# Patient Record
Sex: Female | Born: 1975 | Race: White | Hispanic: No | Smoking: Current every day smoker
Health system: Southern US, Community
[De-identification: ages and names within clinical notes are randomized; demographics above are authoritative.]

## PROBLEM LIST (undated history)

## (undated) ENCOUNTER — Inpatient Hospital Stay (HOSPITAL_COMMUNITY): Payer: Self-pay

## (undated) DIAGNOSIS — K5090 Crohn's disease, unspecified, without complications: Secondary | ICD-10-CM

## (undated) DIAGNOSIS — E1110 Type 2 diabetes mellitus with ketoacidosis without coma: Secondary | ICD-10-CM

## (undated) DIAGNOSIS — F419 Anxiety disorder, unspecified: Secondary | ICD-10-CM

## (undated) DIAGNOSIS — I10 Essential (primary) hypertension: Secondary | ICD-10-CM

## (undated) DIAGNOSIS — G4700 Insomnia, unspecified: Secondary | ICD-10-CM

## (undated) DIAGNOSIS — G629 Polyneuropathy, unspecified: Secondary | ICD-10-CM

## (undated) HISTORY — DX: Anxiety disorder, unspecified: F41.9

## (undated) HISTORY — PX: COLONOSCOPY: SHX174

## (undated) HISTORY — PX: HERNIA REPAIR: SHX51

## (undated) HISTORY — PX: RECTAL SURGERY: SHX760

## (undated) HISTORY — DX: Insomnia, unspecified: G47.00

## (undated) HISTORY — DX: Polyneuropathy, unspecified: G62.9

## (undated) HISTORY — PX: OTHER SURGICAL HISTORY: SHX169

---

## 2000-06-10 ENCOUNTER — Encounter: Payer: Self-pay | Admitting: *Deleted

## 2000-06-10 ENCOUNTER — Inpatient Hospital Stay (HOSPITAL_COMMUNITY): Admission: AD | Admit: 2000-06-10 | Discharge: 2000-06-10 | Payer: Self-pay | Admitting: *Deleted

## 2000-06-26 ENCOUNTER — Encounter: Payer: Self-pay | Admitting: Emergency Medicine

## 2000-06-26 ENCOUNTER — Emergency Department (HOSPITAL_COMMUNITY): Admission: EM | Admit: 2000-06-26 | Discharge: 2000-06-26 | Payer: Self-pay | Admitting: Emergency Medicine

## 2000-07-10 ENCOUNTER — Other Ambulatory Visit: Admission: RE | Admit: 2000-07-10 | Discharge: 2000-07-10 | Payer: Self-pay | Admitting: Obstetrics and Gynecology

## 2000-08-07 ENCOUNTER — Encounter: Payer: Self-pay | Admitting: Obstetrics and Gynecology

## 2000-08-07 ENCOUNTER — Ambulatory Visit (HOSPITAL_COMMUNITY): Admission: RE | Admit: 2000-08-07 | Discharge: 2000-08-07 | Payer: Self-pay | Admitting: Obstetrics and Gynecology

## 2000-10-06 ENCOUNTER — Encounter: Payer: Self-pay | Admitting: Obstetrics and Gynecology

## 2000-10-06 ENCOUNTER — Ambulatory Visit (HOSPITAL_COMMUNITY): Admission: RE | Admit: 2000-10-06 | Discharge: 2000-10-06 | Payer: Self-pay | Admitting: Obstetrics and Gynecology

## 2000-11-14 ENCOUNTER — Inpatient Hospital Stay (HOSPITAL_COMMUNITY): Admission: AD | Admit: 2000-11-14 | Discharge: 2000-11-14 | Payer: Self-pay | Admitting: *Deleted

## 2000-11-24 ENCOUNTER — Ambulatory Visit (HOSPITAL_COMMUNITY): Admission: RE | Admit: 2000-11-24 | Discharge: 2000-11-24 | Payer: Self-pay | Admitting: Obstetrics and Gynecology

## 2000-11-24 ENCOUNTER — Encounter: Payer: Self-pay | Admitting: Obstetrics and Gynecology

## 2000-12-11 ENCOUNTER — Ambulatory Visit (HOSPITAL_COMMUNITY): Admission: RE | Admit: 2000-12-11 | Discharge: 2000-12-11 | Payer: Self-pay | Admitting: Obstetrics and Gynecology

## 2000-12-11 ENCOUNTER — Encounter: Payer: Self-pay | Admitting: Obstetrics and Gynecology

## 2000-12-26 ENCOUNTER — Encounter: Payer: Self-pay | Admitting: Obstetrics and Gynecology

## 2000-12-26 ENCOUNTER — Inpatient Hospital Stay (HOSPITAL_COMMUNITY): Admission: AD | Admit: 2000-12-26 | Discharge: 2000-12-26 | Payer: Self-pay | Admitting: Obstetrics and Gynecology

## 2000-12-29 ENCOUNTER — Inpatient Hospital Stay (HOSPITAL_COMMUNITY): Admission: AD | Admit: 2000-12-29 | Discharge: 2000-12-30 | Payer: Self-pay | Admitting: Obstetrics and Gynecology

## 2001-09-06 ENCOUNTER — Emergency Department (HOSPITAL_COMMUNITY): Admission: EM | Admit: 2001-09-06 | Discharge: 2001-09-06 | Payer: Self-pay | Admitting: Emergency Medicine

## 2001-10-11 ENCOUNTER — Inpatient Hospital Stay (HOSPITAL_COMMUNITY): Admission: AD | Admit: 2001-10-11 | Discharge: 2001-10-11 | Payer: Self-pay | Admitting: *Deleted

## 2001-10-12 ENCOUNTER — Encounter: Payer: Self-pay | Admitting: Obstetrics and Gynecology

## 2001-12-18 ENCOUNTER — Emergency Department (HOSPITAL_COMMUNITY): Admission: EM | Admit: 2001-12-18 | Discharge: 2001-12-18 | Payer: Self-pay | Admitting: Emergency Medicine

## 2001-12-24 ENCOUNTER — Emergency Department (HOSPITAL_COMMUNITY): Admission: EM | Admit: 2001-12-24 | Discharge: 2001-12-24 | Payer: Self-pay | Admitting: Emergency Medicine

## 2001-12-26 ENCOUNTER — Emergency Department (HOSPITAL_COMMUNITY): Admission: EM | Admit: 2001-12-26 | Discharge: 2001-12-26 | Payer: Self-pay | Admitting: Emergency Medicine

## 2002-12-15 ENCOUNTER — Emergency Department (HOSPITAL_COMMUNITY): Admission: EM | Admit: 2002-12-15 | Discharge: 2002-12-15 | Payer: Self-pay | Admitting: Emergency Medicine

## 2003-01-25 ENCOUNTER — Emergency Department (HOSPITAL_COMMUNITY): Admission: EM | Admit: 2003-01-25 | Discharge: 2003-01-26 | Payer: Self-pay | Admitting: Emergency Medicine

## 2003-01-26 ENCOUNTER — Encounter: Payer: Self-pay | Admitting: Emergency Medicine

## 2003-01-29 ENCOUNTER — Emergency Department (HOSPITAL_COMMUNITY): Admission: EM | Admit: 2003-01-29 | Discharge: 2003-01-29 | Payer: Self-pay | Admitting: Emergency Medicine

## 2003-05-26 ENCOUNTER — Inpatient Hospital Stay (HOSPITAL_COMMUNITY): Admission: AD | Admit: 2003-05-26 | Discharge: 2003-05-26 | Payer: Self-pay | Admitting: Obstetrics and Gynecology

## 2004-02-04 ENCOUNTER — Emergency Department (HOSPITAL_COMMUNITY): Admission: EM | Admit: 2004-02-04 | Discharge: 2004-02-04 | Payer: Self-pay | Admitting: *Deleted

## 2004-05-11 ENCOUNTER — Emergency Department (HOSPITAL_COMMUNITY): Admission: EM | Admit: 2004-05-11 | Discharge: 2004-05-11 | Payer: Self-pay | Admitting: Family Medicine

## 2004-06-25 ENCOUNTER — Emergency Department (HOSPITAL_COMMUNITY): Admission: EM | Admit: 2004-06-25 | Discharge: 2004-06-25 | Payer: Self-pay | Admitting: Family Medicine

## 2004-07-01 ENCOUNTER — Emergency Department (HOSPITAL_COMMUNITY): Admission: EM | Admit: 2004-07-01 | Discharge: 2004-07-01 | Payer: Self-pay | Admitting: Family Medicine

## 2004-07-27 ENCOUNTER — Emergency Department (HOSPITAL_COMMUNITY): Admission: EM | Admit: 2004-07-27 | Discharge: 2004-07-27 | Payer: Self-pay | Admitting: Family Medicine

## 2004-10-27 ENCOUNTER — Emergency Department (HOSPITAL_COMMUNITY): Admission: EM | Admit: 2004-10-27 | Discharge: 2004-10-27 | Payer: Self-pay | Admitting: Family Medicine

## 2006-10-16 ENCOUNTER — Emergency Department (HOSPITAL_COMMUNITY): Admission: EM | Admit: 2006-10-16 | Discharge: 2006-10-16 | Payer: Self-pay

## 2008-11-06 ENCOUNTER — Emergency Department (HOSPITAL_COMMUNITY): Admission: EM | Admit: 2008-11-06 | Discharge: 2008-11-06 | Payer: Self-pay | Admitting: Emergency Medicine

## 2008-11-09 ENCOUNTER — Observation Stay (HOSPITAL_COMMUNITY): Admission: EM | Admit: 2008-11-09 | Discharge: 2008-11-10 | Payer: Self-pay | Admitting: Emergency Medicine

## 2009-05-09 ENCOUNTER — Emergency Department (HOSPITAL_COMMUNITY): Admission: EM | Admit: 2009-05-09 | Discharge: 2009-05-09 | Payer: Self-pay | Admitting: Emergency Medicine

## 2009-11-30 ENCOUNTER — Emergency Department (HOSPITAL_COMMUNITY): Admission: EM | Admit: 2009-11-30 | Discharge: 2009-11-30 | Payer: Self-pay | Admitting: Emergency Medicine

## 2010-01-11 ENCOUNTER — Emergency Department (HOSPITAL_COMMUNITY)
Admission: EM | Admit: 2010-01-11 | Discharge: 2010-01-11 | Payer: Self-pay | Source: Home / Self Care | Admitting: Emergency Medicine

## 2010-02-22 ENCOUNTER — Emergency Department (HOSPITAL_COMMUNITY)
Admission: EM | Admit: 2010-02-22 | Discharge: 2010-02-22 | Payer: Self-pay | Source: Home / Self Care | Admitting: Emergency Medicine

## 2010-02-23 ENCOUNTER — Emergency Department (HOSPITAL_COMMUNITY)
Admission: EM | Admit: 2010-02-23 | Discharge: 2010-02-23 | Payer: Self-pay | Source: Home / Self Care | Admitting: Emergency Medicine

## 2010-03-11 ENCOUNTER — Ambulatory Visit: Payer: Self-pay | Admitting: Obstetrics & Gynecology

## 2010-03-11 ENCOUNTER — Inpatient Hospital Stay (HOSPITAL_COMMUNITY): Admission: AD | Admit: 2010-03-11 | Discharge: 2010-03-11 | Payer: Self-pay | Admitting: Obstetrics & Gynecology

## 2010-05-05 ENCOUNTER — Encounter: Admission: RE | Admit: 2010-05-05 | Discharge: 2010-05-05 | Payer: Self-pay | Admitting: Gastroenterology

## 2010-08-03 ENCOUNTER — Emergency Department (HOSPITAL_COMMUNITY)
Admission: EM | Admit: 2010-08-03 | Discharge: 2010-08-04 | Discharge status: Against Medical Advice | Payer: Medicaid Other | Attending: Emergency Medicine | Admitting: Emergency Medicine

## 2010-08-03 DIAGNOSIS — R109 Unspecified abdominal pain: Secondary | ICD-10-CM | POA: Insufficient documentation

## 2010-08-03 DIAGNOSIS — N390 Urinary tract infection, site not specified: Secondary | ICD-10-CM | POA: Insufficient documentation

## 2010-08-03 DIAGNOSIS — K219 Gastro-esophageal reflux disease without esophagitis: Secondary | ICD-10-CM | POA: Insufficient documentation

## 2010-08-03 DIAGNOSIS — Z79899 Other long term (current) drug therapy: Secondary | ICD-10-CM | POA: Insufficient documentation

## 2010-08-03 DIAGNOSIS — R339 Retention of urine, unspecified: Secondary | ICD-10-CM | POA: Insufficient documentation

## 2010-08-03 DIAGNOSIS — K6289 Other specified diseases of anus and rectum: Secondary | ICD-10-CM | POA: Insufficient documentation

## 2010-08-03 DIAGNOSIS — I10 Essential (primary) hypertension: Secondary | ICD-10-CM | POA: Insufficient documentation

## 2010-08-03 DIAGNOSIS — K5090 Crohn's disease, unspecified, without complications: Secondary | ICD-10-CM | POA: Insufficient documentation

## 2010-08-03 DIAGNOSIS — R10819 Abdominal tenderness, unspecified site: Secondary | ICD-10-CM | POA: Insufficient documentation

## 2010-08-03 DIAGNOSIS — K612 Anorectal abscess: Secondary | ICD-10-CM | POA: Insufficient documentation

## 2010-08-04 ENCOUNTER — Encounter (HOSPITAL_COMMUNITY): Payer: Self-pay | Admitting: Radiology

## 2010-08-04 ENCOUNTER — Emergency Department (HOSPITAL_COMMUNITY): Payer: Medicaid Other

## 2010-08-04 ENCOUNTER — Inpatient Hospital Stay (HOSPITAL_COMMUNITY)
Admission: EM | Admit: 2010-08-04 | Discharge: 2010-08-05 | DRG: 348 | Disposition: A | Discharge status: Home / Self Care | Payer: Medicaid Other | Attending: Surgery | Admitting: Surgery

## 2010-08-04 DIAGNOSIS — K612 Anorectal abscess: Principal | ICD-10-CM | POA: Diagnosis present

## 2010-08-04 DIAGNOSIS — K5090 Crohn's disease, unspecified, without complications: Secondary | ICD-10-CM | POA: Diagnosis present

## 2010-08-04 DIAGNOSIS — Z6839 Body mass index (BMI) 39.0-39.9, adult: Secondary | ICD-10-CM

## 2010-08-04 DIAGNOSIS — F17200 Nicotine dependence, unspecified, uncomplicated: Secondary | ICD-10-CM | POA: Diagnosis present

## 2010-08-04 DIAGNOSIS — E119 Type 2 diabetes mellitus without complications: Secondary | ICD-10-CM | POA: Diagnosis present

## 2010-08-04 DIAGNOSIS — I10 Essential (primary) hypertension: Secondary | ICD-10-CM | POA: Diagnosis present

## 2010-08-04 HISTORY — DX: Essential (primary) hypertension: I10

## 2010-08-04 HISTORY — DX: Crohn's disease, unspecified, without complications: K50.90

## 2010-08-04 LAB — GLUCOSE, CAPILLARY
Glucose-Capillary: 194 mg/dL — ABNORMAL HIGH (ref 70–99)
Glucose-Capillary: 126 mg/dL — ABNORMAL HIGH (ref 70–99)
Glucose-Capillary: 168 mg/dL — ABNORMAL HIGH (ref 70–99)

## 2010-08-04 LAB — URINALYSIS, ROUTINE W REFLEX MICROSCOPIC
Bilirubin Urine: NEGATIVE
Leukocytes, UA: NEGATIVE
Nitrite: POSITIVE — AB
Protein, ur: 100 mg/dL — AB
Specific Gravity, Urine: 1.033 — ABNORMAL HIGH (ref 1.005–1.030)
Urine Glucose, Fasting: 250 mg/dL — AB
Urobilinogen, UA: 0.2 mg/dL (ref 0.0–1.0)
pH: 5 (ref 5.0–8.0)

## 2010-08-04 LAB — COMPREHENSIVE METABOLIC PANEL
ALT: 14 U/L (ref 0–35)
AST: 19 U/L (ref 0–37)
Albumin: 4.1 g/dL (ref 3.5–5.2)
Alkaline Phosphatase: 60 U/L (ref 39–117)
BUN: 10 mg/dL (ref 6–23)
CO2: 21 mEq/L (ref 19–32)
Calcium: 9.3 mg/dL (ref 8.4–10.5)
Chloride: 104 mEq/L (ref 96–112)
Creatinine, Ser: 0.77 mg/dL (ref 0.4–1.2)
GFR calc Af Amer: >60 mL/min (ref >60)
GFR calc non Af Amer: >60 mL/min (ref >60)
Glucose, Bld: 185 mg/dL — ABNORMAL HIGH (ref 70–99)
Potassium: 3.6 mEq/L (ref 3.5–5.1)
Sodium: 136 mEq/L (ref 135–145)
Total Bilirubin: 0.8 mg/dL (ref 0.3–1.2)
Total Protein: 7.5 g/dL (ref 6.0–8.3)

## 2010-08-04 LAB — DIFFERENTIAL
Basophils Absolute: 0 10*3/uL (ref 0.0–0.1)
Basophils Relative: 0 % (ref 0–1)
Eosinophils Absolute: 0.1 10*3/uL (ref 0.0–0.7)
Eosinophils Relative: 1 % (ref 0–5)
Lymphocytes Relative: 16 % (ref 12–46)
Lymphs Abs: 2.6 10*3/uL (ref 0.7–4.0)
Monocytes Absolute: 0.9 10*3/uL (ref 0.1–1.0)
Monocytes Relative: 6 % (ref 3–12)
Neutro Abs: 12.3 10*3/uL — ABNORMAL HIGH (ref 1.7–7.7)
Neutrophils Relative %: 77 % (ref 43–77)

## 2010-08-04 LAB — CBC
HCT: 42 % (ref 36.0–46.0)
Hemoglobin: 14.7 g/dL (ref 12.0–15.0)
MCH: 30.6 pg (ref 26.0–34.0)
MCHC: 35 g/dL (ref 30.0–36.0)
MCV: 87.5 fL (ref 78.0–100.0)
Platelets: 241 10*3/uL (ref 150–400)
RBC: 4.8 MIL/uL (ref 3.87–5.11)
RDW: 12.7 % (ref 11.5–15.5)
WBC: 15.9 10*3/uL — ABNORMAL HIGH (ref 4.0–10.5)

## 2010-08-04 LAB — POCT PREGNANCY, URINE: Preg Test, Ur: NEGATIVE

## 2010-08-04 LAB — URINE MICROSCOPIC-ADD ON

## 2010-08-04 MED ORDER — IOHEXOL 300 MG/ML  SOLN
100.0000 mL | Freq: Once | INTRAMUSCULAR | Status: AC | PRN
  Administered 2010-08-04: 100 mL via INTRAVENOUS

## 2010-08-05 LAB — GLUCOSE, CAPILLARY
Glucose-Capillary: 279 mg/dL — ABNORMAL HIGH (ref 70–99)
Glucose-Capillary: 156 mg/dL — ABNORMAL HIGH (ref 70–99)
Glucose-Capillary: 236 mg/dL — ABNORMAL HIGH (ref 70–99)

## 2010-08-05 LAB — CBC
HCT: 37.7 % (ref 36.0–46.0)
Hemoglobin: 12.8 g/dL (ref 12.0–15.0)
MCH: 30.1 pg (ref 26.0–34.0)
MCHC: 34 g/dL (ref 30.0–36.0)
MCV: 88.7 fL (ref 78.0–100.0)
Platelets: 223 10*3/uL (ref 150–400)
RBC: 4.25 MIL/uL (ref 3.87–5.11)
RDW: 12.8 % (ref 11.5–15.5)
WBC: 10 10*3/uL (ref 4.0–10.5)

## 2010-08-05 LAB — HEMOGLOBIN A1C: Mean Plasma Glucose: 180 mg/dL — ABNORMAL HIGH (ref <117)

## 2010-08-06 NOTE — Op Note (Signed)
[name], Mora             ACCOUNT NO.:  [id number]  MEDICAL RECORD NO.:  [id number]           PATIENT TYPE:  I  LOCATION:  1523                         FACILITY:  [facility]  PHYSICIAN:  Sharlet Salina T. Ernesto Zukowski, M.D.DATE OF BIRTH:  06-08-1975  DATE OF PROCEDURE:  08/04/2010 DATE OF DISCHARGE:                              OPERATIVE REPORT   PREOPERATIVE DIAGNOSIS:  Perirectal abscess.  POSTOPERATIVE DIAGNOSIS:  Perirectal abscess.  PROCEDURE:  Transanal drainage, perirectal abscess.  SURGEON:  Lorne Skeens. [name], M.D.  ANESTHESIA:  General.  BRIEF HISTORY:  [name] is a 35 year old female with a many year history of Crohn disease.  She has not been on any recent medications for Crohn's due to lack of funds and has not had recent GI followup. She has a history of several perirectal abscesses which, by her history and 1 previous operative note by Dr. Ezzard Standing in 2010, were present in the rectovaginal septum and had been drained transanally.  She now presents again with a several-day history of increasing pain and pressure in her perineum.  CT scan was done from the emergency room, showing a several centimeter abscess in the rectovaginal septum, several centimeters above the anal verge.  An area of tenderness, swelling, and induration has been confirmed on rectal and vaginal exam.  I have recommended incision and drainage in the operating room.  Risks of bleeding, infection, fistula formation, and recurrence were discussed and understood.  She is now brought to the operating room for this procedure.  DESCRIPTION OF OPERATION:  The patient was brought to the operating room and placed in supine position on the operating table and general endotracheal anesthesia was induced.  She had received preoperative broad-spectrum antibiotics.  She was carefully placed in a lithotomy position and the perineum widely sterilely prepped and draped.  Correct patient and procedure were  verified.  Careful vaginal exam showed swelling and induration at about the junction of the lower one-third and upper two-thirds of the vagina in the posterior midline, but there was no evidence of any fistulization into the vagina.  Rectal retractors were placed and there was noted to be a pinpoint area of purulent drainage in the anterior rectum in the midline, about 3-4 cm from the anal verge.  I placed a hemostat through this opening into the abscess cavity and dilated this and drained a moderate amount of frank purulent material, which was cultured.  This was dilated with a hemostat and then I was able to insert my finger through the rectal opening into the abscess cavity in the rectovaginal septum and this probably measured about 4 cm in diameter.  The site was enlarged slightly with cautery to allow wide drainage and opened this up over a distance of about 1.5 cm to 2 cm.  I broke up any loculations with my finger and completely drained the area.  Hemostasis was obtained with cautery.  A perirectal block was performed with Marcaine with epinephrine.  I then packed the abscess cavity transanally with Iodoform gauze, leaving this out through the anus.  Sponge and needle counts were correct.  Clean dressings were applied.  The patient  was taken to Recovery in good condition.     Lorne Skeens. [name], M.D.     Tory Emerald  D:  08/04/2010  T:  08/05/2010  Job:  [id number]  Electronically Signed by Glenna Fellows M.D. on 08/06/2010 04:39:34 PM

## 2010-08-07 LAB — URINE CULTURE: Culture  Setup Time: 201203010342

## 2010-08-07 LAB — CULTURE, ROUTINE-ABSCESS

## 2010-08-09 LAB — ANAEROBIC CULTURE

## 2010-08-17 NOTE — H&P (Signed)
NAMEARCHIE, [name]             ACCOUNT NO.:  [id number]  MEDICAL RECORD NO.:  [id number]           PATIENT TYPE:  I  LOCATION:  1523                         FACILITY:  [facility]  PHYSICIAN:  Sharlet Salina T. Anderia Lorenzo, M.D.DATE OF BIRTH:  Mar 22, 1976  DATE OF ADMISSION:  08/04/2010 DATE OF DISCHARGE:                             HISTORY & PHYSICAL   PRIMARY CARE PHYSICIAN:  Fleet Contras, MD  GASTROENTEROLOGIST:  Jordan Hawks. Elnoria Howard, MD  CHIEF COMPLAINT:  Rectal pain.  BRIEF HISTORY:  The patient is a 35 year old white female who presented with rectal pain.  She said she has had some discomfort started about a week ago but it got extremely bad yesterday.  Temperature was up to 101 today and it got to the point where she could not take it anymore.  Her fever improved with Tylenol.  She had a similar episode on November 09, 2008, and was admitted and treated by Dr. Ovidio Kin with I&D of abcess.  The current pain was similar to that.  PAST MEDICAL HISTORY: 1. Crohn's disease.  No medications for 2 months secondary to losing     her income. 2. Diabetes with sugars greater than 173. 3. Hypertension. 4. BMI of 35.9.  Her weight is approximately 230.  Height is 64     inches. 5. Anxiety.  PAST SURGICAL HISTORY:  Perirectal abscess.  The last one was November 09, 2008.  She had one in 2004 and at age 3 also.  History of gestational diabetes.  History of T and A.  FAMILY HISTORY:  Mother is living in good health.  Father is deceased with Crohn's.  One brother has a questionable Crohn's.  Two sisters, one with diabetes and one in good health.  SOCIAL HISTORY:  She smokes a 1-1/2 packs a day for the last 14 years. Alcohol is rare.  Drugs:  Denies.  She has 5 children, ages 72, 14, 62, 68 and 52. She is single.  REVIEW OF SYSTEMS:  Fever up to 101 yesterday.  SKIN:  No changes. PSYCH:  Increased anxiety.  The patient notes she lost 20 pounds last year with discontinuing sodas.  CV:  Negative for  headache, dizziness, syncope, stroke or seizure.  PULMONARY:  No orthopnea.  No PND.  No apnea.  Positive for dyspnea on exertion.  Positive wheezing.  Positive for recent URI.  CARDIAC:  Negative for chest pain.  GI: Positive for GERD.  No nausea, vomiting.  Positive for alternating diarrhea and constipation.  No blood in her stool.  GU:  No trouble voiding normally, but it hurts to pee currently.  This started yesterday.  LOWER EXTREMITIES:  No edema.  No claudication. MUSCULOSKELETAL:  Negative.  MEDICATIONS:  She is on Crohn's medications but has been out for 2 months due to insurance loss.  She still has metformin.  She takes Prevacid p.r.n.  ALLERGIES:  CECLOR.  PHYSICAL EXAMINATION:  VITAL SIGNS:  Temperature is 98.9 on admission, blood pressure 138/91, heart rate was 146, respiratory rate was 16. Currently, 35 year old white female with significant pain in the rectovaginal area.  Temperature is still 97.9, heart rate is down  to 88, blood pressure is 110/58, respiratory rate is 20. HEENT:  Head is normocephalic.  Eyes, ears, nose and throat are grossly normal. NECK:  Trachea is in the midline.  Thyroid is not palpable. CHEST:  Clear to auscultation.  Chest wall is nontender. CARDIAC:  Normal S1-S2.  Pulses are +2 and equal. ABDOMEN:  Positive for bowel sounds.  No palpable hepatosplenomegaly. Nontender.  No masses, abscess or hernia. RECTAL:  The patient has external hemorrhoids.  On rectal exam, she has a mass that is about 2 cm wide at the 3 o'clock position on the wall of the rectum.  The vagina was extremely tender to palpation throughout and along all of the vaginal wall.  I could not palpate or feel any abscess digitally within the vaginal vault. LYMPHATICS:  No lymphadenopathy palpated. MUSCULOSKELETAL:  Normal joints.  No problems ambulating. SKIN:  No changes. NEUROLOGIC:  Alert and oriented.  Cranial nerves are grossly intact. PSYCH:  Normal affect.  LABORATORY  DATA:  White count is 15.9, hemoglobin 14.7, hematocrit 42, platelets 241,000.  Sodium is 136, potassium is 3.6, chloride is 104, CO2 is 21, BUN is 10, creatinine is 0.77, glucose 185, total protein 7.5.  LFTs were negative.  Urine was negative for pregnancy.  UA was positive for nitrates.  Wet prep was negative for gonorrhea or Chlamydia.  Wet prep did show some white cells.  CT of the pelvis shows stranding around the rectum.  There is a 3.2 x 2.5 cm fluid collection in the lower rectum with questionable abscess.  There is gas within the vagina with concern for a rectovaginal fistula.  IMPRESSION: 1. 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 2. History of Crohn's disease, currently not treated. 3. Adult-onset diabetes mellitus. 4. Hypertension. 5. Tobacco use. 6. BMI of 39.5.  PLAN:  Dr. Johna Sheriff has seen and examined the patient.  He plans to take her to the OR for incision and drainage of the abscess through the rectum.     Eber Hong, P.A.   ______________________________ Lorne Skeens. Wyn Nettle, M.D.    WDJ/MEDQ  D:  08/04/2010  T:  08/04/2010  Job:  [id number]  cc:   Fleet Contras, M.D. Fax: [phone]  Electronically Signed by Sherrie George P.A. on 08/11/2010 04:55:22 PM Electronically Signed by Glenna Fellows M.D. on 08/17/2010 07:45:27 PM

## 2010-08-18 LAB — WET PREP, GENITAL
Trich, Wet Prep: NONE SEEN
Yeast Wet Prep HPF POC: NONE SEEN

## 2010-08-22 LAB — URINE CULTURE
Colony Count: NO GROWTH
Culture: NO GROWTH

## 2010-08-22 LAB — URINALYSIS, ROUTINE W REFLEX MICROSCOPIC
Ketones, ur: NEGATIVE mg/dL
Leukocytes, UA: NEGATIVE
Nitrite: NEGATIVE
Protein, ur: NEGATIVE mg/dL
Urobilinogen, UA: 0.2 mg/dL (ref 0.0–1.0)

## 2010-08-22 LAB — POCT PREGNANCY, URINE: Preg Test, Ur: NEGATIVE

## 2010-08-22 LAB — URINE MICROSCOPIC-ADD ON

## 2010-09-10 ENCOUNTER — Emergency Department (HOSPITAL_COMMUNITY): Payer: Medicaid Other

## 2010-09-10 ENCOUNTER — Emergency Department (HOSPITAL_COMMUNITY)
Admission: EM | Admit: 2010-09-10 | Discharge: 2010-09-10 | Disposition: A | Discharge status: Home / Self Care | Payer: Medicaid Other | Attending: Emergency Medicine | Admitting: Emergency Medicine

## 2010-09-10 DIAGNOSIS — K5090 Crohn's disease, unspecified, without complications: Secondary | ICD-10-CM | POA: Insufficient documentation

## 2010-09-10 DIAGNOSIS — K612 Anorectal abscess: Secondary | ICD-10-CM | POA: Insufficient documentation

## 2010-09-10 DIAGNOSIS — I10 Essential (primary) hypertension: Secondary | ICD-10-CM | POA: Insufficient documentation

## 2010-09-10 DIAGNOSIS — K219 Gastro-esophageal reflux disease without esophagitis: Secondary | ICD-10-CM | POA: Insufficient documentation

## 2010-09-10 DIAGNOSIS — R509 Fever, unspecified: Secondary | ICD-10-CM | POA: Insufficient documentation

## 2010-09-10 LAB — CBC
MCV: 87.9 fL (ref 78.0–100.0)
Platelets: 259 10*3/uL (ref 150–400)
RDW: 12.4 % (ref 11.5–15.5)
WBC: 11.7 10*3/uL — ABNORMAL HIGH (ref 4.0–10.5)

## 2010-09-10 LAB — POCT I-STAT, CHEM 8
Calcium, Ion: 1.16 mmol/L (ref 1.12–1.32)
Chloride: 102 mEq/L (ref 96–112)
HCT: 44 % (ref 36.0–46.0)
Sodium: 138 mEq/L (ref 135–145)
TCO2: 24 mmol/L (ref 0–100)

## 2010-09-10 LAB — DIFFERENTIAL
Basophils Absolute: 0 10*3/uL (ref 0.0–0.1)
Basophils Relative: 0 % (ref 0–1)
Eosinophils Absolute: 0.2 10*3/uL (ref 0.0–0.7)
Eosinophils Relative: 2 % (ref 0–5)
Lymphs Abs: 3.4 10*3/uL (ref 0.7–4.0)

## 2010-09-10 MED ORDER — IOHEXOL 300 MG/ML  SOLN
100.0000 mL | Freq: Once | INTRAMUSCULAR | Status: AC | PRN
  Administered 2010-09-10: 100 mL via INTRAVENOUS

## 2010-09-13 LAB — ANAEROBIC CULTURE

## 2010-09-13 LAB — CBC
HCT: 40.5 % (ref 36.0–46.0)
Hemoglobin: 13.6 g/dL (ref 12.0–15.0)
Hemoglobin: 14.4 g/dL (ref 12.0–15.0)
MCHC: 34.4 g/dL (ref 30.0–36.0)
MCV: 89.1 fL (ref 78.0–100.0)
MCV: 88.8 fL (ref 78.0–100.0)
RBC: 4.42 MIL/uL (ref 3.87–5.11)
RBC: 4.56 MIL/uL (ref 3.87–5.11)
WBC: 12.6 10*3/uL — ABNORMAL HIGH (ref 4.0–10.5)

## 2010-09-13 LAB — URINALYSIS, ROUTINE W REFLEX MICROSCOPIC
Ketones, ur: 40 mg/dL — AB
Ketones, ur: 15 mg/dL — AB
Leukocytes, UA: NEGATIVE
Nitrite: NEGATIVE
Nitrite: NEGATIVE
Protein, ur: 100 mg/dL — AB
Protein, ur: 100 mg/dL — AB
Urobilinogen, UA: 0.2 mg/dL (ref 0.0–1.0)
pH: 5.5 (ref 5.0–8.0)

## 2010-09-13 LAB — DIFFERENTIAL
Eosinophils Absolute: 0.2 10*3/uL (ref 0.0–0.7)
Eosinophils Absolute: 0.2 10*3/uL (ref 0.0–0.7)
Eosinophils Relative: 2 % (ref 0–5)
Lymphocytes Relative: 13 % (ref 12–46)
Lymphs Abs: 1.8 10*3/uL (ref 0.7–4.0)
Lymphs Abs: 2 10*3/uL (ref 0.7–4.0)
Monocytes Relative: 2 % — ABNORMAL LOW (ref 3–12)
Neutrophils Relative %: 79 % — ABNORMAL HIGH (ref 43–77)
Neutrophils Relative %: 80 % — ABNORMAL HIGH (ref 43–77)

## 2010-09-13 LAB — CULTURE, ROUTINE-ABSCESS

## 2010-09-13 LAB — COMPREHENSIVE METABOLIC PANEL
CO2: 24 mEq/L (ref 19–32)
Calcium: 9 mg/dL (ref 8.4–10.5)
Creatinine, Ser: 0.6 mg/dL (ref 0.4–1.2)
GFR calc non Af Amer: >60 mL/min (ref >60)
Glucose, Bld: 151 mg/dL — ABNORMAL HIGH (ref 70–99)

## 2010-09-13 LAB — WET PREP, GENITAL: Trich, Wet Prep: NONE SEEN

## 2010-09-13 LAB — POCT I-STAT, CHEM 8
Glucose, Bld: 147 mg/dL — ABNORMAL HIGH (ref 70–99)
HCT: 43 % (ref 36.0–46.0)
Hemoglobin: 14.6 g/dL (ref 12.0–15.0)
Potassium: 3.8 mEq/L (ref 3.5–5.1)

## 2010-09-13 LAB — GC/CHLAMYDIA PROBE AMP, GENITAL
Chlamydia, DNA Probe: NEGATIVE
GC Probe Amp, Genital: NEGATIVE

## 2010-09-20 NOTE — Discharge Summary (Signed)
[name], [name]             ACCOUNT NO.:  [id number]  MEDICAL RECORD NO.:  [id number]           PATIENT TYPE:  I  LOCATION:  1523                         FACILITY:  [facility]  PHYSICIAN:  Sandria Bales. Ezzard Standing, M.D.  DATE OF BIRTH:  [birth year]  DATE OF ADMISSION:  08/04/2010 DATE OF DISCHARGE:  08/05/2010                              DISCHARGE SUMMARY   ADMISSION DIAGNOSES: 1. 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 2. History of Crohn's disease, currently off medicines. 3. Adult-onset diabetes mellitus, on metformin. 4. Hypertension. 5. Tobacco use. 6. Body mass index of 39.5.  DISCHARGE DIAGNOSES: 1. 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 2. History of Crohn's disease, currently off medicines. 3. Adult-onset diabetes mellitus, on metformin. 4. Hypertension. 5. Tobacco use. 6. Body mass index of 39.5.  PROCEDURES: 1. Perirectal abscess with transanal drainage.  BRIEF HISTORY:  The patient is a 35 year old female who presented with rectal pain.  She says she had some discomfort that started about a week ago but it got extremely bad yesterday, temperature was up to 101 today and she presented to the ER.  Her fever improved with Tylenol.  She has had similar episodes in the past; last was in June 2010, treated by Dr. Ovidio Kin.  We were asked to see in consultation.  PAST MEDICAL HISTORY: 1. Crohn's disease.  No medications for 2 months secondary to losing     her insurance. 2. Diabetes with sugar greater than 173. 3. Hypertension. 4. BMI of 35.9. 5. Anxiety.  For further history and physical, please see the dictated note.  She smokes a pack and a half per day currently.  MEDICATIONS:  Her medications have been discontinued because of insurance loss.  She also takes metformin which she still has and Prevacid p.r.n.  HOSPITAL COURSE:  The patient was seen in the ER.  She was examined and found to have a mass in the rectum  which was palpable with digital exam.  She had none in the vaginal wall.  She was subsequently taken to the OR where she underwent drainage.  She tolerated this well. She was placed on IV antibiotics and transferred to the floor.  LABORATORY DATA:  She has a hemoglobin A1c of 7.9.  TSH of 0.53. Cultures at this point are not growing anything.  Cocci in pairs were seen on the Gram's stain.  White count was down to 10,000, hemoglobin 12.8, hematocrit 37, platelets 223,000.  Urine microscopic on admission was normal except for some amorphous urate crystals.  Wet prep was negative for yeast, Trichomonas.  Chlamydial and GC probe was negative for both organisms.  WBC on admission was 15.9, hemoglobin 14.7, hematocrit 42, platelets were 241,000.  Urine pregnancy was normal. Urinalysis was normal.  The first postoperative morning, the patient had actually lost her dressing already.  She was started on sitz baths, mobilized and she was anxious for discharge.  During her hospitalization we found out that she was actually a patient Dr. Haywood Pao.  Dr. Elnoria Howard was planning to come see her today but at this point she is ready to go home, and we discussed this  with Dr. Elnoria Howard and he will see her as an outpatient.  We will have the patient call the office for followup appointment tomorrow.  DISCHARGE PLAN: 1. We will plan to discharge her home on Cipro 500 mg p.o. b.i.d. for     7 days. 2. We will plan to see her back in 2 weeks. 3. She is to call Dr. Elnoria Howard for followup next week.  CONDITION ON DISCHARGE:  Improved.  Dictated For:  Lorne Skeens. Hoxworth, MD.  ???   Eber Hong, P.A.   Sandria Bales. Ezzard Standing, M.D., FACS  WDJ/MEDQ  D:  08/05/2010  T:  08/05/2010  Job:  [id number]  cc:   Jordan Hawks. Elnoria Howard, MD Fax: [phone]  Lorne Skeens. Hoxworth, M.D. [address] [place] [address]  Dr. Nada Boozer  Electronically Signed by Sherrie George P.A. on 09/09/2010 07:10:54  AM Electronically Signed by Ovidio Kin M.D. on 09/19/2010 07:40:53 PM

## 2010-10-19 NOTE — H&P (Signed)
[name], [name]             ACCOUNT NO.:  [id number]   MEDICAL RECORD NO.:  [id number]          PATIENT TYPE:  INP   LOCATION:  1536                         FACILITY:  [facility]   PHYSICIAN:  Sandria Bales. Ezzard Standing, M.D.  DATE OF BIRTH:  25-Jan-1976   DATE OF ADMISSION:  11/09/2008  DATE OF DISCHARGE:                              HISTORY & PHYSICAL   Date of Admission - 09 November 2008   HISTORY OF PRESENT ILLNESS:  This is a 35 year old female who goes to  [employer].  She is not sure of the name of the physician she  saw there. She had increasing pain, swelling and tenderness in her  perineum/vagina over the last 9 days.  She went to the [facility]  twice and went to [facility] on Friday, November 07, 2008, then came to  the [facility] emergency room early morning of November 09, 2008.   She has had no prior history of peptic ulcer disease, liver disease,  pancreas disease or colon disease.  She has had five children, her  youngest is 15 years old.  She says she has had some trouble with  hemorrhoids since his birth.  She does have a father who had Crohn  disease. She had no other prior rectal problems other than the  hemorrhoids.   She underwent a CT scan which showed a 3 cm abscess anterior to the  rectum and I was called by Bryna Colander, PA, in the [facility] ER for  consultation.   PAST MEDICAL HISTORY:  She has allergies to CECLOR.  She said this was  when she was a teenager, she had some hives.   She is on no medicines chronically, though she has been given some  medicines for this acute illness.   REVIEW OF SYSTEMS:  NEUROLOGIC:  No seizure or loss of consciousness.  CARDIAC:  She has  no history of heart disease, chest pain,  hypertension.  No prior cardiac evaluation.  PULMONARY:  She smokes a  pack of cigarettes a day.  She knows this is bad for health.  GASTROINTESTINAL: See history present illness.  NEUROLOGIC: No history of kidney stones or kidney infections.  GYN:   Again she is a gravida 5, para 5.  Her last period was about a  week ago.  She said this pain started before her period ended.  She is  not on any kind of birth in birth control right now, she says she is not  sexually active.  She is separated from her husband.   PERSONAL HISTORY:  Her father had Crohn and it sounds like he died from  complications of Crohn disease.  She herself again has 5 children.  She is separated.  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   PHYSICAL EXAMINATION:  Her temperature is 98.7, pulse is 99,  respirations 14, temperature 98.7.  She is a well-nourished if not obese female.  HEENT: Unremarkable.  The mouth shows no obvious oral lesion.  NECK:  Supple.  I feel no mass or thyromegaly.  LUNGS: Clear to  auscultation with symmetric breath sounds.  HEART:  Has regular rate and rhythm without murmur or rub.  BREASTS:  Not examined.  ABDOMEN:  She is obese.  She has no tenderness, no guarding and no  rebound.  RECTUM AND VAGINA:  She has tenderness in the perineum, bulging into the  vagina anteriorly and into the rectum posteriorly. I found no mass or  growth.  EXTREMITIES:  She has good strength in upper lower extremities.  NEUROLOGIC:  Grossly intact.   LABORATORY DATA:  That I have, she has a white count of 14,800,  hemoglobin of 13, hematocrit 39.  Her sodium 138, potassium 3.6,  chloride 104, CO2 of 24, glucose of 151, BUN of 8, creatinine of 0.6.  Liver functions were normal.  Her urinalysis was negative.  I reviewed her CT scan that showed this abscess anterior to the rectum.   IMPRESSION:  1. Perirectal abscess.  I discussed with the patient about going to      the operating room where I could do an exam under anesthesia and      try to drain this abscess.  The risks include bleeding and      recurrence of the abscess.  I don't think it involves the vagina,      but that is a possiblity. This could represent a rather  unusual      origin although I think right now is most likely of rectal origin.  2. Family history of Crohn disease but no current symptoms to suggest      she has Crohn's.  3. Obesity.  4. Smoke cigarettes, knows it is bad for her health.      Sandria Bales. Ezzard Standing, M.D.  Electronically Signed     DHN/MEDQ  D:  11/09/2008  T:  11/10/2008  Job:  [id number]   cc:   Fleet Contras, M.D.  Fax: [phone]

## 2010-10-19 NOTE — Op Note (Signed)
[name], Leslie             ACCOUNT NO.:  [id number]   MEDICAL RECORD NO.:  [id number]          PATIENT TYPE:  INP   LOCATION:  1536                         FACILITY[facility]   PHYSICIAN:  Sandria Bales. Ezzard Standing, M.D.  DATE OF BIRTH:  1975-10-22   DATE OF PROCEDURE:  11/09/2008  DATE OF DISCHARGE:                               OPERATIVE REPORT   Date of Surgery - 09 November 2008   PREOPERATIVE DIAGNOSIS:  Anterior rectal abscess.   POSTOPERATIVE DIAGNOSIS:  Anterior rectal abscess.   PROCEDURES:  Exam under anesthesia with incision and drainage of  anterior rectal abscess.   SURGEON:  Ovidio Kin   FIRST ASSISTANT:  None.   ANESTHESIA:  General   ESTIMATED BLOOD LOSS:  50 mL.   DRAINS LEFT IN:  I did use a Gelfoam pack in the rectum.   INDICATIONS FOR SURGERY:  [name] is a 35 year old female who is a  patient of Dr. Concepcion Elk, who presented with a 9-day history of increasing  perineal and vaginal pain.  A CT scan revealed an anterior rectal  abscess.  The patient comes for incision and drainage of this abscess.   The indications and potential complication were explained to the  patient.  Potential complications include, but are not limited to,  bleeding, vaginal injury, and recurrence of the abscess.   OPERATIVE NOTE:  The patient was placed in lithotomy position and exam  under anesthesia was performed.  Her vaginal wall was somewhat indurated  but had no obvious hole or perforation.  Her perineum looked good.  I  examined her rectum.  She had a tethering of her anterior rectal wall  which I think represented a sinus that went to this abscess.   I prepped her perineum with Betadine solution.  I aspirated about 5 mL  of creamy foul-smelling pus from this abscess which was sent for  cultures and then I made a linear incision into the abscess cavity.  I  made an incision about 1.5 to 2 cm, big enough to get my finger in.  I  do not think it tracked any further than to the  perirectal abscess.   I then to put some dibucaine ointment over a rolled up Gelfoam and  placed it in the rectum for topical pressure and anesthetic.  She  tolerated this part of the procedure well and was transported to the  recovery room in good condition.  I will keep her overnight just because  of her trouble with urinating and for pain control, with plan for  discharge tomorrow.      Sandria Bales. Ezzard Standing, M.D.  Electronically Signed     DHN/MEDQ  D:  11/09/2008  T:  11/10/2008  Job:  [id number]   cc:   Fleet Contras, M.D.  Fax: [phone]
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
## 2010-12-16 ENCOUNTER — Emergency Department (HOSPITAL_COMMUNITY)
Admission: EM | Admit: 2010-12-16 | Discharge: 2010-12-16 | Disposition: A | Discharge status: Home / Self Care | Payer: Medicaid Other | Attending: Emergency Medicine | Admitting: Emergency Medicine

## 2010-12-16 DIAGNOSIS — M545 Low back pain, unspecified: Secondary | ICD-10-CM | POA: Insufficient documentation

## 2010-12-16 DIAGNOSIS — I10 Essential (primary) hypertension: Secondary | ICD-10-CM | POA: Insufficient documentation

## 2010-12-16 DIAGNOSIS — M62838 Other muscle spasm: Secondary | ICD-10-CM | POA: Insufficient documentation

## 2010-12-16 DIAGNOSIS — X500XXA Overexertion from strenuous movement or load, initial encounter: Secondary | ICD-10-CM | POA: Insufficient documentation

## 2010-12-16 DIAGNOSIS — T148XXA Other injury of unspecified body region, initial encounter: Secondary | ICD-10-CM | POA: Insufficient documentation

## 2010-12-16 DIAGNOSIS — Y9229 Other specified public building as the place of occurrence of the external cause: Secondary | ICD-10-CM | POA: Insufficient documentation

## 2016-01-02 ENCOUNTER — Inpatient Hospital Stay (HOSPITAL_COMMUNITY): Payer: Self-pay

## 2016-01-02 ENCOUNTER — Encounter (HOSPITAL_COMMUNITY): Payer: Self-pay | Admitting: *Deleted

## 2016-01-02 ENCOUNTER — Inpatient Hospital Stay (HOSPITAL_COMMUNITY)
Admission: AD | Admit: 2016-01-02 | Discharge: 2016-01-02 | Disposition: A | Discharge status: Home / Self Care | Payer: Self-pay | Source: Ambulatory Visit | Attending: Family Medicine | Admitting: Family Medicine

## 2016-01-02 DIAGNOSIS — O368190 Decreased fetal movements, unspecified trimester, not applicable or unspecified: Secondary | ICD-10-CM

## 2016-01-02 DIAGNOSIS — O99613 Diseases of the digestive system complicating pregnancy, third trimester: Secondary | ICD-10-CM | POA: Insufficient documentation

## 2016-01-02 DIAGNOSIS — O24913 Unspecified diabetes mellitus in pregnancy, third trimester: Secondary | ICD-10-CM | POA: Insufficient documentation

## 2016-01-02 DIAGNOSIS — O368130 Decreased fetal movements, third trimester, not applicable or unspecified: Secondary | ICD-10-CM | POA: Insufficient documentation

## 2016-01-02 DIAGNOSIS — K5090 Crohn's disease, unspecified, without complications: Secondary | ICD-10-CM | POA: Insufficient documentation

## 2016-01-02 DIAGNOSIS — Z3A32 32 weeks gestation of pregnancy: Secondary | ICD-10-CM | POA: Insufficient documentation

## 2016-01-02 DIAGNOSIS — O1403 Mild to moderate pre-eclampsia, third trimester: Secondary | ICD-10-CM

## 2016-01-02 DIAGNOSIS — O163 Unspecified maternal hypertension, third trimester: Secondary | ICD-10-CM | POA: Insufficient documentation

## 2016-01-02 LAB — COMPREHENSIVE METABOLIC PANEL
ALT: 7 U/L — AB (ref 14–54)
AST: 12 U/L — AB (ref 15–41)
Albumin: 2.7 g/dL — ABNORMAL LOW (ref 3.5–5.0)
Alkaline Phosphatase: 59 U/L (ref 38–126)
Anion gap: 9 (ref 5–15)
BILIRUBIN TOTAL: 0.4 mg/dL (ref 0.3–1.2)
BUN: 8 mg/dL (ref 6–20)
CHLORIDE: 107 mmol/L (ref 101–111)
CO2: 19 mmol/L — ABNORMAL LOW (ref 22–32)
Calcium: 8.7 mg/dL — ABNORMAL LOW (ref 8.9–10.3)
Creatinine, Ser: 0.41 mg/dL — ABNORMAL LOW (ref 0.44–1.00)
GFR calc Af Amer: >60 mL/min (ref >60)
GFR calc non Af Amer: >60 mL/min (ref >60)
GLUCOSE: 146 mg/dL — AB (ref 65–99)
POTASSIUM: 3.5 mmol/L (ref 3.5–5.1)
Sodium: 135 mmol/L (ref 135–145)
Total Protein: 6.6 g/dL (ref 6.5–8.1)

## 2016-01-02 LAB — PROTEIN / CREATININE RATIO, URINE
Creatinine, Urine: 184 mg/dL
Protein Creatinine Ratio: 0.53 mg/mg{Cre} — ABNORMAL HIGH (ref 0.00–0.15)
Total Protein, Urine: 97 mg/dL

## 2016-01-02 LAB — CBC WITH DIFFERENTIAL/PLATELET
BASOS ABS: 0 10*3/uL (ref 0.0–0.1)
Basophils Relative: 0 %
EOS PCT: 1 %
Eosinophils Absolute: 0.1 10*3/uL (ref 0.0–0.7)
HEMATOCRIT: 37.1 % (ref 36.0–46.0)
Hemoglobin: 12.9 g/dL (ref 12.0–15.0)
LYMPHS ABS: 2.4 10*3/uL (ref 0.7–4.0)
LYMPHS PCT: 23 %
MCH: 29.8 pg (ref 26.0–34.0)
MCHC: 34.8 g/dL (ref 30.0–36.0)
MCV: 85.7 fL (ref 78.0–100.0)
MONO ABS: 0.3 10*3/uL (ref 0.1–1.0)
Monocytes Relative: 3 %
NEUTROS ABS: 7.8 10*3/uL — AB (ref 1.7–7.7)
Neutrophils Relative %: 73 %
PLATELETS: 221 10*3/uL (ref 150–400)
RBC: 4.33 MIL/uL (ref 3.87–5.11)
RDW: 13.8 % (ref 11.5–15.5)
WBC: 10.5 10*3/uL (ref 4.0–10.5)

## 2016-01-02 NOTE — MAU Note (Signed)
Pt states that since yesterday she has been having decreased fetal movement.  Pt states she is from [place] and goes to the high risk clinic there.  Pt states she is here visiting her daughters.

## 2016-01-02 NOTE — Discharge Instructions (Signed)

## 2016-01-02 NOTE — MAU Provider Note (Signed)
History   [id number] @ 32.3 wks visiting from [place] in with c/o decreased fetal movement. Pt is IDDM and CHTN not on any meds for HTN.  CSN: [id number]  Arrival date & time 01/02/16  1301   None     Chief Complaint  Patient presents with  . low fetal movement    HPI  Past Medical History:  Diagnosis Date  . Crohn's disease (HCC)   . Diabetes mellitus   . Hypertension     Past Surgical History:  Procedure Laterality Date  . chron's disease      History reviewed. No pertinent family history.  Social History  Substance Use Topics  . Smoking status: Former Games developer  . Smokeless tobacco: Never Used  . Alcohol use No    OB History    Gravida Para Term Preterm AB Living   6 5 5          SAB TAB Ectopic Multiple Live Births           5      Review of Systems  Constitutional: Negative.   HENT: Negative.   Eyes: Negative.   Respiratory: Negative.   Cardiovascular: Negative.   Gastrointestinal: Negative.   Endocrine: Negative.   Genitourinary: Negative.   Musculoskeletal: Negative.   Allergic/Immunologic: Negative.   Neurological: Negative.   Hematological: Negative.   Psychiatric/Behavioral: Negative.     Allergies  Ceclor [cefaclor]  Home Medications    BP 130/92   Pulse 90   Temp 98.4 F (36.9 C) (Oral)   Resp 18   LMP 05/20/2015   Physical Exam  Constitutional: She is oriented to person, place, and time. She appears well-developed and well-nourished.  HENT:  Head: Normocephalic.  Eyes: Pupils are equal, round, and reactive to light.  Neck: Normal range of motion.  Cardiovascular: Normal rate, regular rhythm, normal heart sounds and intact distal pulses.   Pulmonary/Chest: Effort normal and breath sounds normal.  Abdominal: Soft. Bowel sounds are normal.  Genitourinary: Vagina normal and uterus normal.  Musculoskeletal: Normal range of motion.  Neurological: She is alert and oriented to person, place, and time. She has normal reflexes.  Skin:  Skin is warm and dry.  Psychiatric: She has a normal mood and affect. Her behavior is normal. Judgment and thought content normal.    MAU Course  Procedures (including critical care time)  Labs Reviewed  CBC WITH DIFFERENTIAL/PLATELET  COMPREHENSIVE METABOLIC PANEL  PROTEIN / CREATININE RATIO, URINE   No results found.   1. Decreased fetal movement       MDM  BPP 8/8. PIH labs normal with exception of P?C ratio of .52 Pt states she must get home to [place] that she has small child starting school and she cannot have a baby here.  Pt is adamant about leaving and going home, discussed risk of leaving, pt given copy of her labs and states she will go straight to hospital in [place]. POC discussed with Dr. Adrian Blackwater pt to be d'cd and to go straight to hosp when she arrives home

## 2016-11-06 ENCOUNTER — Encounter (HOSPITAL_COMMUNITY): Payer: Self-pay

## 2016-11-16 ENCOUNTER — Emergency Department (HOSPITAL_COMMUNITY)
Admission: EM | Admit: 2016-11-16 | Discharge: 2016-11-16 | Disposition: A | Discharge status: Home / Self Care | Payer: Self-pay | Attending: Emergency Medicine | Admitting: Emergency Medicine

## 2016-11-16 ENCOUNTER — Emergency Department (HOSPITAL_COMMUNITY): Payer: Self-pay

## 2016-11-16 ENCOUNTER — Encounter (HOSPITAL_COMMUNITY): Payer: Self-pay | Admitting: *Deleted

## 2016-11-16 DIAGNOSIS — Z7982 Long term (current) use of aspirin: Secondary | ICD-10-CM | POA: Insufficient documentation

## 2016-11-16 DIAGNOSIS — N12 Tubulo-interstitial nephritis, not specified as acute or chronic: Secondary | ICD-10-CM

## 2016-11-16 DIAGNOSIS — E119 Type 2 diabetes mellitus without complications: Secondary | ICD-10-CM | POA: Insufficient documentation

## 2016-11-16 DIAGNOSIS — N10 Acute tubulo-interstitial nephritis: Secondary | ICD-10-CM | POA: Insufficient documentation

## 2016-11-16 DIAGNOSIS — Z79899 Other long term (current) drug therapy: Secondary | ICD-10-CM | POA: Insufficient documentation

## 2016-11-16 DIAGNOSIS — I10 Essential (primary) hypertension: Secondary | ICD-10-CM | POA: Insufficient documentation

## 2016-11-16 DIAGNOSIS — Z794 Long term (current) use of insulin: Secondary | ICD-10-CM | POA: Insufficient documentation

## 2016-11-16 DIAGNOSIS — Z87891 Personal history of nicotine dependence: Secondary | ICD-10-CM | POA: Insufficient documentation

## 2016-11-16 LAB — URINALYSIS, ROUTINE W REFLEX MICROSCOPIC
Bilirubin Urine: NEGATIVE
Glucose, UA: >=500 mg/dL — AB
Ketones, ur: 15 mg/dL — AB
Leukocytes, UA: NEGATIVE
NITRITE: POSITIVE — AB
Protein, ur: 100 mg/dL — AB
SPECIFIC GRAVITY, URINE: 1.01 (ref 1.005–1.030)
pH: 5.5 (ref 5.0–8.0)

## 2016-11-16 LAB — CBC WITH DIFFERENTIAL/PLATELET
BASOS ABS: 0 10*3/uL (ref 0.0–0.1)
BASOS PCT: 0 %
EOS ABS: 0.1 10*3/uL (ref 0.0–0.7)
Eosinophils Relative: 1 %
HCT: 39.3 % (ref 36.0–46.0)
HEMOGLOBIN: 14.1 g/dL (ref 12.0–15.0)
Lymphocytes Relative: 15 %
Lymphs Abs: 1.8 10*3/uL (ref 0.7–4.0)
MCH: 30.2 pg (ref 26.0–34.0)
MCHC: 35.9 g/dL (ref 30.0–36.0)
MCV: 84.2 fL (ref 78.0–100.0)
Monocytes Absolute: 1.1 10*3/uL — ABNORMAL HIGH (ref 0.1–1.0)
Monocytes Relative: 10 %
NEUTROS PCT: 74 %
Neutro Abs: 8.7 10*3/uL — ABNORMAL HIGH (ref 1.7–7.7)
Platelets: 195 10*3/uL (ref 150–400)
RBC: 4.67 MIL/uL (ref 3.87–5.11)
RDW: 12.9 % (ref 11.5–15.5)
WBC: 11.7 10*3/uL — AB (ref 4.0–10.5)

## 2016-11-16 LAB — COMPREHENSIVE METABOLIC PANEL
ALBUMIN: 3.9 g/dL (ref 3.5–5.0)
ALK PHOS: 83 U/L (ref 38–126)
ALT: 16 U/L (ref 14–54)
AST: 18 U/L (ref 15–41)
Anion gap: 11 (ref 5–15)
BUN: 6 mg/dL (ref 6–20)
CALCIUM: 8.8 mg/dL — AB (ref 8.9–10.3)
CO2: 20 mmol/L — AB (ref 22–32)
Chloride: 102 mmol/L (ref 101–111)
Creatinine, Ser: 0.5 mg/dL (ref 0.44–1.00)
GFR calc Af Amer: >60 mL/min (ref >60)
GFR calc non Af Amer: >60 mL/min (ref >60)
GLUCOSE: 307 mg/dL — AB (ref 65–99)
POTASSIUM: 3.6 mmol/L (ref 3.5–5.1)
SODIUM: 133 mmol/L — AB (ref 135–145)
Total Bilirubin: 1.2 mg/dL (ref 0.3–1.2)
Total Protein: 7.3 g/dL (ref 6.5–8.1)

## 2016-11-16 LAB — URINALYSIS, MICROSCOPIC (REFLEX): RBC / HPF: NONE SEEN RBC/hpf (ref 0–5)

## 2016-11-16 LAB — I-STAT BETA HCG BLOOD, ED (MC, WL, AP ONLY): I-stat hCG, quantitative: <5 m[IU]/mL (ref <5)

## 2016-11-16 LAB — I-STAT CG4 LACTIC ACID, ED: LACTIC ACID, VENOUS: 1.19 mmol/L (ref 0.5–1.9)

## 2016-11-16 MED ORDER — MORPHINE SULFATE (PF) 2 MG/ML IV SOLN
4.0000 mg | Freq: Once | INTRAVENOUS | Status: AC
  Administered 2016-11-16: 4 mg via INTRAVENOUS
  Filled 2016-11-16: qty 2

## 2016-11-16 MED ORDER — FLUCONAZOLE 150 MG PO TABS: 150.0000 mg | ORAL_TABLET | Freq: Once | ORAL | 0 refills | Status: AC

## 2016-11-16 MED ORDER — SODIUM CHLORIDE 0.9 % IV BOLUS (SEPSIS)
1000.0000 mL | Freq: Once | INTRAVENOUS | Status: AC
  Administered 2016-11-16: 1000 mL via INTRAVENOUS

## 2016-11-16 MED ORDER — FLUCONAZOLE 150 MG PO TABS
150.0000 mg | ORAL_TABLET | Freq: Once | ORAL | Status: AC
  Administered 2016-11-16: 150 mg via ORAL
  Filled 2016-11-16: qty 1

## 2016-11-16 MED ORDER — IOPAMIDOL (ISOVUE-300) INJECTION 61%
INTRAVENOUS | Status: AC
  Filled 2016-11-16: qty 100

## 2016-11-16 MED ORDER — IOPAMIDOL (ISOVUE-300) INJECTION 61%
100.0000 mL | Freq: Once | INTRAVENOUS | Status: AC | PRN
  Administered 2016-11-16: 100 mL via INTRAVENOUS

## 2016-11-16 MED ORDER — HYDROCODONE-ACETAMINOPHEN 5-325 MG PO TABS: 1.0000 | ORAL_TABLET | Freq: Four times a day (QID) | ORAL | 0 refills | Status: DC | PRN

## 2016-11-16 MED ORDER — CIPROFLOXACIN HCL 500 MG PO TABS: 500.0000 mg | ORAL_TABLET | Freq: Two times a day (BID) | ORAL | 0 refills | Status: DC

## 2016-11-16 MED ORDER — CIPROFLOXACIN IN D5W 400 MG/200ML IV SOLN
400.0000 mg | Freq: Once | INTRAVENOUS | Status: AC
  Administered 2016-11-16: 400 mg via INTRAVENOUS
  Filled 2016-11-16: qty 200

## 2016-11-16 NOTE — ED Provider Notes (Signed)
[facility]-EMERGENCY DEPT Provider Note   CSN: [id number] Arrival date & time: 11/16/16  0743     History   Chief Complaint Chief Complaint  Patient presents with  . Flank Pain    HPI [name] is a 41 y.o. female hx of DM, Crohn's s/p multiple surgeries not currently on meds, HTN here with Left flank pain, fever. Patient states that she had left flank pain for the last 2-3 days. It is progressively getting worse and is associated with some urinary frequency and some mild dysuria. Denies any hematuria. She developed a fever 102 yesterday has been taking Tylenol or Motrin for the fever. She states that she has recurrent UTIs before and had to be admitted previously for that. She has a history of Crohn's disease and multiple surgeries. She states that she has not been able to tolerate medicines for Crohn's disease in the past and is not currently on any medicines for that. Denies any blood in her stool currently   The history is provided by the patient.    Past Medical History:  Diagnosis Date  . Crohn's disease (HCC)   . Diabetes mellitus   . Hypertension     There are no active problems to display for this patient.   Past Surgical History:  Procedure Laterality Date  . chron's disease      OB History    Gravida Para Term Preterm AB Living   6 5 5          SAB TAB Ectopic Multiple Live Births           5       Home Medications    Prior to Admission medications   Medication Sig Start Date End Date Taking? Authorizing Provider  aspirin EC 81 MG tablet Take 81 mg by mouth daily.    [provider]  gabapentin (NEURONTIN) 300 MG capsule Take 300 mg by mouth every morning.    [provider]  gabapentin (NEURONTIN) 600 MG tablet Take 600 mg by mouth at bedtime.    [provider]  insulin aspart (NOVOLOG) 100 UNIT/ML injection Inject 25 Units into the skin 3 (three) times daily with meals.    [provider]  insulin glargine  (LANTUS) 100 unit/mL SOPN Inject 42-82 Units into the skin 2 (two) times daily. Pt uses 42 units in the morning and 82 units at night.    [provider]  Prenatal Vit-Fe Fumarate-FA (PRENATAL MULTIVITAMIN) TABS tablet Take 1 tablet by mouth daily.    [provider]    Family History History reviewed. No pertinent family history.  Social History Social History  Substance Use Topics  . Smoking status: Former Games developer  . Smokeless tobacco: Never Used  . Alcohol use No     Allergies   Ceclor [cefaclor]   Review of Systems Review of Systems  Genitourinary: Positive for flank pain.  All other systems reviewed and are negative.    Physical Exam Updated Vital Signs BP 119/85 (BP Location: Left Arm)   Pulse (!) 118   Temp 98.3 F (36.8 C) (Oral)   Resp 18   Ht 5\' 4"  (1.626 m)   Wt 92.5 kg (204 lb)   LMP 11/16/2016   SpO2 98%   BMI 35.02 kg/m   Physical Exam  Constitutional: She is oriented to person, place, and time.  Uncomfortable   HENT:  Head: Normocephalic.  Mouth/Throat: Oropharynx is clear and moist.  Eyes: EOM are normal. Pupils are  equal, round, and reactive to light.  Neck: Normal range of motion. Neck supple.  Cardiovascular: Normal rate, regular rhythm and normal heart sounds.   Pulmonary/Chest: Effort normal and breath sounds normal. No respiratory distress. She has no wheezes. She has no rales.  Abdominal: Soft. Bowel sounds are normal.  Mild L CVAT. Mild LLQ tenderness, no rebound   Musculoskeletal: Normal range of motion. She exhibits no edema.  Neurological: She is alert and oriented to person, place, and time. No cranial nerve deficit. Coordination normal.  Skin: Skin is warm.  Psychiatric: Her behavior is normal.  Nursing note and vitals reviewed.    ED Treatments / Results  Labs (all labs ordered are listed, but only abnormal results are displayed) Labs Reviewed  CULTURE, BLOOD (ROUTINE X 2)  CULTURE, BLOOD (ROUTINE X 2)    CBC WITH DIFFERENTIAL/PLATELET  COMPREHENSIVE METABOLIC PANEL  URINALYSIS, ROUTINE W REFLEX MICROSCOPIC  I-STAT BETA HCG BLOOD, ED ([facility], [facility], [facility] ONLY)  I-STAT CG4 LACTIC ACID, ED    EKG  EKG Interpretation None       Radiology No results found.  Procedures Procedures (including critical care time)  Medications Ordered in ED Medications  sodium chloride 0.9 % bolus 1,000 mL (not administered)  morphine 2 MG/ML injection 4 mg (not administered)     Initial Impression / Assessment and Plan / ED Course  I have reviewed the triage vital signs and the nursing notes.  Pertinent labs & imaging results that were available during my care of the patient were reviewed by me and considered in my medical decision making (see chart for details).     [name] is a 41 y.o. female here with L flank pain, fever. Tachycardic in the ED. Likely pyelo, less likely renal colic or infected stone. Will get labs, lactate, culture, UA. Will get CT ab/pel and give pain meds, IVF and likely abx.   10:44 AM WBC 11. Lactate nl. UA + nitrate and bacteria. CT showed possible L pyelo with no obvious renal abscess. Pain controlled. Afebrile, doesn't appear septic currently. Blood and urine cultures sent. Has ceclor allergy and loaded with cipro IV. Will dc home with 2 week course of cipro.   Final Clinical Impressions(s) / ED Diagnoses   Final diagnoses:  None    New Prescriptions New Prescriptions   No medications on file     Charlynne Pander, MD 11/16/16 1045

## 2016-11-16 NOTE — Discharge Instructions (Signed)
Take cipro twice daily for 2 weeks for kidney infection.  Take tylenol, motrin for pain.   Take vicodin for severe pain.  Take diflucan if you noticed a yeast infection   See your doctor  Return to ER if you have fever, severe flank pain, vomiting.

## 2016-11-16 NOTE — ED Triage Notes (Signed)
Patient is alert and oriented x4.  She is complaining left flank pain that started a few days ago.  Patient denies any Hx of kidney stones.  Currently she rates her pain 6 of 10.

## 2016-11-18 LAB — URINE CULTURE: Culture: >=100000 — AB

## 2016-11-19 ENCOUNTER — Telehealth: Payer: Self-pay

## 2016-11-19 NOTE — Telephone Encounter (Signed)
Post ED Visit - Positive Culture Follow-up  Culture report reviewed by antimicrobial stewardship pharmacist:  []  Enzo Bi, Pharm.D. []  Celedonio Miyamoto, Pharm.D., BCPS AQ-ID [x]  Garvin Fila, Pharm.D., BCPS []  Georgina Pillion, Pharm.D., BCPS []  [place], [address].D., BCPS, AAHIVP []  Estella Husk, Pharm.D., BCPS, AAHIVP []  Lysle Pearl, PharmD, BCPS []  Casilda Carls, PharmD, BCPS []  Pollyann Samples, PharmD, BCPS  Positive urine culture Treated with Ciprofloxacin, organism sensitive to the same and no further patient follow-up is required at this time.  Jerry Caras 11/19/2016, 11:51 AM

## 2016-11-21 LAB — CULTURE, BLOOD (ROUTINE X 2)
CULTURE: NO GROWTH
CULTURE: NO GROWTH
SPECIAL REQUESTS: ADEQUATE

## 2020-01-25 ENCOUNTER — Other Ambulatory Visit: Payer: Self-pay

## 2020-01-25 ENCOUNTER — Emergency Department (HOSPITAL_COMMUNITY)
Admission: EM | Admit: 2020-01-25 | Discharge: 2020-01-25 | Disposition: A | Discharge status: Home / Self Care | Payer: Medicaid Other | Attending: Emergency Medicine | Admitting: Emergency Medicine

## 2020-01-25 DIAGNOSIS — E119 Type 2 diabetes mellitus without complications: Secondary | ICD-10-CM | POA: Diagnosis not present

## 2020-01-25 DIAGNOSIS — Z87891 Personal history of nicotine dependence: Secondary | ICD-10-CM | POA: Insufficient documentation

## 2020-01-25 DIAGNOSIS — I10 Essential (primary) hypertension: Secondary | ICD-10-CM | POA: Insufficient documentation

## 2020-01-25 DIAGNOSIS — R05 Cough: Secondary | ICD-10-CM | POA: Insufficient documentation

## 2020-01-25 DIAGNOSIS — H66002 Acute suppurative otitis media without spontaneous rupture of ear drum, left ear: Secondary | ICD-10-CM | POA: Insufficient documentation

## 2020-01-25 DIAGNOSIS — H9209 Otalgia, unspecified ear: Secondary | ICD-10-CM | POA: Diagnosis present

## 2020-01-25 MED ORDER — AMOXICILLIN-POT CLAVULANATE 875-125 MG PO TABS: 1.0000 | ORAL_TABLET | Freq: Two times a day (BID) | ORAL | 0 refills | Status: AC

## 2020-01-25 NOTE — ED Triage Notes (Signed)
Patient reports bilateral ear pain starting Thursday.

## 2020-01-25 NOTE — ED Provider Notes (Signed)
[facility]-EMERGENCY DEPT Provider Note   CSN: [id number] Arrival date & time: 01/25/20  4270     History Chief Complaint  Patient presents with  . Otalgia    [name] is a 44 y.o. female with history of Crohn's disease, diabetes, hypertension presents today for evaluation of acute onset, persistent and progressively worsening ear pain for 2 days.  Worse on the left side compared to the right.  Also notes some sinus pressure and scratchy throat.  She has had a mild cough persistently for 6 weeks after her entire family got sick.  Denies chest pain or shortness of breath.  She got a rapid Covid test yesterday at a pharmacy which was negative.  She has been trying over-the-counter medications with a little bit of relief.  No fevers at home.  Her 47-year-old son has similar symptoms.  She is fully vaccinated against Covid.   The history is provided by the patient.       Past Medical History:  Diagnosis Date  . Crohn's disease (HCC)   . Diabetes mellitus   . Hypertension     There are no problems to display for this patient.   Past Surgical History:  Procedure Laterality Date  . chron's disease       OB History    Gravida  6   Para  5   Term  5   Preterm      AB      Living        SAB      TAB      Ectopic      Multiple      Live Births  5           No family history on file.  Social History   Tobacco Use  . Smoking status: Former Games developer  . Smokeless tobacco: Never Used  Substance Use Topics  . Alcohol use: No  . Drug use: No    Home Medications Prior to Admission medications   Medication Sig Start Date End Date Taking? Authorizing Provider  acetaminophen (TYLENOL) 500 MG tablet Take 1,500 mg by mouth every 4 (four) hours as needed for mild pain, moderate pain, fever or headache.    [provider]  amoxicillin-clavulanate (AUGMENTIN) 875-125 MG tablet Take 1 tablet by mouth every 12 (twelve) hours for  7 days. 01/25/20 02/01/20  Michela Pitcher A, PA-C  ciprofloxacin (CIPRO) 500 MG tablet Take 1 tablet (500 mg total) by mouth 2 (two) times daily. One po bid x 14 days 11/16/16   Charlynne Pander, MD  fentaNYL (DURAGESIC - DOSED MCG/HR) 50 MCG/HR Place 50 mcg onto the skin every 3 (three) days as needed (for pain).    [provider]  HYDROcodone-acetaminophen (NORCO/VICODIN) 5-325 MG tablet Take 1 tablet by mouth every 6 (six) hours as needed. 11/16/16   Charlynne Pander, MD  insulin glargine (LANTUS) 100 unit/mL SOPN Inject 40-80 Units into the skin 2 (two) times daily. Takes 40 units in the morning and 80 units at night    [provider]  insulin lispro (HUMALOG) 100 UNIT/ML injection Inject 20 Units into the skin 3 (three) times daily with meals.    [provider]  lisinopril (PRINIVIL,ZESTRIL) 5 MG tablet Take 5 mg by mouth daily.    [provider]    Allergies    Ceclor [cefaclor]  Review of Systems   Review of Systems  Constitutional: Negative for chills and  fever.  HENT: Positive for sinus pressure and sore throat ("scratchy"). Negative for trouble swallowing.   Respiratory: Positive for cough. Negative for shortness of breath.   Cardiovascular: Negative for chest pain.  Gastrointestinal: Negative for nausea and vomiting.    Physical Exam Updated Vital Signs BP (!) 143/95 (BP Location: Right Arm)   Pulse (!) 105   Temp 97.9 F (36.6 C) (Oral)   Resp 18   Ht 5\' 3"  (1.6 m)   Wt 85.7 kg   SpO2 99%   BMI 33.48 kg/m   Physical Exam Vitals and nursing note reviewed.  Constitutional:      General: She is not in acute distress.    Appearance: She is well-developed.     Comments: Resting comfortably in chair  HENT:     Head: Normocephalic and atraumatic.     Right Ear: Tympanic membrane and ear canal normal.     Ears:     Comments: Mild tenderness on tugging the left pinna.  No mastoid tenderness.  Left TM is erythematous and bulging.  No  drainage noted.  Right TM without erythema or bulging.    Mouth/Throat:     Mouth: Mucous membranes are moist.     Comments: Tonsils surgically absent.  Uvula is midline.  No erythema.  Tolerating secretions without difficulty. Eyes:     General:        Right eye: No discharge.        Left eye: No discharge.     Conjunctiva/sclera: Conjunctivae normal.  Neck:     Vascular: No JVD.     Trachea: No tracheal deviation.  Cardiovascular:     Rate and Rhythm: Normal rate and regular rhythm.  Pulmonary:     Effort: Pulmonary effort is normal.     Breath sounds: Normal breath sounds.  Abdominal:     General: There is no distension.  Musculoskeletal:     Cervical back: Neck supple. No rigidity or tenderness.  Skin:    General: Skin is warm.     Findings: No erythema.  Neurological:     Mental Status: She is alert.  Psychiatric:        Behavior: Behavior normal.     ED Results / Procedures / Treatments   Labs (all labs ordered are listed, but only abnormal results are displayed) Labs Reviewed - No data to display  EKG None  Radiology No results found.  Procedures Procedures (including critical care time)  Medications Ordered in ED Medications - No data to display  ED Course  I have reviewed the triage vital signs and the nursing notes.  Pertinent labs & imaging results that were available during my care of the patient were reviewed by me and considered in my medical decision making (see chart for details).    MDM Rules/Calculators/A&P                          NUR RABOLD was evaluated in Emergency Department on 01/25/2020 for the symptoms described in the history of present illness. She was evaluated in the context of the global COVID-19 pandemic, which necessitated consideration that the patient might be at risk for infection with the SARS-CoV-2 virus that causes COVID-19. Institutional protocols and algorithms that pertain to the evaluation of patients at risk  for COVID-19 are in a state of rapid change based on information released by regulatory bodies including the CDC and federal and state organizations. These policies and  algorithms were followed during the patient's care in the ED.  Patient presenting for evaluation of ear pain, sinus pressure.  Has had a cough for 6 weeks but has tested negative for Covid yesterday.  She is fully vaccinated.  Son has similar symptoms.  On examination left tympanic membrane is erythematous and bulging.  No evidence of malignant otitis externa, or mastoiditis.  She is speaking in full sentences, no signs of respiratory distress.  She is afebrile in the ED.  Clinically she is well-appearing. Concern for otitis media.  Will cover with oral Augmentin. Has tolerated amoxicillin in the past.  Will provide referral to [facility] of wellness for outpatient follow-up.  Discussed strict ED return precautions.  Patient verbalized understanding of and agreement with plan and is stable for discharge at this time.   Final Clinical Impression(s) / ED Diagnoses Final diagnoses:  Acute suppurative otitis media of left ear without spontaneous rupture of tympanic membrane, recurrence not specified    Rx / DC Orders ED Discharge Orders         Ordered    amoxicillin-clavulanate (AUGMENTIN) 875-125 MG tablet  Every 12 hours        01/25/20 0828           Jeanie Sewer, PA-C 01/25/20 1638    Terald Sleeper, MD 01/25/20 [phone]

## 2020-01-25 NOTE — Discharge Instructions (Signed)
Please take all of your antibiotics until finished!   Take your antibiotics with food.  Common side effects of antibiotics include nausea, vomiting, abdominal discomfort, and diarrhea. You may help offset some of this with probiotics which you can buy or get in yogurt. Do not eat  or take the probiotics until 2 hours after your antibiotic.    Some studies suggest that certain antibiotics can reduce the efficacy of certain oral contraceptive pills (birth control), so please use additional contraceptives (condoms or other barrier method) while you are taking the antibiotics and for an additional 5 to 7 days afterwards if you are a female on these medications.  You can continue to use over-the-counter medications as necessary.   You can alternate between ibuprofen and Tylenol as needed for aches pains and fevers.  Drink plenty fluids and get rest.  I have given you the information for [facility], primary care facility specifically for people who do not have health insurance.  Please call to schedule follow-up appointment and let them know you were referred from the emergency department.

## 2020-01-25 NOTE — ED Notes (Signed)
Patient insisted on leaving ED due to her son not wanting to keep his mask on. Unable to obtain vitals per patient request.

## 2020-02-04 ENCOUNTER — Ambulatory Visit: Payer: PRIVATE HEALTH INSURANCE | Attending: Internal Medicine

## 2020-02-04 DIAGNOSIS — Z23 Encounter for immunization: Secondary | ICD-10-CM

## 2020-02-04 NOTE — Progress Notes (Signed)
   Covid-19 Vaccination Clinic  Name:  RITIKA HELLICKSON    MRN: [id number] DOB: Jan 10, 1976  02/04/2020  Ms. Ruttan was observed post Covid-19 immunization for 15 minutes without incident. She was provided with Vaccine Information Sheet and instruction to access the V-Safe system.   Ms. Ambrose was instructed to call 911 with any severe reactions post vaccine: Marland Kitchen Difficulty breathing  . Swelling of face and throat  . A fast heartbeat  . A bad rash all over body  . Dizziness and weakness

## 2020-02-28 ENCOUNTER — Encounter: Payer: Self-pay | Admitting: Gastroenterology

## 2020-03-02 ENCOUNTER — Emergency Department (HOSPITAL_COMMUNITY): Payer: Medicaid Other

## 2020-03-02 ENCOUNTER — Encounter (HOSPITAL_COMMUNITY): Payer: Self-pay | Admitting: Emergency Medicine

## 2020-03-02 ENCOUNTER — Emergency Department (HOSPITAL_COMMUNITY)
Admission: EM | Admit: 2020-03-02 | Discharge: 2020-03-02 | Disposition: A | Discharge status: Home / Self Care | Payer: Medicaid Other | Attending: Emergency Medicine | Admitting: Emergency Medicine

## 2020-03-02 DIAGNOSIS — Z79899 Other long term (current) drug therapy: Secondary | ICD-10-CM | POA: Diagnosis not present

## 2020-03-02 DIAGNOSIS — M5441 Lumbago with sciatica, right side: Secondary | ICD-10-CM

## 2020-03-02 DIAGNOSIS — E119 Type 2 diabetes mellitus without complications: Secondary | ICD-10-CM | POA: Insufficient documentation

## 2020-03-02 DIAGNOSIS — Z87891 Personal history of nicotine dependence: Secondary | ICD-10-CM | POA: Diagnosis not present

## 2020-03-02 DIAGNOSIS — I10 Essential (primary) hypertension: Secondary | ICD-10-CM | POA: Insufficient documentation

## 2020-03-02 DIAGNOSIS — Z794 Long term (current) use of insulin: Secondary | ICD-10-CM | POA: Insufficient documentation

## 2020-03-02 DIAGNOSIS — M545 Low back pain: Secondary | ICD-10-CM | POA: Diagnosis present

## 2020-03-02 MED ORDER — NAPROXEN 500 MG PO TABS: 500.0000 mg | ORAL_TABLET | Freq: Two times a day (BID) | ORAL | 0 refills | Status: DC

## 2020-03-02 MED ORDER — KETOROLAC TROMETHAMINE 60 MG/2ML IM SOLN
60.0000 mg | Freq: Once | INTRAMUSCULAR | Status: AC
  Administered 2020-03-02: 08:00:00 60 mg via INTRAMUSCULAR
  Filled 2020-03-02: qty 2

## 2020-03-02 MED ORDER — TIZANIDINE HCL 4 MG PO CAPS: 4.0000 mg | ORAL_CAPSULE | Freq: Three times a day (TID) | ORAL | 0 refills | Status: DC

## 2020-03-02 NOTE — ED Provider Notes (Signed)
[facility]-EMERGENCY DEPT Provider Note   CSN: [id number] Arrival date & time: 03/02/20  7672     History Chief Complaint  Patient presents with  . Fall    [name] is a 44 y.o. female.  HPI 44 year old female with history of Crohn's disease, DM type II on insulin, hypertension presents to the ER with complaints of low back pain after a fall last week.  Patient states that she was walking down her apartment complex steps and missed a step and fell down 3 steps.  She states she landed on her back.  Denies any head injury or LOC.  Has had low back pain since the fall, seen by her PCP, taking Robaxin and Flexeril together, Tylenol and ibuprofen.  She presents to the ER for second opinion.  She denies any numbness or tingling in her lower extremities, no groin numbness, no foot drop, no loss of bowel bladder control.  Describes the pain as a shooting pain that runs down the front and back of her right leg and sometimes into her groin.  She has had difficulty ambulating due to the back pain.  She also feels like she has developed a knot in her low back which was not there previously.  She states that she was referred to a pain management clinic, but has not followed up with them.  She states that with her history of Crohn's disease, she has some for 20 mg prednisone's which she has been tapering over the last few days.  Denies any history of cancer, night sweats, unintended weight loss, IV drug use.    Past Medical History:  Diagnosis Date  . Crohn's disease (HCC)   . Diabetes mellitus   . Hypertension     There are no problems to display for this patient.   Past Surgical History:  Procedure Laterality Date  . chron's disease       OB History    Gravida  6   Para  5   Term  5   Preterm      AB      Living        SAB      TAB      Ectopic      Multiple      Live Births  5           History reviewed. No pertinent family  history.  Social History   Tobacco Use  . Smoking status: Former Games developer  . Smokeless tobacco: Never Used  Substance Use Topics  . Alcohol use: No  . Drug use: No    Home Medications Prior to Admission medications   Medication Sig Start Date End Date Taking? Authorizing Provider  acetaminophen (TYLENOL) 500 MG tablet Take 1,500 mg by mouth every 4 (four) hours as needed for mild pain, moderate pain, fever or headache.    [provider]  ciprofloxacin (CIPRO) 500 MG tablet Take 1 tablet (500 mg total) by mouth 2 (two) times daily. One po bid x 14 days 11/16/16   Charlynne Pander, MD  fentaNYL (DURAGESIC - DOSED MCG/HR) 50 MCG/HR Place 50 mcg onto the skin every 3 (three) days as needed (for pain).    [provider]  HYDROcodone-acetaminophen (NORCO/VICODIN) 5-325 MG tablet Take 1 tablet by mouth every 6 (six) hours as needed. 11/16/16   Charlynne Pander, MD  insulin glargine (LANTUS) 100 unit/mL SOPN Inject 40-80 Units into the skin 2 (two) times daily.  Takes 40 units in the morning and 80 units at night    [provider]  insulin lispro (HUMALOG) 100 UNIT/ML injection Inject 20 Units into the skin 3 (three) times daily with meals.    [provider]  lisinopril (PRINIVIL,ZESTRIL) 5 MG tablet Take 5 mg by mouth daily.    [provider]  naproxen (NAPROSYN) 500 MG tablet Take 1 tablet (500 mg total) by mouth 2 (two) times daily. 03/02/20   Mare Ferrari, PA-C  tiZANidine (ZANAFLEX) 4 MG capsule Take 1 capsule (4 mg total) by mouth 3 (three) times daily. 03/02/20   Mare Ferrari, PA-C    Allergies    Ceclor [cefaclor]  Review of Systems   Review of Systems  Constitutional: Negative for chills and fever.  Musculoskeletal: Positive for back pain and gait problem. Negative for neck pain and neck stiffness.  Skin: Negative for rash.  Neurological: Negative for weakness and numbness.    Physical Exam Updated Vital Signs BP 117/86 (BP  Location: Right Arm)   Pulse 86   Temp 98.4 F (36.9 C) (Oral)   Resp 17   Ht 5\' 3"  (1.6 m)   Wt 86.2 kg   LMP 08/03/2019 (Approximate)   SpO2 100%   BMI 33.66 kg/m   Physical Exam Vitals and nursing note reviewed.  Constitutional:      General: She is not in acute distress.    Appearance: She is well-developed. She is not ill-appearing or diaphoretic.  HENT:     Head: Normocephalic and atraumatic.  Eyes:     Conjunctiva/sclera: Conjunctivae normal.  Cardiovascular:     Rate and Rhythm: Normal rate and regular rhythm.     Pulses: Normal pulses.     Heart sounds: Normal heart sounds. No murmur heard.   Pulmonary:     Effort: Pulmonary effort is normal. No respiratory distress.     Breath sounds: Normal breath sounds.  Abdominal:     Palpations: Abdomen is soft.     Tenderness: There is no abdominal tenderness.  Musculoskeletal:        General: Tenderness present. No deformity or signs of injury.     Cervical back: Neck supple.     Right lower leg: No edema.     Left lower leg: No edema.     Comments: Midline tenderness to the LSPINE, small raised mass lateral to the [facility] on the right.No C, T spine tenderness.  5/5 strength in upper and lower extremities.  No noticeable step-offs, crepitus, fluctuance, erythema.  Sensations intact.  Full range of motion and strength of neck. Uncomfortable appearing but able to ambulate in the ED    Small raised mass just lateral to the [facility] on the right. No overlying erythema, fluctuance, mildly tender to palpation.  Skin:    General: Skin is warm and dry.     Findings: No erythema or rash.  Neurological:     General: No focal deficit present.     Mental Status: She is alert and oriented to person, place, and time.     Sensory: No sensory deficit.     Motor: No weakness.  Psychiatric:        Mood and Affect: Mood normal.        Behavior: Behavior normal.     ED Results / Procedures / Treatments   Labs (all labs ordered are  listed, but only abnormal results are displayed) Labs Reviewed - No data to display  EKG None  Radiology DG Lumbar Spine Complete  Result Date: 03/02/2020 CLINICAL DATA:  Low back and lower extremity pain after fall. EXAM: LUMBAR SPINE - COMPLETE 4+ VIEW COMPARISON:  01/30/2010 and CT abdomen pelvis from 11/16/2016 FINDINGS: There is no evidence of lumbar spine fracture. Alignment is normal. Mild disc space height loss at L4-L5, unchanged. No spondylolisthesis. Intervertebral disc spaces otherwise are maintained. The visualized abdominal contents are within normal limits. IMPRESSION: No acute osseous abnormality of the lumbar spine. Unchanged mild discogenic degenerative changes of L4-L5. Electronically Signed   By: Marliss Coots MD   On: 03/02/2020 08:30    Procedures Procedures (including critical care time)  Medications Ordered in ED Medications  ketorolac (TORADOL) injection 60 mg (60 mg Intramuscular Given 03/02/20 2671)    ED Course  I have reviewed the triage vital signs and the nursing notes.  Pertinent labs & imaging results that were available during my care of the patient were reviewed by me and considered in my medical decision making (see chart for details).    MDM Rules/Calculators/A&P                           Patient presents to the ER for a second opinion after a fall.  Patient hypertensive with a blood pressure 155/101 and tachycardic with a pulse of 119, however the patient denies any chest pain, shortness of breath, or symptoms and I suspect that this is secondary to pain.  Physical exam with a small knot just lateral to her L-spine, with some midline tenderness.  She is able to move all 4 extremities not difficulty, no loss of bowel bladder control, no concern for cauda equina, no fever, night sweats, weight loss, history of cancer or IVDU.  I offered the patient an x-ray of her low back as she states that she had no imaging of her back after her fall.  She is  agreeable to this.  We will give a shot of Toradol here.  Patient does have diabetes and is already self trialed a course of steroids, so we will hold on steroid pack.  Will provide a shot of Toradol here.  Likely will refer to neurosurgery for further evaluation and treatment.  Plain films are evidence of acute pathology.  Some degenerative changes seen.  Patient is currently sitting in the ED with a heat pack, received Toradol, notes mild improvement in pain.  Vitals overall improved after pain medicine.  No neurological deficits and normal neuro exam.  Patient can still walk here in the ED, though it is painful.  No loss of bowel bladder control.  No concern for cauda equina.  No fever, night sweats, weight loss, history of cancer, IVDU.  Provided naproxen and Zanaflex for symptom management.  Patient denies any chest pain or shortness of breath, doubt this is a cardiac pathology or dissection.  Return precautions discussed.  Will refer to neurosurgery for further evaluation.  All the patient's questions have been answered to her satisfaction, she voices understanding and is agreeable to this plan.  At this stage in the ED course, the patient has been medically screened and is stable for discharge.  Final Clinical Impression(s) / ED Diagnoses Final diagnoses:  Acute right-sided low back pain with right-sided sciatica    Rx / DC Orders ED Discharge Orders         Ordered    naproxen (NAPROSYN) 500 MG tablet  2 times daily  03/02/20 0816    tiZANidine (ZANAFLEX) 4 MG capsule  3 times daily        03/02/20 0839           Mare Ferrari, PA-C 03/02/20 1607    Lorre Nick, MD 03/03/20 [phone]

## 2020-03-02 NOTE — ED Triage Notes (Addendum)
Pt reports a fall down 3 steps last week. She reports continued back pain and a knot in her back. She saw her PCP, but would like a second opinion. Ambulatory.

## 2020-03-02 NOTE — ED Notes (Signed)
Pt discharged from this ED in stable condition at this time. All discharge instructions and follow up care reviewed with pt with no further questions at this time. Pt ambulatory with steady gait, clear speech.  

## 2020-03-02 NOTE — Discharge Instructions (Addendum)
Thank you for letting me take care of you in the ER today  Your work-up was overall reassuring.  Please  take the Naproxen for pain as directed.  Do not take both the methocarbamol and cyclobenzaprine together as they are both muscle relaxers and are very sedating. I have provided you your own script of a muscle relaxer called Zanaflex. Please take this at night and do not drink or drive on this medication as  it can be sedating. Please take one of the other. Apply heat/ice to your low back, depending what feels better. Use the low back exercises for gentle stretching.  Please call [facility] to schedule an appointment for further evaluation.  I have provided their phone number in your discharge paperwork

## 2020-04-24 ENCOUNTER — Ambulatory Visit: Payer: PRIVATE HEALTH INSURANCE | Admitting: Gastroenterology

## 2020-05-14 ENCOUNTER — Ambulatory Visit: Payer: PRIVATE HEALTH INSURANCE | Admitting: Nurse Practitioner

## 2020-07-17 DIAGNOSIS — E785 Hyperlipidemia, unspecified: Secondary | ICD-10-CM | POA: Insufficient documentation

## 2020-07-17 DIAGNOSIS — I10 Essential (primary) hypertension: Secondary | ICD-10-CM | POA: Insufficient documentation

## 2020-09-25 ENCOUNTER — Ambulatory Visit: Payer: PRIVATE HEALTH INSURANCE | Admitting: Internal Medicine

## 2020-09-25 NOTE — Progress Notes (Deleted)
Name: [name]  MRN/ DOB: [id number], September 12, 1975   Age/ Sex: 45 y.o., female    PCP: [facility]   Reason for Endocrinology Evaluation: Type 2 Diabetes Mellitus     Date of Initial Endocrinology Visit: 09/25/2020     PATIENT IDENTIFIER: [name] is a 45 y.o. female with a past medical history of T2DM, Dyslipidemia and HTN . The patient presented for initial endocrinology clinic visit on 09/25/2020 for consultative assistance with her diabetes management.    HPI: [name] was    Diagnosed with DM *** Prior Medications tried/Intolerance: *** Currently checking blood sugars *** x / day,  before breakfast and ***.  Hypoglycemia episodes : ***               Symptoms: ***                 Frequency: ***/  Hemoglobin A1c has ranged from *** in ***, peaking at *** in ***. Patient required assistance for hypoglycemia:  Patient has required hospitalization within the last 1 year from hyper or hypoglycemia:   In terms of diet, the patient ***   She has hx of pyelonephritis as well as candidiasis   HOME DIABETES REGIMEN: Metformin 500 mg  Lantus  Humalog      Statin: yea ACE-I/ARB: yes Prior Diabetic Education: {Yes/No:11203}   METER DOWNLOAD SUMMARY: Date range evaluated: *** Fingerstick Blood Glucose Tests = *** Average Number Tests/Day = *** Overall Mean FS Glucose = *** Standard Deviation = ***  BG Ranges: Low = *** High = ***   Hypoglycemic Events/30 Days: BG < 50 = *** Episodes of symptomatic severe hypoglycemia = ***   DIABETIC COMPLICATIONS: Microvascular complications:   Neuropathy  Denies: ***  Last eye exam: Completed   Macrovascular complications:   ***  Denies: CAD, PVD, CVA   PAST HISTORY: Past Medical History:  Past Medical History:  Diagnosis Date  . Anxiety   . Crohn's disease (HCC)   . Diabetes mellitus   . Hypertension   . Insomnia   . Neuropathy     Past Surgical History:  Past  Surgical History:  Procedure Laterality Date  . RECTAL SURGERY        Social History:  reports that she has quit smoking. She has never used smokeless tobacco. She reports that she does not drink alcohol and does not use drugs. Family History:  Family History  Problem Relation Age of Onset  . Heart disease Father       HOME MEDICATIONS: Allergies as of 09/25/2020      Reactions   Ceclor [cefaclor] Hives      Medication List       Accurate as of September 25, 2020 12:36 PM. If you have any questions, ask your nurse or doctor.        acetaminophen 500 MG tablet Commonly known as: TYLENOL Take 1,500 mg by mouth every 4 (four) hours as needed for mild pain, moderate pain, fever or headache.   ciprofloxacin 500 MG tablet Commonly known as: Cipro Take 1 tablet (500 mg total) by mouth 2 (two) times daily. One po bid x 14 days   fentaNYL 50 MCG/HR Commonly known as: DURAGESIC Place 50 mcg onto the skin every 3 (three) days as needed (for pain).   HYDROcodone-acetaminophen 5-325 MG tablet Commonly known as: NORCO/VICODIN Take 1 tablet by mouth every 6 (six) hours as needed.   insulin glargine 100 unit/mL Sopn Commonly  known as: LANTUS Inject 40-80 Units into the skin 2 (two) times daily. Takes 40 units in the morning and 80 units at night   insulin lispro 100 UNIT/ML injection Commonly known as: HUMALOG Inject 20 Units into the skin 3 (three) times daily with meals.   lisinopril 5 MG tablet Commonly known as: ZESTRIL Take 5 mg by mouth daily.   naproxen 500 MG tablet Commonly known as: NAPROSYN Take 1 tablet (500 mg total) by mouth 2 (two) times daily.   tiZANidine 4 MG capsule Commonly known as: Zanaflex Take 1 capsule (4 mg total) by mouth 3 (three) times daily.        ALLERGIES: Allergies  Allergen Reactions  . Ceclor [Cefaclor] Hives     REVIEW OF SYSTEMS: A comprehensive ROS was conducted with the patient and is negative except as per HPI and below:   ROS    OBJECTIVE:   VITAL SIGNS: There were no vitals taken for this visit.   PHYSICAL EXAM:  General: Pt appears well and is in NAD  Hydration: Well-hydrated with moist mucous membranes and good skin turgor  HEENT: Head: Unremarkable with good dentition. Oropharynx clear without exudate.  Eyes: External eye exam normal without stare, lid lag or exophthalmos.  EOM intact.  PERRL.  Neck: General: Supple without adenopathy or carotid bruits. Thyroid: Thyroid size normal.  No goiter or nodules appreciated. No thyroid bruit.  Lungs: Clear with good BS bilat with no rales, rhonchi, or wheezes  Heart: RRR with normal S1 and S2 and no gallops; no murmurs; no rub  Abdomen: Normoactive bowel sounds, soft, nontender, without masses or organomegaly palpable  Extremities:  Lower extremities - No pretibial edema. No lesions.  Skin: Normal texture and temperature to palpation. No rash noted. No Acanthosis nigricans/skin tags. No lipohypertrophy.  Neuro: MS is good with appropriate affect, pt is alert and Ox3    DM foot exam:    DATA REVIEWED: 07/17/2020 A1c 12.2%  BUN/Cr 9/0.58 GFR 112 Ca 9.9 Alb/Cr ratio 326  HDL 40 Tg 359 LDL 123   ASSESSMENT / PLAN / RECOMMENDATIONS:   1) Type 2 Diabetes Mellitus, ***controlled, With neuropathic complications and microalbuminuria  - Most recent A1c of ** %. Goal A1c < 7.0 %.    Plan: GENERAL:  ***  MEDICATIONS:  ***  EDUCATION / INSTRUCTIONS:  BG monitoring instructions: Patient is instructed to check her blood sugars *** times a day, ***.  Call [facility] if: BG persistently < 70  . I reviewed the Rule of 15 for the treatment of hypoglycemia in detail with the patient. Literature supplied.   2) Diabetic complications:   Eye: Does *** have known diabetic retinopathy.   Neuro/ Feet: Does *** have known diabetic peripheral neuropathy.  Renal: Patient does *** have known baseline CKD. She is *** on an ACEI/ARB at  present.Check urine albumin/creatinine ratio yearly starting at time of diagnosis. If albuminuria is positive, treatment is geared toward better glucose, blood pressure control and use of ACE inhibitors or ARBs. Monitor electrolytes and creatinine once to twice yearly.   3) Lipids: Patient is *** on a statin.    4) Hypertension: ***  at goal of < 140/90 mmHg.       Signed electronically by: Lyndle Herrlich, MD  [facility]  [facility] [address] [place] [place], [place] [address] Phone: [phone] FAX: [phone]   CC: [facility] [address] [place] [address]  Phone: [phone]  Fax: [phone]    Return to Endocrinology clinic as below: Future Appointments  Date Time Provider Department Center  09/25/2020  2:20 PM Klark Vanderhoef, Konrad Dolores, MD [id number] None

## 2020-10-19 NOTE — Progress Notes (Signed)
Name: [name]  MRN/ DOB: [id number], [birth year]   Age/ Sex: 46 y.o., female    PCP: [facility]   Reason for Endocrinology Evaluation: Type 2 Diabetes Mellitus     Date of Initial Endocrinology Visit: 10/20/2020     PATIENT IDENTIFIER: [name] is a 44 y.o. female with a past medical history of T2DM, Dyslipidemia, Crohn's disease and HTN . The patient presented for initial endocrinology clinic visit on 10/20/2020 for consultative assistance with her diabetes management.   Moved from [place] in 2021   HPI: [name] was    Diagnosed with DM started as gestational diabetes at age 50. At age 51 started Metformin, started insulin in her 23's.  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 Anxiety   . Crohn's disease (HCC)    . Diabetes mellitus   . Hypertension   . Insomnia   . Neuropathy    Past Surgical History:  Past Surgical History:  Procedure Laterality Date  . RECTAL SURGERY        Social History:  reports that she has been smoking. She has never used smokeless tobacco. She reports that she does not drink alcohol and does not use drugs. Family History:  Family History  Problem Relation Age of Onset  . Heart disease Father      HOME MEDICATIONS: Allergies as of 10/20/2020      Reactions   Ceclor [cefaclor] Hives      Medication List       Accurate as of Oct 20, 2020  9:43 AM. If you have any questions, ask your nurse or doctor.        STOP taking these medications   acetaminophen 500 MG tablet Commonly known as: TYLENOL Stopped by: Scarlette Shorts, MD   ciprofloxacin 500 MG tablet Commonly known as: Cipro Stopped by: Scarlette Shorts, MD   fentaNYL 50 MCG/HR Commonly known as: DURAGESIC Stopped by: Scarlette Shorts, MD   HYDROcodone-acetaminophen 5-325 MG tablet Commonly known as: NORCO/VICODIN Stopped by: Scarlette Shorts, MD   naproxen 500 MG tablet Commonly known as: NAPROSYN Stopped by: Scarlette Shorts, MD   tiZANidine 4 MG capsule Commonly known as: Zanaflex  Stopped by: Scarlette Shorts, MD     TAKE these medications   Accu-Chek Guide test strip Generic drug: glucose blood USE 1 STRIP TWICE DAILY   atorvastatin 40 MG tablet Commonly known as: LIPITOR   escitalopram 20 MG tablet Commonly known as: LEXAPRO Take 1 tablet by mouth daily.   gabapentin 300 MG capsule Commonly known as: NEURONTIN gabapentin 300 mg capsule  TAKE 3 CAPSULES BY MOUTH EVERY 6 HOURS   insulin lispro 100 UNIT/ML KwikPen Commonly known as: HUMALOG   Lantus SoloStar 100 UNIT/ML Solostar Pen Generic drug: insulin glargine Lantus Solostar U-100 Insulin 100 unit/mL (3 mL) subcutaneous pen  Inject 45 units every day by subcutaneous route at bedtime.    lisinopril 10 MG tablet Commonly known as: ZESTRIL Take 10 mg by mouth daily.   metFORMIN 500 MG tablet Commonly known as: GLUCOPHAGE metformin 500 mg tablet  TAKE 1 TABLET BY MOUTH TWICE DAILY   zolpidem 10 MG tablet Commonly known as: AMBIEN Take 10 mg by mouth at bedtime as needed.        ALLERGIES: Allergies  Allergen Reactions  . Ceclor [Cefaclor] Hives     REVIEW OF SYSTEMS: A comprehensive ROS was conducted with the patient and is negative except as per HPI and below:  Review of Systems  Eyes: Positive for blurred vision.  Gastrointestinal: Positive for diarrhea. Negative for nausea.       Due to crohn's disease  Neurological: Positive for tingling.      OBJECTIVE:   VITAL SIGNS: BP 136/82   Pulse 99   Ht 5\' 3"  (1.6 m)   Wt 191 lb 2 oz (86.7 kg)   SpO2 98%   BMI 33.86 kg/m    PHYSICAL EXAM:  General: Pt appears well and is in NAD  Hydration: Well-hydrated with moist mucous membranes and good skin turgor  HEENT: Head: Unremarkable with good dentition. Oropharynx clear without exudate.  Eyes: External eye exam normal without stare, lid lag or exophthalmos.  EOM intact.  PERRL.  Neck: General: Supple without adenopathy or carotid bruits. Thyroid: Thyroid size normal.  No goiter or nodules appreciated. No thyroid bruit.  Lungs: Clear with good BS bilat with no rales, rhonchi, or wheezes  Heart: RRR with normal S1 and S2 and no gallops; no murmurs; no rub  Abdomen: Normoactive bowel sounds, soft, nontender, without masses or organomegaly palpable  Extremities:  Lower extremities - No pretibial edema. No lesions.  Skin: Normal texture and temperature to palpation. No rash noted. No Acanthosis nigricans/skin tags. No lipohypertrophy.  Neuro: MS is good with appropriate affect, pt is alert and Ox3    DM foot exam: 10/20/2020  The skin of the feet is  without sores or ulcerations. The pedal pulses are 2+ on right and 2+ on left. The sensation is  decreased to a screening 5.07, 10 gram monofilament bilaterally   DATA REVIEWED: 07/17/2020 A1c 12.2%  BUN/Cr 9/0.58 GFR 112 Ca 9.9 Alb/Cr ratio 326  HDL 40 Tg 359 LDL 123   ASSESSMENT / PLAN / RECOMMENDATIONS:   1) Type 2 Diabetes Mellitus, Poorly controlled, With neuropathic complications and microalbuminuria  - Most recent A1c of 12.8 %. Goal A1c < 7.0 %.    Plan: GENERAL: I have discussed with the patient the pathophysiology of diabetes. We went over the natural progression of the disease. We talked about both insulin resistance and insulin deficiency. We stressed the importance of lifestyle changes including diet and exercise. I explained the complications associated  with diabetes including retinopathy, nephropathy, neuropathy as well as increased risk of cardiovascular disease. We went over the benefit seen with glycemic control.    I explained to the patient that diabetic patients are at higher than normal risk for amputations.   Poorly controlled diabetes due to medication non-adherence and dietary indiscretion, she has not had insulin in ~ 2 months   She has crohn's disease , will try and avoid any medications that could cause GI side effects   She agreed to try SGLT-2 inhibitors, cautioned against Genital side effects   Will prescribe Dexcom   MEDICATIONS: - Continue Metformin 500 mg , 1 tablet Twice daily  - Start Farxiga 5 mg, 1 tablet in the morning  - Start Tresiba 20 units once daily  - Humalog 6 units with each meal  - CF : Humalog ( BG- 130/40)   EDUCATION / INSTRUCTIONS:  BG monitoring instructions: Patient is instructed to check her blood sugars 3 times a day, before meals .  Call [facility] if: BG persistently < 70  . I reviewed the Rule of 15 for the treatment of hypoglycemia in detail with the patient. Literature supplied.   2) Diabetic complications:   Eye: Does not have known diabetic retinopathy.   Neuro/ Feet: Does  have  known diabetic peripheral neuropathy.  Renal: Patient does not have known baseline CKD. She is not on an ACEI/ARB at present.   3) Dyslipidemia: Patient is on a statin. LDL above goal. Discussed cardiolvascular benefits and encouraged compliance. If LDL remains above goal, will consider Zetia    Medication Continue atorvastatin 40 mg daily    F/U in 3 months    Signed electronically by: Lyndle Herrlich, MD  [facility] End[facility]  [facility] [address] [place], [place] [address] Phone: [phone] FAX: [phone]   CC: [facility] [address] [place] [address] Phone: [phone]  Fax: [phone]    Return to Endocrinology clinic as below: No future appointments.

## 2020-10-20 ENCOUNTER — Encounter: Payer: Self-pay | Admitting: Internal Medicine

## 2020-10-20 ENCOUNTER — Ambulatory Visit (INDEPENDENT_AMBULATORY_CARE_PROVIDER_SITE_OTHER): Payer: Medicaid Other | Admitting: Internal Medicine

## 2020-10-20 ENCOUNTER — Other Ambulatory Visit: Payer: Self-pay

## 2020-10-20 VITALS — BP 136/82 | HR 99 | Ht 63.0 in | Wt 191.1 lb

## 2020-10-20 DIAGNOSIS — E785 Hyperlipidemia, unspecified: Secondary | ICD-10-CM

## 2020-10-20 DIAGNOSIS — E1142 Type 2 diabetes mellitus with diabetic polyneuropathy: Secondary | ICD-10-CM

## 2020-10-20 DIAGNOSIS — E1165 Type 2 diabetes mellitus with hyperglycemia: Secondary | ICD-10-CM | POA: Diagnosis not present

## 2020-10-20 DIAGNOSIS — Z794 Long term (current) use of insulin: Secondary | ICD-10-CM

## 2020-10-20 DIAGNOSIS — E119 Type 2 diabetes mellitus without complications: Secondary | ICD-10-CM | POA: Insufficient documentation

## 2020-10-20 LAB — POCT GLYCOSYLATED HEMOGLOBIN (HGB A1C): Hemoglobin A1C: 12.8 % — AB (ref 4.0–5.6)

## 2020-10-20 LAB — POCT GLUCOSE (DEVICE FOR HOME USE): POC Glucose: 357 mg/dl — AB (ref 70–99)

## 2020-10-20 MED ORDER — DAPAGLIFLOZIN PROPANEDIOL 5 MG PO TABS: 5.0000 mg | ORAL_TABLET | Freq: Every day | ORAL | 6 refills | Status: DC

## 2020-10-20 MED ORDER — TRESIBA FLEXTOUCH 100 UNIT/ML ~~LOC~~ SOPN: 20.0000 [IU] | PEN_INJECTOR | Freq: Every day | SUBCUTANEOUS | 11 refills | Status: DC

## 2020-10-20 MED ORDER — INSULIN LISPRO (1 UNIT DIAL) 100 UNIT/ML (KWIKPEN): PEN_INJECTOR | SUBCUTANEOUS | 6 refills | Status: DC

## 2020-10-20 MED ORDER — INSULIN PEN NEEDLE 32G X 4 MM MISC: 1.0000 | Freq: Four times a day (QID) | 3 refills | Status: DC

## 2020-10-20 MED ORDER — DEXCOM G6 SENSOR MISC: 1.0000 | 3 refills | Status: DC

## 2020-10-20 MED ORDER — DEXCOM G6 TRANSMITTER MISC: 1.0000 | 3 refills | Status: DC

## 2020-10-20 NOTE — Patient Instructions (Addendum)
-   Continue Metformin 500 mg , 1 tablet Twice daily  - Start Farxiga 5 mg, 1 tablet in the morning  - Start Tresiba 20 units once daily  - Humalog 6 units with each meal  - Humalog correctional insulin: ADD extra units on insulin to your meal-time Humalog dose if your blood sugars are higher than 170. Use the scale below to help guide you:   Blood sugar before meal Number of units to inject  Less than 170 0 unit  171 -  210 1 units  211 -  250 2 units  251 -  290 3 units  291 -  330 4 units  331 -  370 5 units  371 -  410 6 units  411 -  450 7 units     HOW TO TREAT LOW BLOOD SUGARS (Blood sugar LESS THAN 70 MG/DL)  Please follow the RULE OF 15 for the treatment of hypoglycemia treatment (when your (blood sugars are less than 70 mg/dL)    STEP 1: Take 15 grams of carbohydrates when your blood sugar is low, which includes:   3-4 GLUCOSE TABS  OR  3-4 OZ OF JUICE OR REGULAR SODA OR  ONE TUBE OF GLUCOSE GEL     STEP 2: RECHECK blood sugar in 15 MINUTES STEP 3: If your blood sugar is still low at the 15 minute recheck --> then, go back to STEP 1 and treat AGAIN with another 15 grams of carbohydrates.

## 2021-01-08 ENCOUNTER — Encounter: Payer: Self-pay | Admitting: Gastroenterology

## 2021-01-26 ENCOUNTER — Telehealth: Payer: Self-pay | Admitting: Pharmacy Technician

## 2021-01-26 ENCOUNTER — Other Ambulatory Visit: Payer: Self-pay

## 2021-01-26 ENCOUNTER — Telehealth: Payer: Self-pay

## 2021-01-26 ENCOUNTER — Other Ambulatory Visit (HOSPITAL_COMMUNITY): Payer: Self-pay

## 2021-01-26 ENCOUNTER — Encounter: Payer: Self-pay | Admitting: Internal Medicine

## 2021-01-26 ENCOUNTER — Ambulatory Visit (INDEPENDENT_AMBULATORY_CARE_PROVIDER_SITE_OTHER): Payer: Medicaid Other | Admitting: Internal Medicine

## 2021-01-26 VITALS — BP 114/78 | HR 96 | Ht 63.0 in | Wt 202.4 lb

## 2021-01-26 DIAGNOSIS — E1142 Type 2 diabetes mellitus with diabetic polyneuropathy: Secondary | ICD-10-CM | POA: Diagnosis not present

## 2021-01-26 LAB — BASIC METABOLIC PANEL
BUN: 8 mg/dL (ref 6–23)
CO2: 23 mEq/L (ref 19–32)
Calcium: 8.9 mg/dL (ref 8.4–10.5)
Chloride: 99 mEq/L (ref 96–112)
Creatinine, Ser: 0.48 mg/dL (ref 0.40–1.20)
GFR: 115 mL/min (ref >60.00)
Glucose, Bld: 226 mg/dL — ABNORMAL HIGH (ref 70–99)
Potassium: 4.4 mEq/L (ref 3.5–5.1)
Sodium: 133 mEq/L — ABNORMAL LOW (ref 135–145)

## 2021-01-26 LAB — POCT GLYCOSYLATED HEMOGLOBIN (HGB A1C): Hemoglobin A1C: 12 % — AB (ref 4.0–5.6)

## 2021-01-26 MED ORDER — DAPAGLIFLOZIN PROPANEDIOL 10 MG PO TABS: 10.0000 mg | ORAL_TABLET | Freq: Every day | ORAL | 1 refills | Status: DC

## 2021-01-26 MED ORDER — ACCU-CHEK GUIDE VI STRP: 1.0000 | ORAL_STRIP | Freq: Four times a day (QID) | 3 refills | Status: AC

## 2021-01-26 MED ORDER — ACCU-CHEK GUIDE ME W/DEVICE KIT: 1.0000 | PACK | Freq: Four times a day (QID) | 0 refills | Status: AC

## 2021-01-26 NOTE — Telephone Encounter (Signed)
Okay; thanks.

## 2021-01-26 NOTE — Telephone Encounter (Signed)
Pt has an rx for a Dexcom from May and needs PA. Thanks

## 2021-01-26 NOTE — Patient Instructions (Addendum)
-   Continue Metformin 500 mg , 1 tablet Twice daily  - Increase Farxiga to 10  mg, 1 tablet in the morning  - Continue Tresiba/lantus 50 units once daily  - Continue Humalog 20 units with each meal     HOW TO TREAT LOW BLOOD SUGARS (Blood sugar LESS THAN 70 MG/DL) Please follow the RULE OF 15 for the treatment of hypoglycemia treatment (when your (blood sugars are less than 70 mg/dL)   STEP 1: Take 15 grams of carbohydrates when your blood sugar is low, which includes:  3-4 GLUCOSE TABS  OR 3-4 OZ OF JUICE OR REGULAR SODA OR ONE TUBE OF GLUCOSE GEL    STEP 2: RECHECK blood sugar in 15 MINUTES STEP 3: If your blood sugar is still low at the 15 minute recheck --> then, go back to STEP 1 and treat AGAIN with another 15 grams of carbohydrates.

## 2021-01-26 NOTE — Addendum Note (Signed)
Addended by: CREFT, Feliberto Harts on: 01/26/2021 11:38 AM   Modules accepted: Orders

## 2021-01-26 NOTE — Progress Notes (Signed)
Name: [name]  Age/ Sex: 45 y.o., female   MRN/ DOB: [id number], 05-Apr-1976     PCP: [facility]   Reason for Endocrinology Evaluation: Type 2 Diabetes Mellitus  Initial Endocrine Consultative Visit: 10/20/2020    PATIENT IDENTIFIER: [name] is a 45 y.o. female with a past medical history of T2DM, Dyslipidemia, Crohn's disease and HTN. The patient has followed with Endocrinology clinic since 10/20/2020 for consultative assistance with management of her diabetes.  DIABETIC HISTORY:  [name] was diagnosed with DM as gestational diabetes at age 28, by age 73 she was started on Metformin . Insulin was started in her 30's. Her hemoglobin A1c has ranged from 7.9% in 2012, peaking at 12.8% in 2022  ON her initial visit to our clinic  she had an A1c of 12.8 % . She had Metformin, lantus and Humalog on her list but had not taken insulin in 2 months by then. We continued Metformin, started [place] and restarted insulin .  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  She checks her blood sugars occasionally. The patient has not had hypoglycemic episodes since the last clinic visit. She did not bring the meter today   She has not received her dexcom   Grandmother passed away 2 weeks, admits to dietary indiscretions , has not been taking Humalog consistently    HOME DIABETES REGIMEN:  Metformin 500 mg , 1 tablet Twice daily  Farxiga 5 mg, 1 tablet in the morning  Tresiba 20 units once daily - taking 50 units  Humalog 6 units with each meal - taking 20 units  CF : Humalog ( BG-  130/40)      Statin: yes ACE-I/ARB: yes Prior Diabetic Education: yes   METER DOWNLOAD SUMMARY: Did not bring     DIABETIC COMPLICATIONS: Microvascular complications:  neuropathy Denies: CKD, retinopathy Last Eye Exam: Completed 10/2020  Macrovascular complications:   Denies: CAD, CVA, PVD   HISTORY:  Past Medical History:  Past Medical History:  Diagnosis Date   Anxiety    Crohn's disease ([place])    Diabetes mellitus    Hypertension    Insomnia    Neuropathy    Past Surgical History:  Past Surgical History:  Procedure Laterality Date   RECTAL SURGERY     Social History:  reports that she has been smoking. She has never used smokeless tobacco. She reports that she does not drink alcohol and does not use drugs. Family History:  Family History  Problem Relation Age of Onset   Heart disease Father      HOME MEDICATIONS: Allergies as of 01/26/2021       Reactions   Ceclor [cefaclor] Hives        Medication List        Accurate as of January 26, 2021 11:02 AM. If you have any questions, ask your nurse or doctor.          Accu-Chek Guide Me w/Device Kit 1 Device by Does not apply route in the morning, at noon, in the evening, and at  bedtime. Started by: Dorita Sciara, MD   Accu-Chek Guide test strip Generic drug: glucose blood 1 each by Other route in the morning, at noon, in the evening, and at bedtime. Use as instructed What changed: See the new instructions. Changed by: Dorita Sciara, MD   atorvastatin 40 MG tablet Commonly known as: LIPITOR   dapagliflozin propanediol 10 MG Tabs tablet Commonly known as: Farxiga Take 1 tablet (10 mg total) by mouth daily before breakfast. What changed:  medication strength how much to take Changed by: Dorita Sciara, MD   Dexcom G6 Sensor Misc 1 Device by Does not apply route as directed.   Dexcom G6 Transmitter Misc 1 Device by Does not apply route as directed.   escitalopram  20 MG tablet Commonly known as: LEXAPRO Take 1 tablet by mouth daily.   gabapentin 300 MG capsule Commonly known as: NEURONTIN gabapentin 300 mg capsule  TAKE 3 CAPSULES BY MOUTH EVERY 6 HOURS   insulin lispro 100 UNIT/ML KwikPen Commonly known as: HumaLOG KwikPen Max daily 50 units   Insulin Pen Needle 32G X 4 MM Misc 1 Device by Does not apply route in the morning, at noon, in the evening, and at bedtime.   lisinopril 10 MG tablet Commonly known as: ZESTRIL Take 10 mg by mouth daily.   metFORMIN 500 MG tablet Commonly known as: GLUCOPHAGE metformin 500 mg tablet  TAKE 1 TABLET BY MOUTH TWICE DAILY   Tresiba FlexTouch 100 UNIT/ML FlexTouch Pen Generic drug: insulin degludec Inject 20 Units into the skin daily.   zolpidem 10 MG tablet Commonly known as: AMBIEN Take 10 mg by mouth at bedtime as needed.         OBJECTIVE:   Vital Signs: BP 114/78   Pulse 96   Ht 5' 3"  (1.6 m)   Wt 202 lb 6.4 oz (91.8 kg)   SpO2 99%   BMI 35.85 kg/m   Wt Readings from Last 3 Encounters:  01/26/21 202 lb 6.4 oz (91.8 kg)  10/20/20 191 lb 2 oz (86.7 kg)  03/02/20 190 lb (86.2 kg)     Exam: General: Pt appears well and is in NAD  Lungs: Clear with good BS bilat with no rales, rhonchi, or wheezes  Heart: RRR with normal S1 and S2 and no gallops; no murmurs; no rub  Abdomen: Normoactive bowel sounds, soft, nontender, without masses or organomegaly palpable  Extremities: No pretibial edema.   Neuro: MS is good with appropriate affect, pt is alert and Ox3   DM foot exam: 10/20/2020   The skin of the feet is  without sores or ulcerations. The pedal pulses are 2+ on right and 2+ on left. The sensation is decreased to a screening 5.07, 10 gram monofilament bilaterally        DATA REVIEWED:  Lab Results  Component Value Date   HGBA1C 12.8 (A) 10/20/2020   HGBA1C (H) 08/05/2010    07/17/2020 A1c 12.2%  BUN/Cr 9/0.58 GFR 112 Ca 9.9 Alb/Cr ratio 326  HDL 40 Tg  359 LDL 123    ASSESSMENT / PLAN / RECOMMENDATIONS:   1) Type 2 Diabetes Mellitus, Poorly controlled, With neuropathic complications and microabuminuria - Most recent A1c of 12.0 %. Goal A1c < 7.0 %.    Her A1c is still at 12.0% , despite supposedly taking 50 units of basal insulin and 20 units of prandial insulin with meals, I personally doubt that she is taking any insulin as there's no way her insulin  will remain at 12.0% despite adding an addition 60 units of insulin a day  ( on her last visit she stated she was not taking any )  I again advised her to bring her meter on next visit  She did not get the dexcom, she tells me the pharmacy did not have the prescription but after my assistant spoke to them today it appears they needs prior auth.  She is on much higher doses of insulin then previously discussed, will not change any today due to lack of glucose data I am going to increase [place]  She has crohn's disease , will try and avoid any medications that could cause GI side effects  Unfortunately , if the pt is NOT going to take her medications as prescribed there's not much I can provide other then advise and counseling about the importance of glycemic control  Pt to have BMP drawn today      MEDICATIONS: Continue Metformin 500 mg , 1 tablet Twice daily  Increase Farxiga to 10  mg, 1 tablet in the morning  Continue Tresiba/lantus 50 units once daily  Continue Humalog 20 units with each meal    EDUCATION / INSTRUCTIONS: BG monitoring instructions: Patient is instructed to check her blood sugars 3 times a day, before each meal . Call [facility] if: BG persistently < 70  I reviewed the Rule of 15 for the treatment of hypoglycemia in detail with the patient. Literature supplied.   2) Diabetic complications:  Eye: Does not have known diabetic retinopathy.  Neuro/ Feet: Does have known diabetic peripheral neuropathy .  Renal: Patient does not have known baseline  CKD. She   is not on an ACEI/ARB at present.     3) Dyslipidemia: Patient is on a statin. LDL above goal. Discussed cardiolvascular benefits and encouraged compliance. If LDL remains above goal, will consider Zetia      Medication Continue atorvastatin 40 mg daily     F/U in 4 months     Signed electronically by: Mack Guise, MD  [facility] Endocrinology  [facility] [address] [place], [place] [address] Phone: [phone] FAX: [phone]   CC: [facility] [address] [place] [address] Phone: [phone]  Fax: [phone]  Return to Endocrinology clinic as below: Future Appointments  Date Time Provider [place]  02/26/2021  1:30 PM Ladene Artist, MD [facility]

## 2021-01-26 NOTE — Telephone Encounter (Addendum)
Patient Advocate Encounter   Received notification from CMA that prior authorization for Dexcom is required.   PA submitted on 01/26/21 Key [id number] Status is approved through 07/29/21  Sherilyn Dacosta, CPhT Patient Advocate [facility] Phone: [phone] Fax:  [phone]

## 2021-01-28 ENCOUNTER — Other Ambulatory Visit (HOSPITAL_COMMUNITY): Payer: Self-pay

## 2021-01-28 ENCOUNTER — Encounter: Payer: Self-pay | Admitting: Internal Medicine

## 2021-02-01 NOTE — Telephone Encounter (Signed)
PA  was approved  for Dexcom

## 2021-02-22 ENCOUNTER — Other Ambulatory Visit: Payer: Self-pay

## 2021-02-22 ENCOUNTER — Observation Stay (HOSPITAL_COMMUNITY)
Admission: EM | Admit: 2021-02-22 | Discharge: 2021-02-22 | Discharge status: Against Medical Advice | Payer: Medicaid Other | Attending: Internal Medicine | Admitting: Internal Medicine

## 2021-02-22 ENCOUNTER — Inpatient Hospital Stay (HOSPITAL_COMMUNITY): Payer: Medicaid Other

## 2021-02-22 ENCOUNTER — Encounter (HOSPITAL_COMMUNITY): Payer: Self-pay | Admitting: Emergency Medicine

## 2021-02-22 DIAGNOSIS — Z8249 Family history of ischemic heart disease and other diseases of the circulatory system: Secondary | ICD-10-CM

## 2021-02-22 DIAGNOSIS — R1110 Vomiting, unspecified: Secondary | ICD-10-CM | POA: Diagnosis present

## 2021-02-22 DIAGNOSIS — Z8711 Personal history of peptic ulcer disease: Secondary | ICD-10-CM

## 2021-02-22 DIAGNOSIS — Z20822 Contact with and (suspected) exposure to covid-19: Secondary | ICD-10-CM | POA: Insufficient documentation

## 2021-02-22 DIAGNOSIS — E785 Hyperlipidemia, unspecified: Secondary | ICD-10-CM | POA: Diagnosis present

## 2021-02-22 DIAGNOSIS — Z794 Long term (current) use of insulin: Secondary | ICD-10-CM | POA: Diagnosis not present

## 2021-02-22 DIAGNOSIS — G4700 Insomnia, unspecified: Secondary | ICD-10-CM | POA: Diagnosis present

## 2021-02-22 DIAGNOSIS — E1065 Type 1 diabetes mellitus with hyperglycemia: Principal | ICD-10-CM | POA: Diagnosis present

## 2021-02-22 DIAGNOSIS — Z881 Allergy status to other antibiotic agents status: Secondary | ICD-10-CM

## 2021-02-22 DIAGNOSIS — F419 Anxiety disorder, unspecified: Secondary | ICD-10-CM | POA: Diagnosis present

## 2021-02-22 DIAGNOSIS — E1165 Type 2 diabetes mellitus with hyperglycemia: Principal | ICD-10-CM | POA: Insufficient documentation

## 2021-02-22 DIAGNOSIS — Z79899 Other long term (current) drug therapy: Secondary | ICD-10-CM | POA: Diagnosis not present

## 2021-02-22 DIAGNOSIS — R739 Hyperglycemia, unspecified: Secondary | ICD-10-CM | POA: Diagnosis present

## 2021-02-22 DIAGNOSIS — I10 Essential (primary) hypertension: Secondary | ICD-10-CM | POA: Diagnosis present

## 2021-02-22 DIAGNOSIS — R112 Nausea with vomiting, unspecified: Secondary | ICD-10-CM | POA: Diagnosis present

## 2021-02-22 DIAGNOSIS — R197 Diarrhea, unspecified: Secondary | ICD-10-CM | POA: Diagnosis not present

## 2021-02-22 DIAGNOSIS — F17210 Nicotine dependence, cigarettes, uncomplicated: Secondary | ICD-10-CM | POA: Insufficient documentation

## 2021-02-22 DIAGNOSIS — F17200 Nicotine dependence, unspecified, uncomplicated: Secondary | ICD-10-CM | POA: Diagnosis present

## 2021-02-22 DIAGNOSIS — K5090 Crohn's disease, unspecified, without complications: Secondary | ICD-10-CM | POA: Diagnosis present

## 2021-02-22 DIAGNOSIS — Z7984 Long term (current) use of oral hypoglycemic drugs: Secondary | ICD-10-CM

## 2021-02-22 DIAGNOSIS — K529 Noninfective gastroenteritis and colitis, unspecified: Secondary | ICD-10-CM

## 2021-02-22 LAB — COMPREHENSIVE METABOLIC PANEL
ALT: 19 U/L (ref 0–44)
AST: 19 U/L (ref 15–41)
Albumin: 4.2 g/dL (ref 3.5–5.0)
Alkaline Phosphatase: 61 U/L (ref 38–126)
Anion gap: 17 — ABNORMAL HIGH (ref 5–15)
BUN: 16 mg/dL (ref 6–20)
CO2: 21 mmol/L — ABNORMAL LOW (ref 22–32)
Calcium: 9.6 mg/dL (ref 8.9–10.3)
Chloride: 104 mmol/L (ref 98–111)
Creatinine, Ser: 0.6 mg/dL (ref 0.44–1.00)
GFR, Estimated: >60 mL/min (ref >60)
Glucose, Bld: 356 mg/dL — ABNORMAL HIGH (ref 70–99)
Potassium: 3.7 mmol/L (ref 3.5–5.1)
Sodium: 142 mmol/L (ref 135–145)
Total Bilirubin: 1.2 mg/dL (ref 0.3–1.2)
Total Protein: 7.9 g/dL (ref 6.5–8.1)

## 2021-02-22 LAB — CBC WITH DIFFERENTIAL/PLATELET
Abs Immature Granulocytes: 0.16 10*3/uL — ABNORMAL HIGH (ref 0.00–0.07)
Basophils Absolute: 0.1 10*3/uL (ref 0.0–0.1)
Basophils Relative: 0 %
Eosinophils Absolute: 0.1 10*3/uL (ref 0.0–0.5)
Eosinophils Relative: 0 %
HCT: 44.5 % (ref 36.0–46.0)
Hemoglobin: 15.5 g/dL — ABNORMAL HIGH (ref 12.0–15.0)
Immature Granulocytes: 1 %
Lymphocytes Relative: 8 %
Lymphs Abs: 1.6 10*3/uL (ref 0.7–4.0)
MCH: 30.2 pg (ref 26.0–34.0)
MCHC: 34.8 g/dL (ref 30.0–36.0)
MCV: 86.7 fL (ref 80.0–100.0)
Monocytes Absolute: 1.1 10*3/uL — ABNORMAL HIGH (ref 0.1–1.0)
Monocytes Relative: 5 %
Neutro Abs: 17.3 10*3/uL — ABNORMAL HIGH (ref 1.7–7.7)
Neutrophils Relative %: 86 %
Platelets: 303 10*3/uL (ref 150–400)
RBC: 5.13 MIL/uL — ABNORMAL HIGH (ref 3.87–5.11)
RDW: 12.5 % (ref 11.5–15.5)
WBC: 20.3 10*3/uL — ABNORMAL HIGH (ref 4.0–10.5)
nRBC: 0 % (ref 0.0–0.2)

## 2021-02-22 LAB — BLOOD GAS, VENOUS
Acid-base deficit: 2.9 mmol/L — ABNORMAL HIGH (ref 0.0–2.0)
Bicarbonate: 18.4 mmol/L — ABNORMAL LOW (ref 20.0–28.0)
O2 Saturation: 92.4 %
Patient temperature: 98.6
pCO2, Ven: 25.9 mmHg — ABNORMAL LOW (ref 44.0–60.0)
pH, Ven: 7.465 — ABNORMAL HIGH (ref 7.250–7.430)
pO2, Ven: 61 mmHg — ABNORMAL HIGH (ref 32.0–45.0)

## 2021-02-22 LAB — CBG MONITORING, ED
Glucose-Capillary: 342 mg/dL — ABNORMAL HIGH (ref 70–99)
Glucose-Capillary: 292 mg/dL — ABNORMAL HIGH (ref 70–99)
Glucose-Capillary: 219 mg/dL — ABNORMAL HIGH (ref 70–99)
Glucose-Capillary: 210 mg/dL — ABNORMAL HIGH (ref 70–99)
Glucose-Capillary: 185 mg/dL — ABNORMAL HIGH (ref 70–99)
Glucose-Capillary: 174 mg/dL — ABNORMAL HIGH (ref 70–99)
Glucose-Capillary: 140 mg/dL — ABNORMAL HIGH (ref 70–99)

## 2021-02-22 LAB — RENAL FUNCTION PANEL
Albumin: 4 g/dL (ref 3.5–5.0)
Anion gap: 13 (ref 5–15)
BUN: 13 mg/dL (ref 6–20)
CO2: 25 mmol/L (ref 22–32)
Calcium: 9.1 mg/dL (ref 8.9–10.3)
Chloride: 103 mmol/L (ref 98–111)
Creatinine, Ser: 0.5 mg/dL (ref 0.44–1.00)
GFR, Estimated: >60 mL/min (ref >60)
Glucose, Bld: 217 mg/dL — ABNORMAL HIGH (ref 70–99)
Phosphorus: 3.7 mg/dL (ref 2.5–4.6)
Potassium: 3.8 mmol/L (ref 3.5–5.1)
Sodium: 141 mmol/L (ref 135–145)

## 2021-02-22 LAB — RESP PANEL BY RT-PCR (FLU A&B, COVID) ARPGX2
Influenza A by PCR: NEGATIVE
Influenza B by PCR: NEGATIVE
SARS Coronavirus 2 by RT PCR: NEGATIVE

## 2021-02-22 LAB — LACTIC ACID, PLASMA: Lactic Acid, Venous: 1.1 mmol/L (ref 0.5–1.9)

## 2021-02-22 LAB — HEMOGLOBIN AND HEMATOCRIT, BLOOD
HCT: 38.7 % (ref 36.0–46.0)
Hemoglobin: 13.6 g/dL (ref 12.0–15.0)

## 2021-02-22 LAB — BETA-HYDROXYBUTYRIC ACID
Beta-Hydroxybutyric Acid: 2.36 mmol/L — ABNORMAL HIGH (ref 0.05–0.27)
Beta-Hydroxybutyric Acid: 0.19 mmol/L (ref 0.05–0.27)
Beta-Hydroxybutyric Acid: 0.28 mmol/L — ABNORMAL HIGH (ref 0.05–0.27)

## 2021-02-22 LAB — HIV ANTIBODY (ROUTINE TESTING W REFLEX): HIV Screen 4th Generation wRfx: NONREACTIVE

## 2021-02-22 LAB — HCG, QUANTITATIVE, PREGNANCY: hCG, Beta Chain, Quant, S: 2 m[IU]/mL (ref <5)

## 2021-02-22 MED ORDER — INSULIN REGULAR(HUMAN) IN NACL 100-0.9 UT/100ML-% IV SOLN
INTRAVENOUS | Status: DC
  Administered 2021-02-22: 9.5 [IU]/h via INTRAVENOUS
  Filled 2021-02-22: qty 100

## 2021-02-22 MED ORDER — PROCHLORPERAZINE EDISYLATE 10 MG/2ML IJ SOLN
10.0000 mg | Freq: Four times a day (QID) | INTRAMUSCULAR | Status: DC | PRN
  Administered 2021-02-22: 10 mg via INTRAVENOUS
  Filled 2021-02-22: qty 2

## 2021-02-22 MED ORDER — LACTATED RINGERS IV SOLN: INTRAVENOUS | Status: DC

## 2021-02-22 MED ORDER — INSULIN ASPART 100 UNIT/ML IJ SOLN
0.0000 [IU] | Freq: Every day | INTRAMUSCULAR | Status: DC
  Filled 2021-02-22: qty 0.05

## 2021-02-22 MED ORDER — DEXTROSE IN LACTATED RINGERS 5 % IV SOLN: INTRAVENOUS | Status: DC

## 2021-02-22 MED ORDER — LACTATED RINGERS IV BOLUS
1800.0000 mL | Freq: Once | INTRAVENOUS | Status: AC
  Administered 2021-02-22: 1800 mL via INTRAVENOUS

## 2021-02-22 MED ORDER — PANTOPRAZOLE SODIUM 40 MG IV SOLR
40.0000 mg | INTRAVENOUS | Status: DC
  Administered 2021-02-22: 40 mg via INTRAVENOUS
  Filled 2021-02-22: qty 40

## 2021-02-22 MED ORDER — INSULIN GLARGINE-YFGN 100 UNIT/ML ~~LOC~~ SOLN
8.0000 [IU] | Freq: Every day | SUBCUTANEOUS | Status: DC
  Administered 2021-02-22: 8 [IU] via SUBCUTANEOUS
  Filled 2021-02-22: qty 0.08

## 2021-02-22 MED ORDER — METOCLOPRAMIDE HCL 5 MG/ML IJ SOLN
10.0000 mg | Freq: Once | INTRAMUSCULAR | Status: AC
  Administered 2021-02-22: 10 mg via INTRAVENOUS
  Filled 2021-02-22: qty 2

## 2021-02-22 MED ORDER — DEXTROSE 50 % IV SOLN: 0.0000 mL | INTRAVENOUS | Status: DC | PRN

## 2021-02-22 MED ORDER — ONDANSETRON HCL 4 MG/2ML IJ SOLN
4.0000 mg | Freq: Once | INTRAMUSCULAR | Status: AC | PRN
  Administered 2021-02-22: 4 mg via INTRAVENOUS
  Filled 2021-02-22: qty 2

## 2021-02-22 MED ORDER — INSULIN ASPART 100 UNIT/ML IJ SOLN
0.0000 [IU] | Freq: Three times a day (TID) | INTRAMUSCULAR | Status: DC
  Administered 2021-02-22: 3 [IU] via SUBCUTANEOUS
  Filled 2021-02-22: qty 0.15

## 2021-02-22 NOTE — ED Triage Notes (Signed)
Patient arrives from home BIB EMS with complaints of N/V/D. Patient is a diabetic. Patient currently heaving. Patient was off her insulin and recently got back on her medications. CBG 360.  Zofran and NS given en route  20 LH

## 2021-02-22 NOTE — ED Provider Notes (Signed)
[place] DEPT Provider Note: Georgena Spurling, MD, FACEP  CSN: [id number] MRN: [id number] ARRIVAL: 02/22/21 at [place]: [place]  Vomiting and Hyperglycemia   HISTORY OF PRESENT ILLNESS  02/22/21 4:21 AM [name] is a 45 y.o. female with type 1 diabetes.  She is here with nausea, vomiting and diarrhea which began yesterday evening about 9 PM.  She does not believe this is diabetic ketoacidosis as she has been compliant with her insulin for about the last 3 weeks.  She describes her symptoms as moderate to severe.  She is having some abdominal cramping with this which she rates as a 6 out of 10.  She was noted to be retching in triage.  She was given Zofran in triage without relief of her nausea.   Past Medical History:  Diagnosis Date   Anxiety    Crohn's disease ([place])    Diabetes mellitus    Hypertension    Insomnia    Neuropathy     Past Surgical History:  Procedure Laterality Date   RECTAL SURGERY      Family History  Problem Relation Age of Onset   Heart disease Father     Social History   Tobacco Use   Smoking status: Every Day   Smokeless tobacco: Never  Substance Use Topics   Alcohol use: No   Drug use: No    Prior to Admission medications   Medication Sig Start Date End Date Taking? Authorizing Provider  ACCU-CHEK GUIDE test strip 1 each by Other route in the morning, at noon, in the evening, and at bedtime. Use as instructed 01/26/21   Shamleffer, Melanie Crazier, MD  atorvastatin (LIPITOR) 40 MG tablet  10/02/20   [provider]  Blood Glucose Monitoring Suppl (ACCU-CHEK GUIDE ME) w/Device KIT 1 Device by Does not apply route in the morning, at noon, in the evening, and at bedtime. 01/26/21   Shamleffer, Melanie Crazier, MD  Continuous Blood Gluc Sensor (DEXCOM G6 SENSOR) MISC 1 Device by Does not apply route as directed. Patient not taking: Reported on 01/26/2021 10/20/20   Shamleffer, Melanie Crazier, MD   Continuous Blood Gluc Transmit (DEXCOM G6 TRANSMITTER) MISC 1 Device by Does not apply route as directed. Patient not taking: Reported on 01/26/2021 10/20/20   Shamleffer, Melanie Crazier, MD  dapagliflozin propanediol (FARXIGA) 10 MG TABS tablet Take 1 tablet (10 mg total) by mouth daily before breakfast. 01/26/21   Shamleffer, Melanie Crazier, MD  escitalopram (LEXAPRO) 20 MG tablet Take 1 tablet by mouth daily. 09/11/20   [provider]  gabapentin (NEURONTIN) 300 MG capsule gabapentin 300 mg capsule  TAKE 3 CAPSULES BY MOUTH EVERY 6 HOURS    [provider]  insulin degludec (TRESIBA FLEXTOUCH) 100 UNIT/ML FlexTouch Pen Inject 20 Units into the skin daily. Patient not taking: Reported on 01/26/2021 10/20/20   Shamleffer, Melanie Crazier, MD  insulin lispro (HUMALOG KWIKPEN) 100 UNIT/ML KwikPen Max daily 50 units 10/20/20   Shamleffer, Melanie Crazier, MD  Insulin Pen Needle 32G X 4 MM MISC 1 Device by Does not apply route in the morning, at noon, in the evening, and at bedtime. 10/20/20   Shamleffer, Melanie Crazier, MD  lisinopril (ZESTRIL) 10 MG tablet Take 10 mg by mouth daily. 10/02/20   [provider]  metFORMIN (GLUCOPHAGE) 500 MG tablet metformin 500 mg tablet  TAKE 1 TABLET BY MOUTH TWICE DAILY    [provider]  zolpidem (AMBIEN) 10 MG tablet Take  10 mg by mouth at bedtime as needed. 10/02/20   [provider]    Allergies Ceclor [cefaclor]   REVIEW OF SYSTEMS  Negative except as noted here or in the History of Present Illness.   PHYSICAL EXAMINATION  Initial Vital Signs Blood pressure (!) 138/97, pulse 65, resp. rate 20, SpO2 98 %, unknown if currently breastfeeding.  Examination General: Well-developed, well-nourished female in no acute distress; appearance consistent with age of record HENT: normocephalic; atraumatic Eyes: Normal appearance Neck: supple Heart: regular rate and rhythm Lungs: clear to auscultation  bilaterally Abdomen: soft; nondistended; nontender; bowel sounds present Extremities: No deformity; full range of motion; pulses normal Neurologic: Awake, alert and oriented; motor function intact in all extremities and symmetric; no facial droop Skin: Warm and dry Psychiatric: Flat affect   RESULTS  Summary of this visit's results, reviewed and interpreted by myself:   EKG Interpretation  Date/Time:    Ventricular Rate:    PR Interval:    QRS Duration:   QT Interval:    QTC Calculation:   R Axis:     Text Interpretation:         Laboratory Studies: Results for orders placed or performed during the hospital encounter of 02/22/21 (from the past 24 hour(s))  CBG monitoring, ED     Status: Abnormal   Collection Time: 02/22/21  4:01 AM  Result Value Ref Range   Glucose-Capillary 342 (H) 70 - 99 mg/dL  CBC with Differential     Status: Abnormal   Collection Time: 02/22/21  4:04 AM  Result Value Ref Range   WBC 20.3 (H) 4.0 - 10.5 K/uL   RBC 5.13 (H) 3.87 - 5.11 MIL/uL   Hemoglobin 15.5 (H) 12.0 - 15.0 g/dL   HCT 44.5 36.0 - 46.0 %   MCV 86.7 80.0 - 100.0 fL   MCH 30.2 26.0 - 34.0 pg   MCHC 34.8 30.0 - 36.0 g/dL   RDW 12.5 11.5 - 15.5 %   Platelets 303 150 - 400 K/uL   nRBC 0.0 0.0 - 0.2 %   Neutrophils Relative % 86 %   Neutro Abs 17.3 (H) 1.7 - 7.7 K/uL   Lymphocytes Relative 8 %   Lymphs Abs 1.6 0.7 - 4.0 K/uL   Monocytes Relative 5 %   Monocytes Absolute 1.1 (H) 0.1 - 1.0 K/uL   Eosinophils Relative 0 %   Eosinophils Absolute 0.1 0.0 - 0.5 K/uL   Basophils Relative 0 %   Basophils Absolute 0.1 0.0 - 0.1 K/uL   Immature Granulocytes 1 %   Abs Immature Granulocytes 0.16 (H) 0.00 - 0.07 K/uL  Comprehensive metabolic panel     Status: Abnormal   Collection Time: 02/22/21  4:04 AM  Result Value Ref Range   Sodium 142 135 - 145 mmol/L   Potassium 3.7 3.5 - 5.1 mmol/L   Chloride 104 98 - 111 mmol/L   CO2 21 (L) 22 - 32 mmol/L   Glucose, Bld 356 (H) 70 - 99 mg/dL    BUN 16 6 - 20 mg/dL   Creatinine, Ser 0.60 0.44 - 1.00 mg/dL   Calcium 9.6 8.9 - 10.3 mg/dL   Total Protein 7.9 6.5 - 8.1 g/dL   Albumin 4.2 3.5 - 5.0 g/dL   AST 19 15 - 41 U/L   ALT 19 0 - 44 U/L   Alkaline Phosphatase 61 38 - 126 U/L   Total Bilirubin 1.2 0.3 - 1.2 mg/dL   GFR, Estimated >60 >60 mL/min     Anion gap 17 (H) 5 - 15  Beta-hydroxybutyric acid     Status: Abnormal   Collection Time: 02/22/21  4:21 AM  Result Value Ref Range   Beta-Hydroxybutyric Acid 2.36 (H) 0.05 - 0.27 mmol/L  hCG, quantitative, pregnancy     Status: None   Collection Time: 02/22/21  4:23 AM  Result Value Ref Range   hCG, Beta Chain, Quant, S 2 <5 mIU/mL  Blood gas, venous (at [facility] and [facility], not at [facility])     Status: Abnormal   Collection Time: 02/22/21  4:28 AM  Result Value Ref Range   pH, Ven 7.465 (H) 7.250 - 7.430   pCO2, Ven 25.9 (L) 44.0 - 60.0 mmHg   pO2, Ven 61.0 (H) 32.0 - 45.0 mmHg   Bicarbonate 18.4 (L) 20.0 - 28.0 mmol/L   Acid-base deficit 2.9 (H) 0.0 - 2.0 mmol/L   O2 Saturation 92.4 %   Patient temperature 98.6   CBG monitoring, ED     Status: Abnormal   Collection Time: 02/22/21  5:36 AM  Result Value Ref Range   Glucose-Capillary 292 (H) 70 - 99 mg/dL  CBG monitoring, ED     Status: Abnormal   Collection Time: 02/22/21  6:32 AM  Result Value Ref Range   Glucose-Capillary 219 (H) 70 - 99 mg/dL   Imaging Studies: No results found.  ED COURSE and MDM  Nursing notes, initial and subsequent vitals signs, including pulse oximetry, reviewed and interpreted by myself.  Vitals:   02/22/21 0530 02/22/21 0540 02/22/21 0600 02/22/21 0630  BP: 122/80  (!) 134/96 121/88  Pulse: 66  67 73  Resp: 17  14 15  Temp:  97.7 F (36.5 C)    TempSrc:  Oral    SpO2: 100%  96% 95%   Medications  insulin regular, human (MYXREDLIN) 100 units/ 100 mL infusion (3.8 Units/hr Intravenous Rate/Dose Change 02/22/21 0641)  lactated ringers infusion ( Intravenous New Bag/Given 02/22/21 0436)  dextrose  5 % in lactated ringers infusion ( Intravenous New Bag/Given 02/22/21 0640)  dextrose 50 % solution 0-50 mL (has no administration in time range)  metoCLOPramide (REGLAN) injection 10 mg (has no administration in time range)  ondansetron (ZOFRAN) injection 4 mg (4 mg Intravenous Given 02/22/21 0413)  lactated ringers bolus 1,800 mL (0 mLs Intravenous Stopped 02/22/21 0641)   4:21 AM DKA protocol initiated.   6:32 AM Laboratory values not consistent with diabetic ketoacidosis.  I suspect this represents gastroenteritis.  She states her blood sugars are usually poorly controlled, and it is not unusual for her her sugar to be in the 500s.  7:30 AM Dr. Kyle to admit.   PROCEDURES  Procedures   ED DIAGNOSES     ICD-10-CM   1. Gastroenteritis  K52.9     2. Hyperglycemia  R73.9          , , MD 02/22/21 1055  

## 2021-02-22 NOTE — ED Notes (Signed)
I attempted x2 blood draw without success.

## 2021-02-22 NOTE — ED Notes (Signed)
Attempted to update pt and daughter about plan; both sleeping at this time

## 2021-02-22 NOTE — ED Notes (Signed)
Patient requesting nausea medication. Dr. Read Drivers notified.

## 2021-02-22 NOTE — ED Notes (Signed)
Lab to add on beta hydroxy

## 2021-02-22 NOTE — ED Notes (Signed)
Pt updated that [facility] is working on her bed placement and thinks that they should get her a bed soon. Pt states that she still may choose to leave AMA due to being tired of waiting. I messaged the admitting physician to update them.

## 2021-02-22 NOTE — ED Notes (Signed)
Patients family upset and yelling at RN on the phone that their mother needs to have a bowel movement, that she has said it so many times and no one has done anything about it. RN stated that patient can use a bedpan until we find a bedside commode and that the patient just arrived back into a room. RN stated that she needed to start the insulin drip to help improve her condition. Patients family members still yelling through the phone that they all want to come back. RN stated one visitor allowed at a time.

## 2021-02-22 NOTE — H&P (Addendum)
History and Physical    [name] CNO:[id number] DOB: 11-07-1975 DOA: 02/22/2021  PCP: [facility]  Patient coming from: Home  Chief Complaint: N/V  HPI: [name] is a 45 y.o. female with medical history significant of DM, HTN, anxiety. Presenting with nausea and vomiting. She reports that she was in her normal state of health til around 10p last night. She began having nausea and started vomiting. She has been unable to stop since then. There has been retching and some blood noted in her emesis. She has had diarrhea and lower abdominal cramping. When her symptoms worsened to include lightheadedness, she decided to come to the ED.   ED Course: Her gap was elevated. Bicarb was slight down and her glucose was elevated. Her WBC were elevated. She was started on endotool. TRH was called for admisssion.   Review of Systems:  Review of systems is otherwise negative for all not mentioned in HPI.   PMHx Past Medical History:  Diagnosis Date   Anxiety    Crohn's disease ([place])    Diabetes mellitus    Hypertension    Insomnia    Neuropathy     PSHx Past Surgical History:  Procedure Laterality Date   RECTAL SURGERY      SocHx  reports that she has been smoking. She has never used smokeless tobacco. She reports that she does not drink alcohol and does not use drugs.  Allergies  Allergen Reactions   Ceclor [Cefaclor] Hives    FamHx Family History  Problem Relation Age of Onset   Heart disease Father     Prior to Admission medications   Medication Sig Start Date End Date Taking? Authorizing Provider  atorvastatin (LIPITOR) 40 MG tablet  10/02/20  Yes [provider]  dapagliflozin propanediol (FARXIGA) 10 MG TABS tablet Take 1 tablet (10 mg total) by mouth daily before breakfast. 01/26/21  Yes Shamleffer, Melanie Crazier, MD  escitalopram (LEXAPRO) 20 MG tablet Take 20 mg by mouth daily. 09/11/20  Yes [provider]  gabapentin  (NEURONTIN) 300 MG capsule Take 600-900 mg by mouth 4 (four) times daily.   Yes [provider]  insulin lispro (HUMALOG KWIKPEN) 100 UNIT/ML KwikPen Max daily 50 units Patient taking differently: Inject 15 Units into the skin 3 (three) times daily before meals. Max daily 50 units 10/20/20  Yes Shamleffer, Melanie Crazier, MD  LANTUS SOLOSTAR 100 UNIT/ML Solostar Pen Inject 15-20 Units into the skin at bedtime. 02/05/21  Yes [provider]  lisinopril (ZESTRIL) 10 MG tablet Take 10 mg by mouth daily. 10/02/20  Yes [provider]  metFORMIN (GLUCOPHAGE) 500 MG tablet Take 500 mg by mouth 2 (two) times daily with a meal.   Yes [provider]  zolpidem (AMBIEN) 10 MG tablet Take 10 mg by mouth at bedtime as needed for sleep. 10/02/20  Yes [provider]  ACCU-CHEK GUIDE test strip 1 each by Other route in the morning, at noon, in the evening, and at bedtime. Use as instructed 01/26/21   Shamleffer, Melanie Crazier, MD  Blood Glucose Monitoring Suppl (ACCU-CHEK GUIDE ME) w/Device KIT 1 Device by Does not apply route in the morning, at noon, in the evening, and at bedtime. 01/26/21   Shamleffer, Melanie Crazier, MD  Continuous Blood Gluc Sensor (DEXCOM G6 SENSOR) MISC 1 Device by Does not apply route as directed. Patient not taking: No sig reported 10/20/20   Shamleffer, Melanie Crazier, MD  Continuous Blood Gluc Transmit (  DEXCOM G6 TRANSMITTER) MISC 1 Device by Does not apply route as directed. Patient not taking: No sig reported 10/20/20   Shamleffer, Melanie Crazier, MD  insulin degludec (TRESIBA FLEXTOUCH) 100 UNIT/ML FlexTouch Pen Inject 20 Units into the skin daily. Patient not taking: No sig reported 10/20/20   Shamleffer, Melanie Crazier, MD  Insulin Pen Needle 32G X 4 MM MISC 1 Device by Does not apply route in the morning, at noon, in the evening, and at bedtime. 10/20/20   Shamleffer, Melanie Crazier, MD    Physical Exam: Vitals:   02/22/21 0540  02/22/21 0600 02/22/21 0630 02/22/21 0700  BP:  (!) 134/96 121/88 117/81  Pulse:  67 73 71  Resp:  _0 Temp: 97.7 F (36.5 C)     TempSrc: Oral     SpO2:  96% 95% 96%    General: 45 y.o. female resting in bed in NAD Eyes: PERRL, normal sclera ENMT: Nares patent w/o discharge, orophaynx clear, dentition normal, ears w/o discharge/lesions/ulcers Neck: Supple, trachea midline Cardiovascular: RRR, +S1, S2, no m/g/r, equal pulses throughout Respiratory: CTABL, no w/r/r, normal WOB GI: BS+, NDNT, no masses noted, no organomegaly noted MSK: No e/c/c Skin: No rashes, bruises, ulcerations noted Neuro: A&O x 3, no focal deficits Psyc: Appropriate interaction and affect, calm/cooperative  Labs on Admission: I have personally reviewed following labs and imaging studies  CBC: Recent Labs  Lab 02/22/21 0404  WBC 20.3*  NEUTROABS 17.3*  HGB 15.5*  HCT 44.5  MCV 86.7  PLT 638   Basic Metabolic Panel: Recent Labs  Lab 02/22/21 0404  NA 142  K 3.7  CL 104  CO2 21*  GLUCOSE 356*  BUN 16  CREATININE 0.60  CALCIUM 9.6   GFR: CrCl cannot be calculated (Unknown ideal weight.). Liver Function Tests: Recent Labs  Lab 02/22/21 0404  AST 19  ALT 19  ALKPHOS 61  BILITOT 1.2  PROT 7.9  ALBUMIN 4.2   No results for input(s): LIPASE, AMYLASE in the last 168 hours. No results for input(s): AMMONIA in the last 168 hours. Coagulation Profile: No results for input(s): INR, PROTIME in the last 168 hours. Cardiac Enzymes: No results for input(s): CKTOTAL, CKMB, CKMBINDEX, TROPONINI in the last 168 hours. BNP (last 3 results) No results for input(s): PROBNP in the last 8760 hours. HbA1C: No results for input(s): HGBA1C in the last 72 hours. CBG: Recent Labs  Lab 02/22/21 0401 02/22/21 0536 02/22/21 0632  GLUCAP 342* 292* 219*   Lipid Profile: No results for input(s): CHOL, HDL, LDLCALC, TRIG, CHOLHDL, LDLDIRECT in the last 72 hours. Thyroid Function Tests: No results  for input(s): TSH, T4TOTAL, FREET4, T3FREE, THYROIDAB in the last 72 hours. Anemia Panel: No results for input(s): VITAMINB12, FOLATE, FERRITIN, TIBC, IRON, RETICCTPCT in the last 72 hours. Urine analysis:    Component Value Date/Time   COLORURINE YELLOW 11/16/2016 0818   APPEARANCEUR HAZY (A) 11/16/2016 0818   LABSPEC 1.010 11/16/2016 0818   PHURINE 5.5 11/16/2016 0818   GLUCOSEU >=500 (A) 11/16/2016 0818   HGBUR LARGE (A) 11/16/2016 0818   BILIRUBINUR NEGATIVE 11/16/2016 0818   KETONESUR 15 (A) 11/16/2016 0818   PROTEINUR 100 (A) 11/16/2016 0818   UROBILINOGEN 0.2 08/04/2010 0321   NITRITE POSITIVE (A) 11/16/2016 0818   LEUKOCYTESUR NEGATIVE 11/16/2016 0818    Radiological Exams on Admission: No results found.  EKG: None obtained in ED  Assessment/Plan Hyperglycemia/HHS DM2     - admit to inpt, SDU     -  continue insulin gtt through endotool     - can have non-caloric fluids as tolerated     - check A1c  Intractable N/V Hematemesis     - denies any chance of food poisoning     - she's had retching; bleeding noted after retching at home, none noted in ED     - reports Hx of PUD     - let's get her anti-emetics, protonix     - Hgb is stable; check another H&H this afternoon     - checking CT ab/pelvis; shows small fat containing ab hernia, not source on N/V (spoke with gen surg)  Abdominal  Hx of Crohns Diarrhea     - non-tender exam     - she's had diarrhea, but no hematochezia     - check c diff, GI PCR     - CT ab/pelvis ordered  Leukocytosis     - could be reactive     - no fevers     - checking stool studies, follow  HLD     - resume home statin when taking PO  Anxiety      - resume home regimen when taking PO   DVT prophylaxis: SCDs  Code Status: FULL  Family Communication: w/ family at bedside  Consults called: None   Status is: Inpatient  Remains inpatient appropriate because:Inpatient level of care appropriate due to severity of  illness  Dispo: The patient is from: Home              Anticipated d/c is to: Home              Patient currently is not medically stable to d/c.   Difficult to place patient No  Time spent coordinating admission: 45 minutes  [facility]  If 7PM-7AM, please contact night-coverage [email or web address]  02/22/2021, 7:49 AM

## 2021-02-23 NOTE — Discharge Summary (Signed)
AMA Discharge   Notified by [facility] that patient wanted to leave AMA d/t long ED hold time. She was notified that she should get her bed soon and it was recommended that she complete the care plan discussed w/ me. She decided to leave anyway. It's recommended that she follow up with PCP ASAP.  Discharge Dx: Hyperglycemia/HHS DM2 Intractable N/V Hematemesis Abdominal pain Hx of Crohns Diarrhea Leukocytosis HLD Anxiety

## 2021-02-26 ENCOUNTER — Ambulatory Visit (INDEPENDENT_AMBULATORY_CARE_PROVIDER_SITE_OTHER): Payer: Medicaid Other | Admitting: Gastroenterology

## 2021-02-26 ENCOUNTER — Encounter: Payer: Self-pay | Admitting: Gastroenterology

## 2021-02-26 VITALS — BP 104/68 | HR 94 | Ht 63.0 in | Wt 207.0 lb

## 2021-02-26 DIAGNOSIS — R197 Diarrhea, unspecified: Secondary | ICD-10-CM | POA: Diagnosis not present

## 2021-02-26 DIAGNOSIS — Z8719 Personal history of other diseases of the digestive system: Secondary | ICD-10-CM

## 2021-02-26 NOTE — Patient Instructions (Signed)
Your provider has requested that you go to the basement level for lab work before leaving today. Press "B" on the elevator. The lab is located at the first door on the left as you exit the elevator.  We will refer you to [facility] IBD clinic. They will contact you with an appointment date and time.   We will obtain your previous GI records for review.   Continue long term care for Crohns disease at [facility].   Due to recent changes in healthcare laws, you may see the results of your imaging and laboratory studies on MyChart before your provider has had a chance to review them.  We understand that in some cases there may be results that are confusing or concerning to you. Not all laboratory results come back in the same time frame and the provider may be waiting for multiple results in order to interpret others.  Please give [place] 48 hours in order for your provider to thoroughly review all the results before contacting the office for clarification of your results.   The [facility] providers would like to encourage you to use [facility] to communicate with providers for non-urgent requests or questions.  Due to long hold times on the telephone, sending your provider a message by [facility] may be a faster and more efficient way to get a response.  Please allow 48 business hours for a response.  Please remember that this is for non-urgent requests.   Thank you for choosing me and [facility].  Venita Lick. Pleas Koch., MD., Clementeen Graham

## 2021-02-26 NOTE — Progress Notes (Signed)
History of Present Illness: This is a 45 year old female referred by [facility], [facility]* for the evaluation of ongoing diarrhea, intermittent nausea and vomiting and a history of Crohn's disease. She is unaccompanied.  Unfortunately prior records from her Crohn's disease diagnosis and treatment over the years are not available today except for one encounter in 2012 in [place].  She relates she was diagnosed with Crohn's disease in her early 40s and that she has had perirectal fistula and perirectal abscesses.  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  She has been treated with prednisone in the past.  Her last gastroenterologist was in [place], [place].  She states she had a colonoscopy performed about 4 to 5 years ago in [place], [place]. Epic records show a short hospitalization in February 2012 for a perirectal abscess with concerns for rectovaginal fistula with a history of Crohn's disease.  She was treated by Dr. Alphonsa Overall with transanal drainage of a perirectal abscess.  Outpatient GI follow-up with her gastroenterologist, Dr. Carol Ada, was recommended.  She relates recurrent perirectal abscesses over the years with surgical management in [place] and in [place].   For the past 3 to 4 years she has had ongoing problems with diarrhea with 8-10 loose watery nonbloody bowel movements per day. She has intermittent episodes of nausea and vomiting.  Recently she developed nausea and vomiting and presented to [facility] ED.  She has DM that has not been well controlled.  She has had presented to [facility] ED on 9/19 for evaluation and she was admitted however she left the AMA. WBC=20.3, gluc=356 which improved to 217. GI stool studies were not completed. Denies weight loss, abdominal  pain, constipation, change in stool caliber, melena, hematochezia, dysphagia, reflux symptoms, chest pain.    02/22/2021 CT AP MPRESSION: 1. Small fat containing ventral abdominal hernia just superior and to the left of the umbilicus. There is mild stranding in the herniated fat raising the possibility of strangulation. 2. Otherwise, no acute findings in the abdomen or pelvis.   Allergies  Allergen Reactions   Ceclor [Cefaclor] Hives   Outpatient Medications Prior to Visit  Medication Sig Dispense Refill   ACCU-CHEK GUIDE test strip 1 each by Other route in the morning, at noon, in the evening, and at bedtime. Use as instructed 400 each 3   atorvastatin (LIPITOR) 40 MG tablet      Blood Glucose Monitoring Suppl (ACCU-CHEK GUIDE ME) w/Device KIT 1 Device by Does not apply route in the morning, at noon, in the evening, and at bedtime. 1 kit 0   Continuous Blood Gluc Sensor (DEXCOM G6 SENSOR) MISC 1 Device by Does not apply route as directed. 9 each 3   Continuous Blood Gluc Transmit (DEXCOM G6 TRANSMITTER) MISC 1 Device by Does not apply route as directed. 1 each 3   dapagliflozin propanediol (FARXIGA) 10 MG TABS tablet Take 1 tablet (10 mg total) by mouth daily before breakfast. 90 tablet 1   escitalopram (LEXAPRO) 20 MG tablet Take 20 mg by mouth daily.     gabapentin (NEURONTIN) 300 MG capsule Take 600-900 mg by mouth 4 (four) times daily.     insulin degludec (TRESIBA FLEXTOUCH) 100 UNIT/ML  FlexTouch Pen Inject 20 Units into the skin daily. 15 mL 11   insulin lispro (HUMALOG KWIKPEN) 100 UNIT/ML KwikPen Max daily 50 units (Patient taking differently: Inject 15 Units into the skin 3 (three) times daily before meals. Max daily 50 units) 30 mL 6   Insulin Pen Needle 32G X 4 MM MISC 1 Device by Does not apply route in the morning, at noon, in the evening, and at bedtime. 400 each 3   LANTUS SOLOSTAR 100 UNIT/ML Solostar Pen Inject 15-20 Units into the skin at bedtime.     lisinopril (ZESTRIL)  10 MG tablet Take 10 mg by mouth daily.     metFORMIN (GLUCOPHAGE) 500 MG tablet Take 500 mg by mouth 2 (two) times daily with a meal.     zolpidem (AMBIEN) 10 MG tablet Take 10 mg by mouth at bedtime as needed for sleep.     promethazine (PHENERGAN) 25 MG tablet promethazine 25 mg tablet  Take 1 tablet every 4 hours by oral route as needed.     No facility-administered medications prior to visit.   Past Medical History:  Diagnosis Date   Anxiety    Crohn's disease ([place])    Diabetes mellitus    Hypertension    Insomnia    Neuropathy    Past Surgical History:  Procedure Laterality Date   RECTAL SURGERY     Social History   Socioeconomic History   Marital status: Divorced    Spouse name: Not on file   Number of children: 6   Years of education: Not on file   Highest education level: Not on file  Occupational History   Occupation: Disable  Tobacco Use   Smoking status: Every Day   Smokeless tobacco: Never  Vaping Use   Vaping Use: Some days  Substance and Sexual Activity   Alcohol use: No   Drug use: No   Sexual activity: Not on file  Other Topics Concern   Not on file  Social History Narrative   ** Merged History Encounter **       Social Determinants of Health   Financial Resource Strain: Not on file  Food Insecurity: Not on file  Transportation Needs: Not on file  Physical Activity: Not on file  Stress: Not on file  Social Connections: Not on file   Family History  Problem Relation Age of Onset   Heart disease Father    Crohn's disease Father    Colon cancer Neg Hx    Rectal cancer Neg Hx    Esophageal cancer Neg Hx       Review of Systems: Pertinent positive and negative review of systems were noted in the above HPI section. All other review of systems were otherwise negative.   Physical Exam: General: Well developed, well nourished, no acute distress Head: Normocephalic and atraumatic Eyes: Sclerae anicteric, EOMI Ears: Normal auditory  acuity Mouth: Not examined, mask on during Covid-19 pandemic Neck: Supple, no masses or thyromegaly Lungs: Clear throughout to auscultation Heart: Regular rate and rhythm; no murmurs, rubs or bruits Abdomen: Soft, non tender and non distended. No masses, hepatosplenomegaly or hernias noted. Normal Bowel sounds Musculoskeletal: Symmetrical with no gross deformities  Skin: No lesions on visible extremities Pulses:  Normal pulses noted Extremities: No clubbing, cyanosis, edema or deformities noted Neurological: Alert oriented x 4, grossly nonfocal Cervical Nodes:  No significant cervical adenopathy Inguinal Nodes: No significant inguinal adenopathy Psychological:  Alert and cooperative. Normal mood and affect   Assessment and  Recommendations:  Crohn's disease with history of recurrent rectal abscesses and perirectal fistulae. She has ongoing diarrhea.  She relates positive TB testing and unable to complete course of treatment.  Given her complex Crohn's and apparent inability to safely use biologics I recommended she establish IBD care at a tertiary center and she agrees with this recommendation.  Referral to [facility] IBD clinic to establish care.  Request records from her gastroenterologist and surgeon in [place], [place]. Obtain GI stool profile, fecal calprotectin, ESR, CRP, HBsAg, Quantiferon gold, TSH today. Imodium 1-2 po tid prn diarrhea for now.  Intermittent nausea and vomiting.  Likely related to suboptimal control of diabetes.  See #3.  Phenergan 12.5 to 25 mg po every 4 hours as needed nausea, vomiting. Consider EGD for further evaluation if symptoms persist despite improved DM control.  Small ventral hernia containing fat.  DM, suboptimal control.  Hemoglobin A1c in May 2022 was 12.8.  Close follow-up with PCP.   cc: [facility] [address] [place],  [place] [address]

## 2021-02-26 NOTE — Addendum Note (Signed)
Addended by: Jillene Bucks L on: 02/26/2021 04:25 PM   Modules accepted: Orders

## 2021-02-27 LAB — CULTURE, BLOOD (ROUTINE X 2)
Culture: NO GROWTH
Culture: NO GROWTH
Special Requests: ADEQUATE
Special Requests: ADEQUATE

## 2021-03-01 NOTE — Progress Notes (Deleted)
Name: [name]  Age/ Sex: 45 y.o., female   MRN/ DOB: [id number], [birth year]     PCP: [facility]   Reason for Endocrinology Evaluation: Type 2 Diabetes Mellitus  Initial Endocrine Consultative Visit: 10/20/2020    PATIENT IDENTIFIER: [name] is a 45 y.o. female with a past medical history of T2DM, Dyslipidemia, Crohn's disease and HTN. The patient has followed with Endocrinology clinic since 10/20/2020 for consultative assistance with management of her diabetes.  DIABETIC HISTORY:  [name] was diagnosed with DM as gestational diabetes at age 54, by age 87 she was started on Metformin . Insulin was started in her 30's. Her hemoglobin A1c has ranged from 7.9% in 2012, peaking at 12.8% in 2022  ON her initial visit to our clinic  she had an A1c of 12.8 % . She had Metformin, lantus and Humalog on her list but had not taken insulin in 2 months by then. We continued Metformin, started [place] and restarted insulin .  She has hx of pyelonephritis as well as candidiasis  She is on disability for Crohn's  disease , unable to tolerate biological due to previous TB exposure    She has stable diarrhea due to Crohn's disease   SUBJECTIVE:   During the last visit (01/26/2021): A1c 12.0% We continued metformin, started [place] and restarted insulin      Today (03/01/2021): [name] is here for a follow up on diabetes management.  She checks her blood sugars occasionally. The patient has not had hypoglycemic episodes since the last clinic visit. She did not bring the meter today     She presented to the ED 02/22/2021 with nausea, vomiting and diarrhea. She had an elevated Ag at 17 and low CO2 at 21, with slight elevated in BHB 0.28 (reference 0.05-0.27) but left AMA   She has not received her dexcom   HOME DIABETES REGIMEN:  Metformin 500 mg , 1 tablet Twice daily  Farxiga 10 mg, 1 tablet in the morning  Tresiba 20 units once daily - taking 50  units  Humalog 6 units with each meal - taking 20 units  CF : Humalog ( BG- 130/40)      Statin: yes ACE-I/ARB: yes Prior Diabetic Education: yes   METER DOWNLOAD SUMMARY: Did not bring     DIABETIC COMPLICATIONS: Microvascular complications:  neuropathy Denies: CKD, retinopathy Last Eye Exam: Completed 10/2020  Macrovascular complications:   Denies: CAD, CVA, PVD   HISTORY:  Past Medical History:  Past Medical History:  Diagnosis Date   Anxiety    Crohn's disease ([place])    Diabetes mellitus    Hypertension    Insomnia    Neuropathy    Past Surgical History:  Past Surgical History:  Procedure Laterality Date   RECTAL SURGERY     Social History:  reports that she has been smoking. She has never used smokeless tobacco. She reports that she does not drink alcohol and does not use drugs. Family History:  Family History  Problem Relation Age of Onset   Heart disease Father    Crohn's disease Father    Colon cancer Neg Hx    Rectal cancer Neg Hx    Esophageal cancer Neg Hx      HOME MEDICATIONS: Allergies as of 03/02/2021       Reactions   Ceclor [cefaclor] Hives        Medication List        Accurate as of  March 01, 2021 10:00 AM. If you have any questions, ask your nurse or doctor.          Accu-Chek Guide Me w/Device Kit 1 Device by Does not apply route in the morning, at noon, in the evening, and at bedtime.   Accu-Chek Guide test strip Generic drug: glucose blood 1 each by Other route in the morning, at noon, in the evening, and at bedtime. Use as instructed   atorvastatin 40 MG tablet Commonly known as: LIPITOR   dapagliflozin propanediol 10 MG Tabs tablet Commonly known as: Farxiga Take 1 tablet (10 mg total) by mouth daily before breakfast.   Dexcom G6 Sensor Misc 1 Device by Does not apply route as directed.   Dexcom G6 Transmitter Misc 1 Device by Does not apply route as directed.   escitalopram 20 MG tablet Commonly  known as: LEXAPRO Take 20 mg by mouth daily.   gabapentin 300 MG capsule Commonly known as: NEURONTIN Take 600-900 mg by mouth 4 (four) times daily.   insulin lispro 100 UNIT/ML KwikPen Commonly known as: HumaLOG KwikPen Max daily 50 units What changed:  how much to take how to take this when to take this   Insulin Pen Needle 32G X 4 MM Misc 1 Device by Does not apply route in the morning, at noon, in the evening, and at bedtime.   Lantus SoloStar 100 UNIT/ML Solostar Pen Generic drug: insulin glargine Inject 15-20 Units into the skin at bedtime.   lisinopril 10 MG tablet Commonly known as: ZESTRIL Take 10 mg by mouth daily.   metFORMIN 500 MG tablet Commonly known as: GLUCOPHAGE Take 500 mg by mouth 2 (two) times daily with a meal.   promethazine 25 MG tablet Commonly known as: PHENERGAN promethazine 25 mg tablet  Take 1 tablet every 4 hours by oral route as needed.   Tyler Aas FlexTouch 100 UNIT/ML FlexTouch Pen Generic drug: insulin degludec Inject 20 Units into the skin daily.   zolpidem 10 MG tablet Commonly known as: AMBIEN Take 10 mg by mouth at bedtime as needed for sleep.         OBJECTIVE:   Vital Signs: There were no vitals taken for this visit.  Wt Readings from Last 3 Encounters:  02/26/21 207 lb (93.9 kg)  01/26/21 202 lb 6.4 oz (91.8 kg)  10/20/20 191 lb 2 oz (86.7 kg)     Exam: General: Pt appears well and is in NAD  Lungs: Clear with good BS bilat with no rales, rhonchi, or wheezes  Heart: RRR with normal S1 and S2 and no gallops; no murmurs; no rub  Abdomen: Normoactive bowel sounds, soft, nontender, without masses or organomegaly palpable  Extremities: No pretibial edema.   Neuro: MS is good with appropriate affect, pt is alert and Ox3   DM foot exam: 10/20/2020   The skin of the feet is  without sores or ulcerations. The pedal pulses are 2+ on right and 2+ on left. The sensation is decreased to a screening 5.07, 10 gram  monofilament bilaterally        DATA REVIEWED:  Lab Results  Component Value Date   HGBA1C 12.8 (A) 10/20/2020   HGBA1C (H) 08/05/2010    07/17/2020 A1c 12.2%  BUN/Cr 9/0.58 GFR 112 Ca 9.9 Alb/Cr ratio 326  HDL 40 Tg 359 LDL 123    ASSESSMENT / PLAN / RECOMMENDATIONS:   1) Type 2 Diabetes Mellitus, Poorly controlled, With neuropathic complications and microabuminuria - Most recent A1c of  12.0 %. Goal A1c < 7.0 %.    Her A1c is still at 12.0% , despite supposedly taking 50 units of basal insulin and 20 units of prandial insulin with meals, I personally doubt that she is taking any insulin as there's no way her insulin will remain at 12.0% despite adding an addition 60 units of insulin a day  ( on her last visit she stated she was not taking any )  I again advised her to bring her meter on next visit  She did not get the dexcom, she tells me the pharmacy did not have the prescription but after my assistant spoke to them today it appears they needs prior auth.  She is on much higher doses of insulin then previously discussed, will not change any today due to lack of glucose data I am going to increase [place]  She has crohn's disease , will try and avoid any medications that could cause GI side effects  Unfortunately , if the pt is NOT going to take her medications as prescribed there's not much I can provide other then advise and counseling about the importance of glycemic control  Pt to have BMP drawn today      MEDICATIONS: Continue Metformin 500 mg , 1 tablet Twice daily  Increase Farxiga to 10  mg, 1 tablet in the morning  Continue Tresiba/lantus 50 units once daily  Continue Humalog 20 units with each meal    EDUCATION / INSTRUCTIONS: BG monitoring instructions: Patient is instructed to check her blood sugars 3 times a day, before each meal . Call [facility] if: BG persistently < 70  I reviewed the Rule of 15 for the treatment of hypoglycemia in  detail with the patient. Literature supplied.   2) Diabetic complications:  Eye: Does not have known diabetic retinopathy.  Neuro/ Feet: Does have known diabetic peripheral neuropathy .  Renal: Patient does not have known baseline CKD. She   is not on an ACEI/ARB at present.     3) Dyslipidemia: Patient is on a statin. LDL above goal. Discussed cardiolvascular benefits and encouraged compliance. If LDL remains above goal, will consider Zetia      Medication Continue atorvastatin 40 mg daily     F/U in 4 months     Signed electronically by: Mack Guise, MD  [facility]  [facility] [address] [place], [place] [address] Phone: [phone] FAX: [phone]   CC: [facility] [address] [place] [address] Phone: [phone]  Fax: [phone]  Return to Endocrinology clinic as below: Future Appointments  Date Time Provider [place]  03/02/2021  7:30 AM Alexina Niccoli, Melanie Crazier, MD [id number]

## 2021-03-02 ENCOUNTER — Ambulatory Visit: Payer: Medicaid Other | Admitting: Internal Medicine

## 2021-06-08 ENCOUNTER — Ambulatory Visit: Payer: Medicaid Other | Admitting: Internal Medicine

## 2021-06-30 ENCOUNTER — Other Ambulatory Visit (HOSPITAL_COMMUNITY): Payer: Self-pay

## 2021-06-30 ENCOUNTER — Telehealth: Payer: Self-pay

## 2021-06-30 NOTE — Telephone Encounter (Signed)
Patient Advocate Encounter   Received notification from [facility] that prior authorization renewal for Dexcom G6 Sensors is required by his/her insurance [employer].   PA submitted on 06/30/21  Key#: [id number]  Status is pending     Clinic will continue to follow:  Patient Advocate Fax: [phone]

## 2021-07-01 ENCOUNTER — Other Ambulatory Visit (HOSPITAL_COMMUNITY): Payer: Self-pay

## 2021-07-01 NOTE — Telephone Encounter (Signed)
Patient Advocate Encounter  Prior Authorization Renewal for Dexcom G6 sensors has been approved.    PA# [id number]  Effective dates: 06/30/21 through 06/30/22  RTS - next fill is 07/22/21  Spoke with Pharmacy to Process.  Patient Advocate Fax: [phone]

## 2021-11-02 ENCOUNTER — Other Ambulatory Visit: Payer: Self-pay | Admitting: Internal Medicine

## 2021-11-02 NOTE — Telephone Encounter (Signed)
Patient is aware that she needs to keep follow up appointment in the [place] location

## 2021-12-02 ENCOUNTER — Other Ambulatory Visit (HOSPITAL_COMMUNITY): Payer: Self-pay

## 2021-12-02 ENCOUNTER — Telehealth: Payer: Self-pay | Admitting: Pharmacy Technician

## 2021-12-02 NOTE — Telephone Encounter (Signed)
Patient Advocate Encounter  Prior Authorization for [name] 10mg  has been approved.    PA# PA Case ID: Effective dates:  through 11/18/22  Per Test Claim Patients co-pay is $4.

## 2021-12-14 ENCOUNTER — Other Ambulatory Visit: Payer: Self-pay | Admitting: Internal Medicine

## 2022-01-11 ENCOUNTER — Other Ambulatory Visit: Payer: Self-pay | Admitting: Internal Medicine

## 2022-02-11 ENCOUNTER — Other Ambulatory Visit: Payer: Self-pay | Admitting: Internal Medicine

## 2022-03-04 ENCOUNTER — Ambulatory Visit (INDEPENDENT_AMBULATORY_CARE_PROVIDER_SITE_OTHER): Payer: Medicaid Other | Admitting: Internal Medicine

## 2022-03-04 ENCOUNTER — Encounter: Payer: Self-pay | Admitting: Internal Medicine

## 2022-03-04 VITALS — BP 124/74 | HR 98 | Ht 63.0 in | Wt 209.0 lb

## 2022-03-04 DIAGNOSIS — E1165 Type 2 diabetes mellitus with hyperglycemia: Secondary | ICD-10-CM

## 2022-03-04 DIAGNOSIS — E1142 Type 2 diabetes mellitus with diabetic polyneuropathy: Secondary | ICD-10-CM

## 2022-03-04 DIAGNOSIS — Z794 Long term (current) use of insulin: Secondary | ICD-10-CM | POA: Diagnosis not present

## 2022-03-04 LAB — POCT GLYCOSYLATED HEMOGLOBIN (HGB A1C): Hemoglobin A1C: 10 % — AB (ref 4.0–5.6)

## 2022-03-04 MED ORDER — OMNIPOD 5 DEXG7G6 PODS GEN 5 MISC
1.0000 | 3 refills | Status: DC
Start: 2022-03-04 — End: 2022-05-02

## 2022-03-04 MED ORDER — DAPAGLIFLOZIN PROPANEDIOL 10 MG PO TABS
10.0000 mg | ORAL_TABLET | Freq: Every day | ORAL | 2 refills | Status: DC
Start: 2022-03-04 — End: 2022-05-02

## 2022-03-04 MED ORDER — METFORMIN HCL ER 500 MG PO TB24
500.0000 mg | ORAL_TABLET | Freq: Two times a day (BID) | ORAL | 2 refills | Status: DC
Start: 2022-03-04 — End: 2022-09-07

## 2022-03-04 MED ORDER — TRESIBA FLEXTOUCH 100 UNIT/ML ~~LOC~~ SOPN
32.0000 [IU] | PEN_INJECTOR | Freq: Every day | SUBCUTANEOUS | 2 refills | Status: DC
Start: 2022-03-04 — End: 2022-05-02

## 2022-03-04 MED ORDER — OMNIPOD 5 DEXG7G6 INTRO GEN 5 KIT
1.0000 | PACK | 0 refills | Status: DC
Start: 2022-03-04 — End: 2023-07-05

## 2022-03-04 MED ORDER — INSULIN LISPRO (1 UNIT DIAL) 100 UNIT/ML (KWIKPEN)
PEN_INJECTOR | SUBCUTANEOUS | 2 refills | Status: DC
Start: 2022-03-04 — End: 2022-06-07

## 2022-03-04 NOTE — Progress Notes (Unsigned)
Name: [name]  Age/ Sex: 46 y.o., female   MRN/ DOB: [id number], Oct 13, 1975     PCP: [facility]   Reason for Endocrinology Evaluation: Type 2 Diabetes Mellitus  Initial Endocrine Consultative Visit: 10/20/2020    PATIENT IDENTIFIER: [name] is a 46 y.o. female with a past medical history of T2DM, Dyslipidemia, Crohn's disease and HTN. The patient has followed with Endocrinology clinic since 10/20/2020 for consultative assistance with management of her diabetes.  DIABETIC HISTORY:  [name] was diagnosed with DM as gestational diabetes at age 78, by age 57 she was started on Metformin . Insulin was started in her 30's. Her hemoglobin A1c has ranged from 7.9% in 2012, peaking at 12.8% in 2022  ON her initial visit to our clinic  she had an A1c of 12.8 % . She had Metformin, lantus and Humalog on her list but had not taken insulin in 2 months by then. We continued Metformin, started [place] and restarted insulin .  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  The pt has  NOT been  to our clinic in 13 months. She checks her blood sugars occasionally. The patient has not had hypoglycemic episodes since the last clinic visit. She did not bring the meter today   She has Crohn's with chronic diarrhea  She has NOT noted any worsening diarrhea , she gets steroids every few months for    HOME DIABETES REGIMEN:  Metformin 500 mg , 1 tablet Twice daily  Farxiga 10 mg, 1 tablet in the morning  Tresiba 15-18 units BID  Humalog 15 units with each meal  CF : Humalog ( BG- 130/40)      Statin: yes ACE-I/ARB:  yes Prior Diabetic Education: yes   METER DOWNLOAD SUMMARY: Did not bring     DIABETIC COMPLICATIONS: Microvascular complications:  neuropathy Denies: CKD, retinopathy Last Eye Exam: Completed 10/2020  Macrovascular complications:   Denies: CAD, CVA, PVD   HISTORY:  Past Medical History:  Past Medical History:  Diagnosis Date   Anxiety    Crohn's disease ([place])    Diabetes mellitus    Hypertension    Insomnia    Neuropathy    Past Surgical History:  Past Surgical History:  Procedure Laterality Date   RECTAL SURGERY     Social History:  reports that she has been smoking. She has never used smokeless tobacco. She reports that she does not drink alcohol and does not use drugs. Family History:  Family History  Problem Relation Age of Onset   Heart disease Father    Crohn's disease Father    Colon cancer Neg Hx    Rectal cancer Neg Hx    Esophageal cancer Neg Hx      HOME MEDICATIONS: Allergies as of 03/04/2022       Reactions   Ceclor [cefaclor] Hives        Medication List        Accurate as of March 04, 2022 12:18 PM. If you have any questions, ask your nurse or doctor.  Accu-Chek Guide Me w/Device Kit 1 Device by Does not apply route in the morning, at noon, in the evening, and at bedtime.   Accu-Chek Guide test strip Generic drug: glucose blood 1 each by Other route in the morning, at noon, in the evening, and at bedtime. Use as instructed   atorvastatin 40 MG tablet Commonly known as: LIPITOR   Dexcom G6 Sensor Misc USE 1 device AS DIRECTED   Dexcom G6 Transmitter Misc USE AS DIRECTED with dexcom   escitalopram 20 MG tablet Commonly known as: LEXAPRO Take 20 mg by mouth daily.   Farxiga 10 MG Tabs tablet Generic drug: dapagliflozin propanediol TAKE 1 TABLET BY MOUTH EVERY DAY BEFORE breakfast   gabapentin 300 MG capsule Commonly known as: NEURONTIN Take 600-900 mg by mouth 4 (four) times daily.   insulin lispro  100 UNIT/ML KwikPen Commonly known as: HumaLOG KwikPen Max daily 50 units What changed:  how much to take how to take this when to take this   Insulin Pen Needle 32G X 4 MM Misc 1 Device by Does not apply route in the morning, at noon, in the evening, and at bedtime.   Lantus SoloStar 100 UNIT/ML Solostar Pen Generic drug: insulin glargine Inject 15-20 Units into the skin at bedtime.   lisinopril 10 MG tablet Commonly known as: ZESTRIL Take 10 mg by mouth daily.   metFORMIN 500 MG tablet Commonly known as: GLUCOPHAGE Take 500 mg by mouth 2 (two) times daily with a meal.   promethazine 25 MG tablet Commonly known as: PHENERGAN promethazine 25 mg tablet  Take 1 tablet every 4 hours by oral route as needed.   Tyler Aas FlexTouch 100 UNIT/ML FlexTouch Pen Generic drug: insulin degludec Inject 20 Units into the skin daily.   zolpidem 10 MG tablet Commonly known as: AMBIEN Take 10 mg by mouth at bedtime as needed for sleep.         OBJECTIVE:   Vital Signs: BP 124/74 (BP Location: Left Arm, Patient Position: Sitting, Cuff Size: Small)   Pulse 98   Ht 5' 3"  (1.6 m)   Wt 209 lb (94.8 kg)   SpO2 100%   BMI 37.02 kg/m   Wt Readings from Last 3 Encounters:  03/04/22 209 lb (94.8 kg)  02/26/21 207 lb (93.9 kg)  01/26/21 202 lb 6.4 oz (91.8 kg)     Exam: General: Pt appears well and is in NAD  Lungs: Clear with good BS bilat   Heart: RRR  Abdomen: Normoactive bowel sounds, soft, nontender, without masses or organomegaly palpable  Extremities: No pretibial edema.   Neuro: MS is good with appropriate affect, pt is alert and Ox3   DM foot exam:03/04/2022   The skin of the feet is  without sores or ulcerations. The pedal pulses are 2+ on right and 2+ on left. The sensation is absent  to a screening 5.07, 10 gram monofilament bilaterally        DATA REVIEWED:  Lab Results  Component Value Date   HGBA1C 12.8 (A) 10/20/2020   HGBA1C (H) 08/05/2010    Latest  Reference Range & Units 02/22/21 08:27  Sodium 135 - 145 mmol/L 141  Potassium 3.5 - 5.1 mmol/L 3.8  Chloride 98 - 111 mmol/L 103  CO2 22 - 32 mmol/L 25  Glucose 70 - 99 mg/dL 217 (H)  BUN 6 - 20 mg/dL 13  Creatinine 0.44 - 1.00 mg/dL 0.50  Calcium 8.9 - 10.3 mg/dL 9.1  Anion gap 5 -  15  13  Phosphorus 2.5 - 4.6 mg/dL 3.7  Albumin 3.5 - 5.0 g/dL 4.0  GFR, Estimated >60 mL/min >60  (H): Data is abnormally high  ASSESSMENT / PLAN / RECOMMENDATIONS:   1) Type 2 Diabetes Mellitus, Poorly controlled, With neuropathic complications and microabuminuria - Most recent A1c of 10.0 %. Goal A1c < 7.0 %.     - A1c remains above goal despite decreasing from 12.0% to 10.0%  - I did express my concern with being on Ozempic and having Crohn's disease , I have recommended discontinuing this  - Intolerant to higher doses of metformin, will switch to XR - Pt is interested in an insulin pump , Omnipod 5 and dexcom G6 sent to pharmacy, A referral to our CDE for training has been done  - Will change prandial dose of insulin and provide her with a correction scale to be used before each meal  - will also change tresiba from BID dosing to once daily dosing as below  MEDICATIONS: Continue Metformin 500 mg , 1 tablet Twice daily  Continue  Farxiga 10  mg, 1 tablet in the morning  Change Tresiba 32 units once daily  Increase Humalog 18 units with each meal  Start Correction scale : HUmalog( BG-130/20)   EDUCATION / INSTRUCTIONS: BG monitoring instructions: Patient is instructed to check her blood sugars 3 times a day, before each meal . Call [facility] if: BG persistently < 70  I reviewed the Rule of 15 for the treatment of hypoglycemia in detail with the patient. Literature supplied.   2) Diabetic complications:  Eye: Does not have known diabetic retinopathy.  Neuro/ Feet: Does have known diabetic peripheral neuropathy .  Renal: Patient does not have known baseline CKD. She   is  not on an ACEI/ARB at present.     3) Dyslipidemia: Patient is on a statin. LDL above goal. Discussed cardiolvascular benefits and encouraged compliance. If LDL remains above goal, will consider Zetia      Medication Continue atorvastatin 40 mg daily     F/U in 4 months     Signed electronically by: Mack Guise, MD  [facility]  [facility] [address] [place], [place] [address] Phone: [phone] FAX: [phone]   CC: [facility] [address] [place] [address] Phone: [phone]  Fax: [phone]  Return to Endocrinology clinic as below: No future appointments.

## 2022-03-04 NOTE — Patient Instructions (Addendum)
-   Continue Metformin 500 mg , 1 tablet Twice daily  - Continue  Farxiga 10  mg, 1 tablet in the morning  - Continue Tresiba 32 units once daily  - Continue Humalog 18 units with each meal    Humalog  correctional insulin: ADD extra units on insulin to your meal-time Novolog dose if your blood sugars are higher than 150. Use the scale below to help guide you:   Blood sugar before meal Number of units to inject  Less than 150 0 unit  151 -  170 1 units  171 -  190 2 units  191 -  210 3 units  211 -  230 4 units  231 -  250 5 units  251 -  270 6 units  271 -  290 7 units  291 -  310 8 units  311 - 330 9 units   331 - 350 10 units        HOW TO TREAT LOW BLOOD SUGARS (Blood sugar LESS THAN 70 MG/DL) Please follow the RULE OF 15 for the treatment of hypoglycemia treatment (when your (blood sugars are less than 70 mg/dL)   STEP 1: Take 15 grams of carbohydrates when your blood sugar is low, which includes:  3-4 GLUCOSE TABS  OR 3-4 OZ OF JUICE OR REGULAR SODA OR ONE TUBE OF GLUCOSE GEL    STEP 2: RECHECK blood sugar in 15 MINUTES STEP 3: If your blood sugar is still low at the 15 minute recheck --> then, go back to STEP 1 and treat AGAIN with another 15 grams of carbohydrates.

## 2022-03-06 DIAGNOSIS — Z794 Long term (current) use of insulin: Secondary | ICD-10-CM | POA: Insufficient documentation

## 2022-03-06 DIAGNOSIS — E1142 Type 2 diabetes mellitus with diabetic polyneuropathy: Secondary | ICD-10-CM | POA: Insufficient documentation

## 2022-03-11 ENCOUNTER — Telehealth: Payer: Self-pay | Admitting: Registered"

## 2022-03-11 NOTE — Telephone Encounter (Signed)
Pt states she has the pods but not the kit. I called her pharmacy and they said they will deliver the kit on Tues (Oct 10). I sent pt an email with instructions on how to register her Omnipod and asked her to let me know when this step is done. Also to let me know if she has any issues with getting the product register. Pt voiced understanding. I anticipate we will be able to train her within 2 weeks. 

## 2022-04-08 ENCOUNTER — Telehealth: Payer: Self-pay

## 2022-04-08 NOTE — Telephone Encounter (Signed)
Patient LVM stating that a prior Josem Kaufmann is required for her [place]. Are you all able to start this? Thank you!

## 2022-04-11 ENCOUNTER — Other Ambulatory Visit (HOSPITAL_COMMUNITY): Payer: Self-pay

## 2022-04-15 ENCOUNTER — Encounter: Payer: Medicaid Other | Admitting: Registered"

## 2022-04-15 ENCOUNTER — Ambulatory Visit: Payer: Medicaid Other | Admitting: Podiatry

## 2022-04-18 ENCOUNTER — Emergency Department (HOSPITAL_COMMUNITY): Payer: Medicaid Other

## 2022-04-18 ENCOUNTER — Encounter (HOSPITAL_COMMUNITY): Payer: Self-pay

## 2022-04-18 ENCOUNTER — Other Ambulatory Visit: Payer: Self-pay

## 2022-04-18 ENCOUNTER — Inpatient Hospital Stay (HOSPITAL_COMMUNITY)
Admission: EM | Admit: 2022-04-18 | Discharge: 2022-04-22 | DRG: 280 | Disposition: A | Discharge status: Home / Self Care | Payer: Medicaid Other | Attending: Cardiovascular Disease | Admitting: Cardiovascular Disease

## 2022-04-18 DIAGNOSIS — E1165 Type 2 diabetes mellitus with hyperglycemia: Secondary | ICD-10-CM | POA: Diagnosis present

## 2022-04-18 DIAGNOSIS — J810 Acute pulmonary edema: Secondary | ICD-10-CM

## 2022-04-18 DIAGNOSIS — I214 Non-ST elevation (NSTEMI) myocardial infarction: Principal | ICD-10-CM

## 2022-04-18 DIAGNOSIS — E669 Obesity, unspecified: Secondary | ICD-10-CM | POA: Diagnosis present

## 2022-04-18 DIAGNOSIS — R570 Cardiogenic shock: Secondary | ICD-10-CM | POA: Diagnosis present

## 2022-04-18 DIAGNOSIS — I2510 Atherosclerotic heart disease of native coronary artery without angina pectoris: Secondary | ICD-10-CM | POA: Diagnosis present

## 2022-04-18 DIAGNOSIS — Z79899 Other long term (current) drug therapy: Secondary | ICD-10-CM

## 2022-04-18 DIAGNOSIS — Z794 Long term (current) use of insulin: Secondary | ICD-10-CM

## 2022-04-18 DIAGNOSIS — K219 Gastro-esophageal reflux disease without esophagitis: Secondary | ICD-10-CM | POA: Diagnosis present

## 2022-04-18 DIAGNOSIS — Z9641 Presence of insulin pump (external) (internal): Secondary | ICD-10-CM | POA: Diagnosis present

## 2022-04-18 DIAGNOSIS — I110 Hypertensive heart disease with heart failure: Secondary | ICD-10-CM | POA: Diagnosis present

## 2022-04-18 DIAGNOSIS — F1290 Cannabis use, unspecified, uncomplicated: Secondary | ICD-10-CM

## 2022-04-18 DIAGNOSIS — I249 Acute ischemic heart disease, unspecified: Secondary | ICD-10-CM | POA: Diagnosis present

## 2022-04-18 DIAGNOSIS — Z881 Allergy status to other antibiotic agents status: Secondary | ICD-10-CM

## 2022-04-18 DIAGNOSIS — R112 Nausea with vomiting, unspecified: Secondary | ICD-10-CM

## 2022-04-18 DIAGNOSIS — K5090 Crohn's disease, unspecified, without complications: Secondary | ICD-10-CM | POA: Diagnosis present

## 2022-04-18 DIAGNOSIS — Z6834 Body mass index (BMI) 34.0-34.9, adult: Secondary | ICD-10-CM

## 2022-04-18 DIAGNOSIS — E1143 Type 2 diabetes mellitus with diabetic autonomic (poly)neuropathy: Secondary | ICD-10-CM | POA: Diagnosis present

## 2022-04-18 DIAGNOSIS — F419 Anxiety disorder, unspecified: Secondary | ICD-10-CM | POA: Diagnosis present

## 2022-04-18 DIAGNOSIS — E860 Dehydration: Secondary | ICD-10-CM | POA: Diagnosis present

## 2022-04-18 DIAGNOSIS — J9601 Acute respiratory failure with hypoxia: Secondary | ICD-10-CM

## 2022-04-18 DIAGNOSIS — Z8611 Personal history of tuberculosis: Secondary | ICD-10-CM

## 2022-04-18 DIAGNOSIS — Z20822 Contact with and (suspected) exposure to covid-19: Secondary | ICD-10-CM | POA: Diagnosis present

## 2022-04-18 DIAGNOSIS — F17210 Nicotine dependence, cigarettes, uncomplicated: Secondary | ICD-10-CM | POA: Diagnosis present

## 2022-04-18 DIAGNOSIS — K3184 Gastroparesis: Secondary | ICD-10-CM | POA: Diagnosis present

## 2022-04-18 DIAGNOSIS — Z8249 Family history of ischemic heart disease and other diseases of the circulatory system: Secondary | ICD-10-CM

## 2022-04-18 DIAGNOSIS — Z7984 Long term (current) use of oral hypoglycemic drugs: Secondary | ICD-10-CM

## 2022-04-18 DIAGNOSIS — R809 Proteinuria, unspecified: Secondary | ICD-10-CM | POA: Diagnosis present

## 2022-04-18 DIAGNOSIS — Z7985 Long-term (current) use of injectable non-insulin antidiabetic drugs: Secondary | ICD-10-CM

## 2022-04-18 DIAGNOSIS — G4700 Insomnia, unspecified: Secondary | ICD-10-CM | POA: Diagnosis present

## 2022-04-18 DIAGNOSIS — I5021 Acute systolic (congestive) heart failure: Secondary | ICD-10-CM | POA: Diagnosis present

## 2022-04-18 DIAGNOSIS — E785 Hyperlipidemia, unspecified: Secondary | ICD-10-CM | POA: Diagnosis present

## 2022-04-18 HISTORY — DX: Type 2 diabetes mellitus with ketoacidosis without coma: E11.10

## 2022-04-18 LAB — TROPONIN I (HIGH SENSITIVITY)
Troponin I (High Sensitivity): 344 ng/L (ref <18)
Troponin I (High Sensitivity): 713 ng/L (ref <18)

## 2022-04-18 LAB — BLOOD GAS, VENOUS
Acid-Base Excess: 2.5 mmol/L — ABNORMAL HIGH (ref 0.0–2.0)
Acid-Base Excess: 1.4 mmol/L (ref 0.0–2.0)
Bicarbonate: 26.3 mmol/L (ref 20.0–28.0)
Bicarbonate: 24.9 mmol/L (ref 20.0–28.0)
O2 Saturation: 60.7 %
O2 Saturation: 90.5 %
Patient temperature: 37
Patient temperature: 37
pCO2, Ven: 37 mmHg — ABNORMAL LOW (ref 44–60)
pCO2, Ven: 35 mmHg — ABNORMAL LOW (ref 44–60)
pH, Ven: 7.46 — ABNORMAL HIGH (ref 7.25–7.43)
pH, Ven: 7.46 — ABNORMAL HIGH (ref 7.25–7.43)
pO2, Ven: 33 mmHg (ref 32–45)
pO2, Ven: 55 mmHg — ABNORMAL HIGH (ref 32–45)

## 2022-04-18 LAB — COMPREHENSIVE METABOLIC PANEL
ALT: 20 U/L (ref 0–44)
AST: 21 U/L (ref 15–41)
Albumin: 4.3 g/dL (ref 3.5–5.0)
Alkaline Phosphatase: 74 U/L (ref 38–126)
Anion gap: 14 (ref 5–15)
BUN: 10 mg/dL (ref 6–20)
CO2: 22 mmol/L (ref 22–32)
Calcium: 9.3 mg/dL (ref 8.9–10.3)
Chloride: 104 mmol/L (ref 98–111)
Creatinine, Ser: 0.61 mg/dL (ref 0.44–1.00)
GFR, Estimated: >60 mL/min (ref >60)
Glucose, Bld: 257 mg/dL — ABNORMAL HIGH (ref 70–99)
Potassium: 3.4 mmol/L — ABNORMAL LOW (ref 3.5–5.1)
Sodium: 140 mmol/L (ref 135–145)
Total Bilirubin: 0.9 mg/dL (ref 0.3–1.2)
Total Protein: 8.1 g/dL (ref 6.5–8.1)

## 2022-04-18 LAB — CBC WITH DIFFERENTIAL/PLATELET
Abs Immature Granulocytes: 0.11 10*3/uL — ABNORMAL HIGH (ref 0.00–0.07)
Basophils Absolute: 0.1 10*3/uL (ref 0.0–0.1)
Basophils Relative: 0 %
Eosinophils Absolute: 0 10*3/uL (ref 0.0–0.5)
Eosinophils Relative: 0 %
HCT: 47.7 % — ABNORMAL HIGH (ref 36.0–46.0)
Hemoglobin: 16.3 g/dL — ABNORMAL HIGH (ref 12.0–15.0)
Immature Granulocytes: 1 %
Lymphocytes Relative: 6 %
Lymphs Abs: 1.4 10*3/uL (ref 0.7–4.0)
MCH: 29.2 pg (ref 26.0–34.0)
MCHC: 34.2 g/dL (ref 30.0–36.0)
MCV: 85.3 fL (ref 80.0–100.0)
Monocytes Absolute: 1 10*3/uL (ref 0.1–1.0)
Monocytes Relative: 5 %
Neutro Abs: 18.8 10*3/uL — ABNORMAL HIGH (ref 1.7–7.7)
Neutrophils Relative %: 88 %
Platelets: 327 10*3/uL (ref 150–400)
RBC: 5.59 MIL/uL — ABNORMAL HIGH (ref 3.87–5.11)
RDW: 13.3 % (ref 11.5–15.5)
WBC: 21.3 10*3/uL — ABNORMAL HIGH (ref 4.0–10.5)
nRBC: 0 % (ref 0.0–0.2)

## 2022-04-18 LAB — URINALYSIS, ROUTINE W REFLEX MICROSCOPIC
Bilirubin Urine: NEGATIVE
Glucose, UA: >=500 mg/dL — AB
Ketones, ur: 80 mg/dL — AB
Leukocytes,Ua: NEGATIVE
Nitrite: NEGATIVE
Protein, ur: >=300 mg/dL — AB
Specific Gravity, Urine: >1.046 — ABNORMAL HIGH (ref 1.005–1.030)
pH: 5 (ref 5.0–8.0)

## 2022-04-18 LAB — RESP PANEL BY RT-PCR (FLU A&B, COVID) ARPGX2
Influenza A by PCR: NEGATIVE
Influenza B by PCR: NEGATIVE
SARS Coronavirus 2 by RT PCR: NEGATIVE

## 2022-04-18 LAB — CBG MONITORING, ED: Glucose-Capillary: 228 mg/dL — ABNORMAL HIGH (ref 70–99)

## 2022-04-18 LAB — LIPASE, BLOOD: Lipase: 39 U/L (ref 11–51)

## 2022-04-18 LAB — HCG, QUANTITATIVE, PREGNANCY: hCG, Beta Chain, Quant, S: <1 m[IU]/mL (ref <5)

## 2022-04-18 LAB — BETA-HYDROXYBUTYRIC ACID
Beta-Hydroxybutyric Acid: 1.06 mmol/L — ABNORMAL HIGH (ref 0.05–0.27)
Beta-Hydroxybutyric Acid: 1.03 mmol/L — ABNORMAL HIGH (ref 0.05–0.27)

## 2022-04-18 LAB — BRAIN NATRIURETIC PEPTIDE: B Natriuretic Peptide: 136.4 pg/mL — ABNORMAL HIGH (ref 0.0–100.0)

## 2022-04-18 MED ORDER — METOCLOPRAMIDE HCL 5 MG/ML IJ SOLN
5.0000 mg | Freq: Once | INTRAMUSCULAR | Status: AC
  Administered 2022-04-18: 5 mg via INTRAVENOUS
  Filled 2022-04-18: qty 2

## 2022-04-18 MED ORDER — NITROGLYCERIN IN D5W 200-5 MCG/ML-% IV SOLN
0.0000 ug/min | INTRAVENOUS | Status: DC
  Administered 2022-04-19: 5 ug/min via INTRAVENOUS
  Filled 2022-04-18 (×2): qty 250

## 2022-04-18 MED ORDER — SODIUM CHLORIDE 0.9 % IV SOLN
25.0000 mg | Freq: Once | INTRAVENOUS | Status: AC
  Administered 2022-04-18: 25 mg via INTRAVENOUS
  Filled 2022-04-18: qty 25

## 2022-04-18 MED ORDER — IOHEXOL 350 MG/ML SOLN
80.0000 mL | Freq: Once | INTRAVENOUS | Status: AC | PRN
  Administered 2022-04-18: 80 mL via INTRAVENOUS

## 2022-04-18 MED ORDER — HEPARIN BOLUS VIA INFUSION
4000.0000 [IU] | Freq: Once | INTRAVENOUS | Status: AC
  Administered 2022-04-19: 4000 [IU] via INTRAVENOUS
  Filled 2022-04-18: qty 4000

## 2022-04-18 MED ORDER — HEPARIN (PORCINE) 25000 UT/250ML-% IV SOLN
1650.0000 [IU]/h | INTRAVENOUS | Status: DC
  Administered 2022-04-19: 900 [IU]/h via INTRAVENOUS
  Administered 2022-04-19: 1150 [IU]/h via INTRAVENOUS
  Administered 2022-04-20: 1300 [IU]/h via INTRAVENOUS
  Filled 2022-04-18 (×3): qty 250

## 2022-04-18 MED ORDER — ASPIRIN 81 MG PO CHEW
324.0000 mg | CHEWABLE_TABLET | Freq: Once | ORAL | Status: AC
  Administered 2022-04-18: 324 mg via ORAL
  Filled 2022-04-18: qty 4

## 2022-04-18 MED ORDER — PROMETHAZINE HCL 25 MG PO TABS
12.5000 mg | ORAL_TABLET | Freq: Once | ORAL | Status: AC
  Administered 2022-04-18: 12.5 mg via ORAL
  Filled 2022-04-18: qty 1

## 2022-04-18 MED ORDER — FUROSEMIDE 10 MG/ML IJ SOLN
40.0000 mg | Freq: Once | INTRAMUSCULAR | Status: AC
  Administered 2022-04-19: 40 mg via INTRAVENOUS
  Filled 2022-04-18: qty 4

## 2022-04-18 MED ORDER — LACTATED RINGERS IV BOLUS: 20.0000 mL/kg | Freq: Once | INTRAVENOUS | Status: DC

## 2022-04-18 MED ORDER — LACTATED RINGERS IV BOLUS
1000.0000 mL | Freq: Once | INTRAVENOUS | Status: AC
  Administered 2022-04-18: 1000 mL via INTRAVENOUS

## 2022-04-18 MED ORDER — DROPERIDOL 2.5 MG/ML IJ SOLN
1.2500 mg | Freq: Once | INTRAMUSCULAR | Status: AC
  Administered 2022-04-18: 1.25 mg via INTRAVENOUS
  Filled 2022-04-18: qty 2

## 2022-04-18 NOTE — ED Notes (Signed)
Pt O2 sats at 75% on 2L [place]. Pt placed at 6L on a simple mask. Wallace Cullens, DO at bedside. Orders placed for BiPAP. Pt reports SHOB.

## 2022-04-18 NOTE — Progress Notes (Signed)
RT notified that pt removed BIPAP herself and is refusing to wear it at this time.  Pt placed on 3 LPM [place] and tolerating well.  MD aware per RN.

## 2022-04-18 NOTE — ED Notes (Signed)
Patient transported to CT 

## 2022-04-18 NOTE — ED Notes (Signed)
Bedside commode at bedside pt will let someone know when she needs to use it.

## 2022-04-18 NOTE — ED Notes (Signed)
Pt noted to have O2 sats at 85% room air. Pt placed on 2L [place]. Pt transported to CT

## 2022-04-18 NOTE — Progress Notes (Signed)
ANTICOAGULATION CONSULT NOTE - Initial Consult  Pharmacy Consult for heparin Indication: chest pain/ACS  Allergies  Allergen Reactions   Ceclor [Cefaclor] Hives    Patient Measurements: Height: 5\' 4"  (162.6 cm) Weight: 90.7 kg (200 lb) IBW/kg (Calculated) : 54.7 Heparin Dosing Weight: 75kg  Vital Signs: Temp: 98 F (36.7 C) (11/13 1900) Temp Source: Oral (11/13 1501) BP: 147/111 (11/13 2235) Pulse Rate: 140 (11/13 2235)  Labs: Recent Labs    04/18/22 1453 04/18/22 1527 04/18/22 1535 04/18/22 2225  HGB 16.3*  --   --   --   HCT 47.7*  --   --   --   PLT 327  --   --   --   CREATININE  --   --  0.61  --   TROPONINIHS  --  344*  --  713*    Estimated Creatinine Clearance: 96.9 mL/min (by C-G formula based on SCr of 0.61 mg/dL).   Medical History: Past Medical History:  Diagnosis Date   Anxiety    Crohn's disease ([facility])    Diabetes mellitus    DKA (diabetic ketoacidosis) ([facility])    Hypertension    Insomnia    Neuropathy     Assessment: 46yo female with medical history of insulin-dependent DM, crohn disease, anxiety, daily cannabis use presents to the ED with complaint of abd pain, N/V, diarrhea.  Pharmacy consulted to dose heparin drip for ACS/STEMI.  No prior AC noted  Hgb 16.3, Plts 327, Trop 713   Goal of Therapy:  Heparin level 0.3-0.7 units/ml Monitor platelets by anticoagulation protocol: Yes   Plan:  Baseline labs ordered STAT Heparin bolus 4000 units x 1 Start heparin drip at 900 units/hr Heparin level in 8 hours Daily CBC   46yo RPh 04/18/2022, 11:43 PM

## 2022-04-18 NOTE — ED Provider Notes (Signed)
[facility] DEPT Provider Note   CSN: [id number] Arrival date & time: 04/18/22  1444     History {Add pertinent medical, surgical, social history, OB history to HPI:1} Chief Complaint  Patient presents with   Emesis   Hyperglycemia    [name] is a 46 y.o. female.  Patient as above with significant medical history as below, including insulin-dependent diabetes mellitus, crohn disease, anxiety, daily cannabis use who presents to the ED with complaint of abdominal pain, nausea, vomiting, diarrhea.  Symptom onset this morning when she woke up.  Last night she had some mild abdominal cramping.  Ongoing nausea, vomiting, diarrhea the last few hours.  No fevers.  No recent travel or sick contacts.  Blood sugars running high at home, glucose over 400.  No change to her urination.  She is having diffuse abdominal cramping, no prior abdominal surgeries.  No rashes.  No medications PTA. she also reports daily tobacco and marijuana use.  Similar Presentation 9/22; attributed to gastroenteritis/hyperglycemic crisis, she left AMA. Follows with [facility] Dr Fuller Plan   Past Medical History:  Diagnosis Date   Anxiety    Crohn's disease ([place])    Diabetes mellitus    DKA (diabetic ketoacidosis) ([place])    Hypertension    Insomnia    Neuropathy     Past Surgical History:  Procedure Laterality Date   RECTAL SURGERY       The history is provided by the patient and a relative (daughter). No language interpreter was used.  Emesis Associated symptoms: abdominal pain and diarrhea   Associated symptoms: no cough, no fever and no headaches   Hyperglycemia Associated symptoms: abdominal pain, fatigue, nausea and vomiting   Associated symptoms: no chest pain, no dysuria, no fever and no shortness of breath        Home Medications Prior to Admission medications   Medication Sig Start Date End Date Taking? Authorizing Provider  ACCU-CHEK GUIDE test strip 1 each by  Other route in the morning, at noon, in the evening, and at bedtime. Use as instructed 01/26/21   Shamleffer, Melanie Crazier, MD  atorvastatin (LIPITOR) 40 MG tablet  10/02/20   [provider]  Blood Glucose Monitoring Suppl (ACCU-CHEK GUIDE ME) w/Device KIT 1 Device by Does not apply route in the morning, at noon, in the evening, and at bedtime. 01/26/21   Shamleffer, Melanie Crazier, MD  Continuous Blood Gluc Sensor (DEXCOM G6 SENSOR) MISC USE 1 device AS DIRECTED 12/14/21   Shamleffer, Melanie Crazier, MD  Continuous Blood Gluc Transmit (DEXCOM G6 TRANSMITTER) MISC USE AS DIRECTED with dexcom 02/11/22   Shamleffer, Melanie Crazier, MD  dapagliflozin propanediol (FARXIGA) 10 MG TABS tablet Take 1 tablet (10 mg total) by mouth daily. 03/04/22   Shamleffer, Melanie Crazier, MD  escitalopram (LEXAPRO) 20 MG tablet Take 20 mg by mouth daily. 09/11/20   [provider]  gabapentin (NEURONTIN) 300 MG capsule Take 600-900 mg by mouth 4 (four) times daily.    [provider]  insulin degludec (TRESIBA FLEXTOUCH) 100 UNIT/ML FlexTouch Pen Inject 32 Units into the skin daily. 03/04/22   Shamleffer, Melanie Crazier, MD  Insulin Disposable Pump (OMNIPOD 5 G6 INTRO, GEN 5,) KIT 1 Device by Does not apply route every other day. 03/04/22   Shamleffer, Melanie Crazier, MD  Insulin Disposable Pump (OMNIPOD 5 G6 POD, GEN 5,) MISC 1 Device by Does not apply route every other day. 03/04/22   Shamleffer, Melanie Crazier, MD  insulin lispro (HUMALOG  KWIKPEN) 100 UNIT/ML KwikPen Max daily 60 units 03/04/22   Shamleffer, Melanie Crazier, MD  Insulin Pen Needle 32G X 4 MM MISC 1 Device by Does not apply route in the morning, at noon, in the evening, and at bedtime. 10/20/20   Shamleffer, Melanie Crazier, MD  lisinopril (ZESTRIL) 10 MG tablet Take 10 mg by mouth daily. 10/02/20   [provider]  metFORMIN (GLUCOPHAGE-XR) 500 MG 24 hr tablet Take 1 tablet (500 mg total) by mouth 2 (two) times daily with  a meal. 03/04/22   Shamleffer, Melanie Crazier, MD  promethazine (PHENERGAN) 25 MG tablet promethazine 25 mg tablet  Take 1 tablet every 4 hours by oral route as needed.    [provider]  zolpidem (AMBIEN) 10 MG tablet Take 10 mg by mouth at bedtime as needed for sleep. 10/02/20   [provider]      Allergies    Ceclor [cefaclor]    Review of Systems   Review of Systems  Constitutional:  Positive for fatigue. Negative for activity change and fever.  HENT:  Negative for facial swelling and trouble swallowing.   Eyes:  Negative for discharge and redness.  Respiratory:  Negative for cough and shortness of breath.   Cardiovascular:  Negative for chest pain and palpitations.  Gastrointestinal:  Positive for abdominal pain, diarrhea, nausea and vomiting.  Genitourinary:  Negative for dysuria and flank pain.  Musculoskeletal:  Negative for back pain and gait problem.  Skin:  Negative for pallor and rash.  Neurological:  Negative for syncope and headaches.    Physical Exam Updated Vital Signs BP (!) 168/124 (BP Location: Right Arm)   Pulse (!) 107   Temp 97.9 F (36.6 C) (Oral)   Ht _0  (1.626 m)   Wt 90.7 kg   LMP 08/03/2019 (Approximate)   SpO2 100%   BMI 34.33 kg/m  Physical Exam Vitals and nursing note reviewed.  Constitutional:      General: She is not in acute distress.    Appearance: Normal appearance. She is obese. She is not ill-appearing.  HENT:     Head: Normocephalic and atraumatic.     Right Ear: External ear normal.     Left Ear: External ear normal.     Nose: Nose normal.     Mouth/Throat:     Mouth: Mucous membranes are dry.  Eyes:     General: No scleral icterus.       Right eye: No discharge.        Left eye: No discharge.  Cardiovascular:     Rate and Rhythm: Regular rhythm. Tachycardia present.     Pulses: Normal pulses.     Heart sounds: Normal heart sounds.  Pulmonary:     Effort: Pulmonary effort is normal. No respiratory  distress.     Breath sounds: Normal breath sounds.  Abdominal:     General: Abdomen is flat.     Palpations: Abdomen is soft.     Tenderness: There is no abdominal tenderness. There is no guarding or rebound.  Musculoskeletal:        General: Normal range of motion.     Cervical back: Normal range of motion.     Right lower leg: No edema.     Left lower leg: No edema.  Skin:    General: Skin is warm and dry.     Capillary Refill: Capillary refill takes less than 2 seconds.  Neurological:     Mental Status: She is  alert and oriented to person, place, and time.     GCS: GCS eye subscore is 4. GCS verbal subscore is 5. GCS motor subscore is 6.  Psychiatric:        Mood and Affect: Mood normal.        Behavior: Behavior normal.     ED Results / Procedures / Treatments   Labs (all labs ordered are listed, but only abnormal results are displayed) Labs Reviewed  CBG MONITORING, ED - Abnormal; Notable for the following components:      Result Value   Glucose-Capillary 228 (*)    All other components within normal limits  BASIC METABOLIC PANEL  BASIC METABOLIC PANEL  BASIC METABOLIC PANEL  BASIC METABOLIC PANEL  BETA-HYDROXYBUTYRIC ACID  BETA-HYDROXYBUTYRIC ACID  CBC WITH DIFFERENTIAL/PLATELET  URINALYSIS, ROUTINE W REFLEX MICROSCOPIC  BLOOD GAS, VENOUS  BLOOD GAS, VENOUS  I-STAT BETA HCG BLOOD, ED ([facility], [facility], [facility] ONLY)  I-STAT BETA HCG BLOOD, ED ([facility], [facility], [facility] ONLY)    EKG None  Radiology No results found.  Procedures Procedures  {Document cardiac monitor, telemetry assessment procedure when appropriate:1}  Medications Ordered in ED Medications  lactated ringers bolus 20 mL/kg (has no administration in time range)    ED Course/ Medical Decision Making/ A&P Clinical Course as of 04/18/22 2353  Mon Apr 18, 2022  1947 Initial relief of n/v with medications, symptoms have returned. She has hx of THC abuse, EKG was reviewed, will trial droperidol  [SG]  2031 Pt with  increased WOB, tachypnea 30-40, hypoxic on RA, mild improvement with simple mask _0 , she denies any aspiration. HR elev as well. CT reviewed and was WNL. Will trial BIPAP given increased WOB, rpt CXR and vbg [SG]  2110 Respiratory status improved on BIPAP, pulse ox 99-100% with improved WOB.  [SG]  2138 CXR with interstitial edema, continue BIPAP, will add on bnp/trop [SG]  2201 Trop is elevated, she has some chest heaviness and was briefly hypoxic prior to starting BIPAP, her EKG shows sinus tachycardia. Will trend trop, could be demand ischemia 2/2 tachycardia/hypoxia. Give asa [SG]    Clinical Course User Index [SG] Jeanell Sparrow, DO                           Medical Decision Making Amount and/or Complexity of Data Reviewed Labs: ordered. Radiology: ordered.  Risk OTC drugs. Prescription drug management. Decision regarding hospitalization.   This patient presents to the ED with chief complaint(s) of n/v/diarrhea/elev glucose with pertinent past medical history of DM, thc, crohn dz which further complicates the presenting complaint. The complaint involves an extensive differential diagnosis and also carries with it a high risk of complications and morbidity.    The differential diagnosis includes but not limited to Differential diagnosis includes but is not exclusive to acute cholecystitis, intrathoracic causes for epigastric abdominal pain, gastritis, duodenitis, pancreatitis, small bowel or large bowel obstruction, abdominal aortic aneurysm, hernia, gastritis, etc.  Differential diagnosis includes but is not exclusive to ectopic pregnancy, ovarian cyst, ovarian torsion, acute appendicitis, urinary tract infection, endometriosis, bowel obstruction, hernia, colitis, renal colic, gastroenteritis, volvulus etc.  . Serious etiologies were considered.   The initial plan is to screening labs, IVF, anti-emetic   Additional history obtained: Additional history obtained from  family Records reviewed  home meds, prior labs/imaging  prior ed visits, primary care notes  Independent labs interpretation:  The following labs were independently interpreted:  Initial trop was 350's,  delta >700 VBG with 7.46 pH, CO2 35 BNP 136 CMP K 3.4, Cr Stable WBC 21.3, Hgb 16.3  Ua with ketones, doubt infection   Independent visualization of imaging: - I independently visualized the following imaging with scope of interpretation limited to determining acute life threatening conditions related to emergency care: CT PE, which revealed ***  Cardiac monitoring was reviewed and interpreted by myself which shows sinus tachy  Treatment and Reassessment: Reglan/benadryl IVF Anti-emetics Start heparin >. Symptoms improved  Consultation: - Consulted or discussed management/test interpretation w/ external professional:  Dr Terrence Dupont > d/w regarding pt, recommend d/w stemi doc to eval for cath lab  Spoke with Dr Claiborne Billings > feels pt does not need to be send emergently to the cath lab, recommends medical optimization of HF, respiratory status and cardiology eval; recommends starting nitro gtt  Consideration for admission or further workup: Admission was considered ***  Social Determinants of health: Counseled patient for approximately 3 minutes regarding smoking cessation. Discussed risks of smoking and how they applied and affected their visit here today. Patient not ready to quit at this time, however will follow up with their primary doctor when they are.   CPT code: [phone]: intermediate counseling for smoking cessation    Social History   Tobacco Use   Smoking status: Every Day    Packs/day: 0.50    Types: Cigarettes   Smokeless tobacco: Never  Vaping Use   Vaping Use: Former  Substance Use Topics   Alcohol use: No   Drug use: No      {Document critical care time when appropriate:1} {Document review of labs and clinical decision tools ie heart score, Chads2Vasc2 etc:1}   {Document your independent review of radiology images, and any outside records:1} {Document your discussion with family members, caretakers, and with consultants:1} {Document social determinants of health affecting pt's care:1} {Document your decision making why or why not admission, treatments were needed:1} Final Clinical Impression(s) / ED Diagnoses Final diagnoses:  None    Rx / DC Orders ED Discharge Orders     None

## 2022-04-18 NOTE — ED Triage Notes (Signed)
Per EMS- Patient c/o N/V and states that she has a history of DKA. CBG- 285 with EMS.

## 2022-04-18 NOTE — ED Provider Triage Note (Signed)
Emergency Medicine Provider Triage Evaluation Note  KARLEN BARBAR , a 46 y.o. female  was evaluated in triage.  Pt complains of vomiting, nausea since this AM. Has been taking insulin as prescribed. C/o diffuse abdominal pain. Tachycardic w/glucose of 280 for EMS. Reports bad thirst.   Review of Systems  Positive: Nausea, vomiting, abdominal pain Negative: Confusion, sob  Physical Exam  There were no vitals taken for this visit. Gen:   Awake, disheveled, ill appearing Resp:  Normal effort  MSK:   Moves extremities without difficulty   Medical Decision Making  Medically screening exam initiated at 2:50 PM.  Appropriate orders placed.  CAMREN HENTHORN was informed that the remainder of the evaluation will be completed by another provider, this initial triage assessment does not replace that evaluation, and the importance of remaining in the ED until their evaluation is complete.  Spoke with triage RN, concern for DKA, pt to not be put in waiting room, will need to be triage until bed comes available.    Pete Pelt, [place] 04/18/22 1452

## 2022-04-19 ENCOUNTER — Other Ambulatory Visit (HOSPITAL_COMMUNITY): Payer: Medicaid Other

## 2022-04-19 ENCOUNTER — Encounter (HOSPITAL_COMMUNITY)
Admission: EM | Disposition: A | Discharge status: Home / Self Care | Payer: Self-pay | Source: Home / Self Care | Attending: Cardiovascular Disease

## 2022-04-19 ENCOUNTER — Inpatient Hospital Stay (HOSPITAL_COMMUNITY): Payer: Medicaid Other

## 2022-04-19 DIAGNOSIS — E1165 Type 2 diabetes mellitus with hyperglycemia: Secondary | ICD-10-CM | POA: Diagnosis present

## 2022-04-19 DIAGNOSIS — K219 Gastro-esophageal reflux disease without esophagitis: Secondary | ICD-10-CM | POA: Diagnosis present

## 2022-04-19 DIAGNOSIS — I5021 Acute systolic (congestive) heart failure: Secondary | ICD-10-CM | POA: Diagnosis present

## 2022-04-19 DIAGNOSIS — Z8249 Family history of ischemic heart disease and other diseases of the circulatory system: Secondary | ICD-10-CM | POA: Diagnosis not present

## 2022-04-19 DIAGNOSIS — G4700 Insomnia, unspecified: Secondary | ICD-10-CM | POA: Diagnosis present

## 2022-04-19 DIAGNOSIS — Z7984 Long term (current) use of oral hypoglycemic drugs: Secondary | ICD-10-CM | POA: Diagnosis not present

## 2022-04-19 DIAGNOSIS — F32A Depression, unspecified: Secondary | ICD-10-CM | POA: Insufficient documentation

## 2022-04-19 DIAGNOSIS — I214 Non-ST elevation (NSTEMI) myocardial infarction: Secondary | ICD-10-CM | POA: Diagnosis present

## 2022-04-19 DIAGNOSIS — E669 Obesity, unspecified: Secondary | ICD-10-CM | POA: Diagnosis present

## 2022-04-19 DIAGNOSIS — I110 Hypertensive heart disease with heart failure: Secondary | ICD-10-CM | POA: Diagnosis present

## 2022-04-19 DIAGNOSIS — E860 Dehydration: Secondary | ICD-10-CM | POA: Diagnosis present

## 2022-04-19 DIAGNOSIS — I249 Acute ischemic heart disease, unspecified: Secondary | ICD-10-CM | POA: Diagnosis not present

## 2022-04-19 DIAGNOSIS — K5090 Crohn's disease, unspecified, without complications: Secondary | ICD-10-CM | POA: Insufficient documentation

## 2022-04-19 DIAGNOSIS — E785 Hyperlipidemia, unspecified: Secondary | ICD-10-CM | POA: Diagnosis present

## 2022-04-19 DIAGNOSIS — F17210 Nicotine dependence, cigarettes, uncomplicated: Secondary | ICD-10-CM | POA: Diagnosis present

## 2022-04-19 DIAGNOSIS — K3184 Gastroparesis: Secondary | ICD-10-CM | POA: Diagnosis present

## 2022-04-19 DIAGNOSIS — F419 Anxiety disorder, unspecified: Secondary | ICD-10-CM | POA: Diagnosis present

## 2022-04-19 DIAGNOSIS — Z20822 Contact with and (suspected) exposure to covid-19: Secondary | ICD-10-CM | POA: Diagnosis present

## 2022-04-19 DIAGNOSIS — R112 Nausea with vomiting, unspecified: Secondary | ICD-10-CM | POA: Diagnosis not present

## 2022-04-19 DIAGNOSIS — Z794 Long term (current) use of insulin: Secondary | ICD-10-CM | POA: Diagnosis not present

## 2022-04-19 DIAGNOSIS — Z9641 Presence of insulin pump (external) (internal): Secondary | ICD-10-CM | POA: Diagnosis present

## 2022-04-19 DIAGNOSIS — J9601 Acute respiratory failure with hypoxia: Secondary | ICD-10-CM | POA: Diagnosis present

## 2022-04-19 DIAGNOSIS — R809 Proteinuria, unspecified: Secondary | ICD-10-CM | POA: Diagnosis present

## 2022-04-19 DIAGNOSIS — E1143 Type 2 diabetes mellitus with diabetic autonomic (poly)neuropathy: Secondary | ICD-10-CM | POA: Diagnosis present

## 2022-04-19 DIAGNOSIS — Z79899 Other long term (current) drug therapy: Secondary | ICD-10-CM | POA: Diagnosis not present

## 2022-04-19 DIAGNOSIS — I2510 Atherosclerotic heart disease of native coronary artery without angina pectoris: Secondary | ICD-10-CM | POA: Diagnosis present

## 2022-04-19 DIAGNOSIS — G8929 Other chronic pain: Secondary | ICD-10-CM | POA: Insufficient documentation

## 2022-04-19 DIAGNOSIS — R570 Cardiogenic shock: Secondary | ICD-10-CM | POA: Diagnosis present

## 2022-04-19 DIAGNOSIS — G629 Polyneuropathy, unspecified: Secondary | ICD-10-CM | POA: Insufficient documentation

## 2022-04-19 LAB — ECHOCARDIOGRAM COMPLETE
AR max vel: 3.15 cm2
AV Area VTI: 3.46 cm2
AV Area mean vel: 2.9 cm2
AV Mean grad: 1 mmHg
AV Peak grad: 2.4 mmHg
Ao pk vel: 0.78 m/s
Area-P 1/2: 5.66 cm2
Height: 64 in
MV VTI: 2.78 cm2
S' Lateral: 4.5 cm
Weight: 3121.71 oz

## 2022-04-19 LAB — GLUCOSE, CAPILLARY
Glucose-Capillary: 402 mg/dL — ABNORMAL HIGH (ref 70–99)
Glucose-Capillary: 327 mg/dL — ABNORMAL HIGH (ref 70–99)
Glucose-Capillary: 255 mg/dL — ABNORMAL HIGH (ref 70–99)
Glucose-Capillary: 237 mg/dL — ABNORMAL HIGH (ref 70–99)
Glucose-Capillary: 230 mg/dL — ABNORMAL HIGH (ref 70–99)

## 2022-04-19 LAB — CBC
HCT: 47 % — ABNORMAL HIGH (ref 36.0–46.0)
Hemoglobin: 16 g/dL — ABNORMAL HIGH (ref 12.0–15.0)
MCH: 28.9 pg (ref 26.0–34.0)
MCHC: 34 g/dL (ref 30.0–36.0)
MCV: 85 fL (ref 80.0–100.0)
Platelets: 371 10*3/uL (ref 150–400)
RBC: 5.53 MIL/uL — ABNORMAL HIGH (ref 3.87–5.11)
RDW: 13.5 % (ref 11.5–15.5)
WBC: 26.2 10*3/uL — ABNORMAL HIGH (ref 4.0–10.5)
nRBC: 0 % (ref 0.0–0.2)

## 2022-04-19 LAB — BASIC METABOLIC PANEL
Anion gap: 19 — ABNORMAL HIGH (ref 5–15)
BUN: 10 mg/dL (ref 6–20)
CO2: 20 mmol/L — ABNORMAL LOW (ref 22–32)
Calcium: 8.9 mg/dL (ref 8.9–10.3)
Chloride: 97 mmol/L — ABNORMAL LOW (ref 98–111)
Creatinine, Ser: 0.72 mg/dL (ref 0.44–1.00)
GFR, Estimated: >60 mL/min (ref >60)
Glucose, Bld: 336 mg/dL — ABNORMAL HIGH (ref 70–99)
Potassium: 3.3 mmol/L — ABNORMAL LOW (ref 3.5–5.1)
Sodium: 136 mmol/L (ref 135–145)

## 2022-04-19 LAB — RAPID URINE DRUG SCREEN, HOSP PERFORMED
Amphetamines: NOT DETECTED
Barbiturates: NOT DETECTED
Benzodiazepines: NOT DETECTED
Cocaine: NOT DETECTED
Opiates: NOT DETECTED
Tetrahydrocannabinol: POSITIVE — AB

## 2022-04-19 LAB — LIPID PANEL
Cholesterol: 305 mg/dL — ABNORMAL HIGH (ref 0–200)
HDL: 42 mg/dL (ref >40)
LDL Cholesterol: 195 mg/dL — ABNORMAL HIGH (ref 0–99)
Total CHOL/HDL Ratio: 7.3 RATIO
Triglycerides: 342 mg/dL — ABNORMAL HIGH (ref <150)
VLDL: 68 mg/dL — ABNORMAL HIGH (ref 0–40)

## 2022-04-19 LAB — PROTIME-INR
INR: 1.1 (ref 0.8–1.2)
Prothrombin Time: 13.8 seconds (ref 11.4–15.2)

## 2022-04-19 LAB — MRSA NEXT GEN BY PCR, NASAL: MRSA by PCR Next Gen: DETECTED — AB

## 2022-04-19 LAB — HIV ANTIBODY (ROUTINE TESTING W REFLEX): HIV Screen 4th Generation wRfx: NONREACTIVE

## 2022-04-19 LAB — APTT: aPTT: 24 seconds (ref 24–36)

## 2022-04-19 LAB — OSMOLALITY: Osmolality: 303 mOsm/kg — ABNORMAL HIGH (ref 275–295)

## 2022-04-19 LAB — HEPARIN LEVEL (UNFRACTIONATED)
Heparin Unfractionated: <0.1 IU/mL — ABNORMAL LOW (ref 0.30–0.70)
Heparin Unfractionated: <0.1 IU/mL — ABNORMAL LOW (ref 0.30–0.70)

## 2022-04-19 SURGERY — LEFT HEART CATH AND CORONARY ANGIOGRAPHY
Anesthesia: LOCAL

## 2022-04-19 MED ORDER — ATORVASTATIN CALCIUM 80 MG PO TABS
80.0000 mg | ORAL_TABLET | Freq: Every day | ORAL | Status: DC
  Administered 2022-04-20 – 2022-04-21 (×2): 80 mg via ORAL
  Filled 2022-04-19 (×3): qty 1

## 2022-04-19 MED ORDER — PERFLUTREN LIPID MICROSPHERE
1.0000 mL | INTRAVENOUS | Status: AC | PRN
  Administered 2022-04-19: 6 mL via INTRAVENOUS

## 2022-04-19 MED ORDER — ACETAMINOPHEN 325 MG PO TABS
650.0000 mg | ORAL_TABLET | ORAL | Status: DC | PRN
  Filled 2022-04-19: qty 2

## 2022-04-19 MED ORDER — ASPIRIN 300 MG RE SUPP
300.0000 mg | RECTAL | Status: AC
  Filled 2022-04-19: qty 1

## 2022-04-19 MED ORDER — INSULIN ASPART 100 UNIT/ML IJ SOLN
6.0000 [IU] | Freq: Once | INTRAMUSCULAR | Status: AC
  Administered 2022-04-19: 6 [IU] via SUBCUTANEOUS

## 2022-04-19 MED ORDER — LORAZEPAM 1 MG PO TABS: 1.0000 mg | ORAL_TABLET | Freq: Two times a day (BID) | ORAL | Status: DC

## 2022-04-19 MED ORDER — MORPHINE SULFATE (PF) 2 MG/ML IV SOLN
1.0000 mg | INTRAVENOUS | Status: DC | PRN
  Administered 2022-04-19: 1 mg via INTRAVENOUS
  Filled 2022-04-19: qty 1

## 2022-04-19 MED ORDER — GABAPENTIN 300 MG PO CAPS: 600.0000 mg | ORAL_CAPSULE | Freq: Four times a day (QID) | ORAL | Status: DC

## 2022-04-19 MED ORDER — HEPARIN BOLUS VIA INFUSION
3000.0000 [IU] | Freq: Once | INTRAVENOUS | Status: AC
  Administered 2022-04-19: 3000 [IU] via INTRAVENOUS
  Filled 2022-04-19: qty 3000

## 2022-04-19 MED ORDER — ONDANSETRON HCL 4 MG/2ML IJ SOLN
4.0000 mg | Freq: Four times a day (QID) | INTRAMUSCULAR | Status: DC | PRN
  Administered 2022-04-19 – 2022-04-20 (×5): 4 mg via INTRAVENOUS
  Filled 2022-04-19 (×6): qty 2

## 2022-04-19 MED ORDER — ESCITALOPRAM OXALATE 10 MG PO TABS
20.0000 mg | ORAL_TABLET | Freq: Every day | ORAL | Status: DC
  Administered 2022-04-20 – 2022-04-22 (×3): 20 mg via ORAL
  Filled 2022-04-19 (×4): qty 2

## 2022-04-19 MED ORDER — ONDANSETRON HCL 4 MG/2ML IJ SOLN
INTRAMUSCULAR | Status: AC
  Filled 2022-04-19: qty 2

## 2022-04-19 MED ORDER — SODIUM CHLORIDE 0.9 % WEIGHT BASED INFUSION
3.0000 mL/kg/h | INTRAVENOUS | Status: DC
  Administered 2022-04-19: 3 mL/kg/h via INTRAVENOUS

## 2022-04-19 MED ORDER — METOPROLOL TARTRATE 12.5 MG HALF TABLET
12.5000 mg | ORAL_TABLET | Freq: Two times a day (BID) | ORAL | Status: DC
  Administered 2022-04-19 – 2022-04-20 (×2): 12.5 mg via ORAL
  Filled 2022-04-19 (×3): qty 1

## 2022-04-19 MED ORDER — ORAL CARE MOUTH RINSE: 15.0000 mL | OROMUCOSAL | Status: DC | PRN

## 2022-04-19 MED ORDER — INSULIN GLARGINE-YFGN 100 UNIT/ML ~~LOC~~ SOLN
10.0000 [IU] | Freq: Two times a day (BID) | SUBCUTANEOUS | Status: DC
  Administered 2022-04-19 (×2): 10 [IU] via SUBCUTANEOUS
  Filled 2022-04-19 (×4): qty 0.1

## 2022-04-19 MED ORDER — SODIUM CHLORIDE 0.9 % IV SOLN
500.0000 mg | INTRAVENOUS | Status: DC
  Administered 2022-04-19: 500 mg via INTRAVENOUS
  Filled 2022-04-19: qty 5

## 2022-04-19 MED ORDER — SODIUM CHLORIDE 0.9% FLUSH
3.0000 mL | Freq: Two times a day (BID) | INTRAVENOUS | Status: DC
  Administered 2022-04-19 – 2022-04-22 (×5): 3 mL via INTRAVENOUS

## 2022-04-19 MED ORDER — GABAPENTIN 300 MG PO CAPS
300.0000 mg | ORAL_CAPSULE | Freq: Four times a day (QID) | ORAL | Status: DC
  Administered 2022-04-20 – 2022-04-22 (×4): 300 mg via ORAL
  Filled 2022-04-19 (×8): qty 1

## 2022-04-19 MED ORDER — POTASSIUM CHLORIDE CRYS ER 20 MEQ PO TBCR
40.0000 meq | EXTENDED_RELEASE_TABLET | Freq: Once | ORAL | Status: AC
  Administered 2022-04-19: 40 meq via ORAL
  Filled 2022-04-19: qty 2

## 2022-04-19 MED ORDER — SODIUM CHLORIDE 0.9 % IV SOLN
12.5000 mg | Freq: Four times a day (QID) | INTRAVENOUS | Status: DC | PRN
  Administered 2022-04-19 – 2022-04-21 (×4): 12.5 mg via INTRAVENOUS
  Filled 2022-04-19: qty 0.5
  Filled 2022-04-19 (×2): qty 12.5
  Filled 2022-04-19: qty 0.5

## 2022-04-19 MED ORDER — INSULIN ASPART 100 UNIT/ML IJ SOLN
0.0000 [IU] | Freq: Three times a day (TID) | INTRAMUSCULAR | Status: DC
  Administered 2022-04-19: 7 [IU] via SUBCUTANEOUS
  Administered 2022-04-19: 11 [IU] via SUBCUTANEOUS
  Administered 2022-04-19: 15 [IU] via SUBCUTANEOUS
  Administered 2022-04-20: 4 [IU] via SUBCUTANEOUS
  Administered 2022-04-20: 7 [IU] via SUBCUTANEOUS
  Administered 2022-04-20: 4 [IU] via SUBCUTANEOUS
  Administered 2022-04-21: 7 [IU] via SUBCUTANEOUS
  Administered 2022-04-22: 3 [IU] via SUBCUTANEOUS

## 2022-04-19 MED ORDER — PROMETHAZINE HCL 25 MG PO TABS
25.0000 mg | ORAL_TABLET | Freq: Four times a day (QID) | ORAL | Status: DC | PRN
  Administered 2022-04-20: 25 mg via ORAL
  Filled 2022-04-19 (×2): qty 1

## 2022-04-19 MED ORDER — INSULIN ASPART 100 UNIT/ML IJ SOLN
4.0000 [IU] | Freq: Three times a day (TID) | INTRAMUSCULAR | Status: DC
  Administered 2022-04-20: 4 [IU] via SUBCUTANEOUS

## 2022-04-19 MED ORDER — NITROGLYCERIN 0.4 MG SL SUBL: 0.4000 mg | SUBLINGUAL_TABLET | SUBLINGUAL | Status: DC | PRN

## 2022-04-19 MED ORDER — ASPIRIN 81 MG PO CHEW
324.0000 mg | CHEWABLE_TABLET | ORAL | Status: AC
  Administered 2022-04-19: 324 mg via ORAL
  Filled 2022-04-19: qty 4

## 2022-04-19 MED ORDER — PANTOPRAZOLE SODIUM 40 MG IV SOLR
40.0000 mg | INTRAVENOUS | Status: DC
  Administered 2022-04-19 – 2022-04-20 (×2): 40 mg via INTRAVENOUS
  Filled 2022-04-19 (×2): qty 10

## 2022-04-19 MED ORDER — POTASSIUM CHLORIDE CRYS ER 20 MEQ PO TBCR
20.0000 meq | EXTENDED_RELEASE_TABLET | Freq: Two times a day (BID) | ORAL | Status: DC
  Administered 2022-04-19 – 2022-04-21 (×4): 20 meq via ORAL
  Filled 2022-04-19 (×5): qty 2
  Filled 2022-04-19: qty 1
  Filled 2022-04-19: qty 2
  Filled 2022-04-19: qty 1
  Filled 2022-04-19 (×4): qty 2
  Filled 2022-04-19: qty 1
  Filled 2022-04-19 (×2): qty 2

## 2022-04-19 MED ORDER — INSULIN ASPART 100 UNIT/ML IJ SOLN
INTRAMUSCULAR | Status: AC
  Filled 2022-04-19: qty 1

## 2022-04-19 MED ORDER — LABETALOL HCL 5 MG/ML IV SOLN
10.0000 mg | Freq: Once | INTRAVENOUS | Status: AC
  Administered 2022-04-19: 10 mg via INTRAVENOUS

## 2022-04-19 MED ORDER — SODIUM CHLORIDE 0.9% FLUSH: 3.0000 mL | INTRAVENOUS | Status: DC | PRN

## 2022-04-19 MED ORDER — LABETALOL HCL 5 MG/ML IV SOLN
INTRAVENOUS | Status: AC
  Filled 2022-04-19: qty 4

## 2022-04-19 MED ORDER — DAPAGLIFLOZIN PROPANEDIOL 10 MG PO TABS
10.0000 mg | ORAL_TABLET | Freq: Every day | ORAL | Status: DC
  Filled 2022-04-19: qty 1

## 2022-04-19 MED ORDER — MUPIROCIN 2 % EX OINT
1.0000 | TOPICAL_OINTMENT | Freq: Two times a day (BID) | CUTANEOUS | Status: DC
  Administered 2022-04-19 – 2022-04-22 (×6): 1 via NASAL
  Filled 2022-04-19: qty 22

## 2022-04-19 MED ORDER — ASPIRIN 81 MG PO TBEC
81.0000 mg | DELAYED_RELEASE_TABLET | Freq: Every day | ORAL | Status: DC
  Administered 2022-04-20 – 2022-04-22 (×3): 81 mg via ORAL
  Filled 2022-04-19 (×3): qty 1

## 2022-04-19 MED ORDER — SODIUM CHLORIDE 0.9% FLUSH
10.0000 mL | Freq: Two times a day (BID) | INTRAVENOUS | Status: DC
  Administered 2022-04-20 – 2022-04-22 (×5): 10 mL

## 2022-04-19 MED ORDER — SODIUM CHLORIDE 0.9 % IV SOLN: INTRAVENOUS | Status: DC

## 2022-04-19 MED ORDER — ZOLPIDEM TARTRATE 5 MG PO TABS
5.0000 mg | ORAL_TABLET | Freq: Every evening | ORAL | Status: DC | PRN
  Administered 2022-04-21: 5 mg via ORAL
  Filled 2022-04-19 (×2): qty 1

## 2022-04-19 MED ORDER — SODIUM CHLORIDE 0.9 % IV SOLN: 250.0000 mL | INTRAVENOUS | Status: DC | PRN

## 2022-04-19 MED ORDER — SODIUM CHLORIDE 0.9% FLUSH: 10.0000 mL | INTRAVENOUS | Status: DC | PRN

## 2022-04-19 MED ORDER — CHLORHEXIDINE GLUCONATE CLOTH 2 % EX PADS
6.0000 | MEDICATED_PAD | Freq: Every day | CUTANEOUS | Status: DC
  Administered 2022-04-20 – 2022-04-21 (×2): 6 via TOPICAL

## 2022-04-19 MED ORDER — CHLORHEXIDINE GLUCONATE CLOTH 2 % EX PADS: 6.0000 | MEDICATED_PAD | Freq: Every day | CUTANEOUS | Status: DC

## 2022-04-19 MED ORDER — SODIUM CHLORIDE 0.9 % WEIGHT BASED INFUSION
1.0000 mL/kg/h | INTRAVENOUS | Status: DC
  Administered 2022-04-19: 1 mL/kg/h via INTRAVENOUS

## 2022-04-19 MED ORDER — ROSUVASTATIN CALCIUM 20 MG PO TABS: 20.0000 mg | ORAL_TABLET | Freq: Every day | ORAL | Status: DC

## 2022-04-19 NOTE — Progress Notes (Signed)
Dr Algie Coffer at bedside, new orders obtained, See [facility], See flowsheets, safety maintained

## 2022-04-19 NOTE — Progress Notes (Signed)
Ref: [facility]   Subjective:  Sick on stomach with nausea and vomiting.  On IV fluids. Sliding scale glucose control entered.  Zofran for nausea/vomiting. CT abdomen was unremarkable.  Objective:  Vital Signs in the last 24 hours: Temp:  [97.9 F (36.6 C)-99.5 F (37.5 C)] 98.7 F (37.1 C) (11/14 0454) Pulse Rate:  [107-155] 129 (11/14 0720) Resp:  [16-37] 16 (11/14 0720) BP: (115-168)/(70-124) 126/98 (11/14 0720) SpO2:  [88 %-100 %] 92 % (11/14 0720) Weight:  [90.7 kg] 90.7 kg (11/13 1512)  Physical Exam: BP Readings from Last 1 Encounters:  04/19/22 (!) 126/98     Wt Readings from Last 1 Encounters:  04/18/22 90.7 kg    Weight change:  Body mass index is 34.33 kg/m. HEENT: [place]/AT, Eyes-Brown, PERL, EOMI, Conjunctiva-Pink, Sclera-Non-icteric Neck: No JVD, No bruit, Trachea midline. Lungs:  Clearing, Bilateral. Cardiac: Tachycardic, Regular rhythm, normal S1 and S2, no S3. II/VI systolic murmur. Abdomen:  Soft, non-tender. BS present. Extremities:  No edema present. No cyanosis. No clubbing. CNS: AxOx3, Cranial nerves grossly intact, moves all 4 extremities.  Skin: Warm and dry.   Intake/Output from previous day: 11/13 0701 - 11/14 0700 In: 2050 [IV Piggyback:2050] Out: -     Lab Results: BMET    Component Value Date/Time   NA 136 04/19/2022 0449   NA 140 04/18/2022 1535   NA 141 02/22/2021 0827   K 3.3 (L) 04/19/2022 0449   K 3.4 (L) 04/18/2022 1535   K 3.8 02/22/2021 0827   CL 97 (L) 04/19/2022 0449   CL 104 04/18/2022 1535   CL 103 02/22/2021 0827   CO2 20 (L) 04/19/2022 0449   CO2 22 04/18/2022 1535   CO2 25 02/22/2021 0827   GLUCOSE 336 (H) 04/19/2022 0449   GLUCOSE 257 (H) 04/18/2022 1535   GLUCOSE 217 (H) 02/22/2021 0827   BUN 10 04/19/2022 0449   BUN 10 04/18/2022 1535   BUN 13 02/22/2021 0827   CREATININE 0.72 04/19/2022 0449   CREATININE 0.61 04/18/2022 1535   CREATININE 0.50 02/22/2021 0827   CALCIUM 8.9 04/19/2022  0449   CALCIUM 9.3 04/18/2022 1535   CALCIUM 9.1 02/22/2021 0827   GFRNONAA >60 04/19/2022 0449   GFRNONAA >60 04/18/2022 1535   GFRNONAA >60 02/22/2021 0827   GFRAA >60 11/16/2016 0818   GFRAA >60 01/02/2016 1354   GFRAA  08/04/2010 0345    >60        The eGFR has been calculated using the MDRD equation. This calculation has not been validated in all clinical situations. eGFR's persistently <60 mL/min signify possible Chronic Kidney Disease.   CBC    Component Value Date/Time   WBC 26.2 (H) 04/19/2022 0449   RBC 5.53 (H) 04/19/2022 0449   HGB 16.0 (H) 04/19/2022 0449   HCT 47.0 (H) 04/19/2022 0449   PLT 371 04/19/2022 0449   MCV 85.0 04/19/2022 0449   MCH 28.9 04/19/2022 0449   MCHC 34.0 04/19/2022 0449   RDW 13.5 04/19/2022 0449   LYMPHSABS 1.4 04/18/2022 1453   MONOABS 1.0 04/18/2022 1453   EOSABS 0.0 04/18/2022 1453   BASOSABS 0.1 04/18/2022 1453   HEPATIC Function Panel Recent Labs    04/18/22 1535  PROT 8.1  ALBUMIN 4.3  AST 21  ALT 20  ALKPHOS 74   HEMOGLOBIN A1C Lab Results  Component Value Date   MPG 180 (H) 08/05/2010   CARDIAC ENZYMES No results found for: "CKTOTAL", "CKMB", "CKMBINDEX", "TROPONINI" BNP No results  for input(s): "PROBNP" in the last 8760 hours. TSH No results for input(s): "TSH" in the last 8760 hours. CHOLESTEROL Recent Labs    04/19/22 0449  CHOL 305*    Scheduled Meds:  [MAR Hold] aspirin EC  81 mg Oral Daily   insulin aspart  0-20 Units Subcutaneous TID WC   insulin aspart  4 Units Subcutaneous TID WC   [MAR Hold] metoprolol tartrate  12.5 mg Oral BID   [MAR Hold] potassium chloride  20 mEq Oral BID   [MAR Hold] rosuvastatin  20 mg Oral Daily   [MAR Hold] sodium chloride flush  3 mL Intravenous Q12H   Continuous Infusions:  [MAR Hold] sodium chloride     sodium chloride 1 mL/kg/hr (04/19/22 0445)   heparin 900 Units/hr (04/19/22 0021)   nitroGLYCERIN 5 mcg/min (04/19/22 0044)   PRN Meds:.[MAR Hold] sodium  chloride, [MAR Hold] acetaminophen, [MAR Hold] nitroGLYCERIN, [MAR Hold] ondansetron (ZOFRAN) IV, [MAR Hold] sodium chloride flush  Assessment/Plan: Acute coronary syndrome Acute pulmonary edema Type 2 Dm with hyperglycemia Obesity Tobacco use disorder Atypical chest pain Abdominal pain/nausea  Plan: IV fluids. Glycemic control. Postpone cardiac cath till nausea is controlled.   LOS: 0 days   Time spent including chart review, lab review, examination, discussion with patient/Nurse : 30 min   Dixie Dials  MD  04/19/2022, 7:34 AM

## 2022-04-19 NOTE — Progress Notes (Signed)
Pt resting easy with eyes closed, no signs of distress or pain, safety maintained, Maryann Conners, RN  located pt's family in [facility] waiting room to update and retrieve pt's cell phone

## 2022-04-19 NOTE — Progress Notes (Signed)
*  PRELIMINARY RESULTS* Echocardiogram 2D Echocardiogram has been performed.  Carolyne Fiscal 04/19/2022, 4:37 PM

## 2022-04-19 NOTE — Progress Notes (Addendum)
Pt brought to cath lab holding, bay 6, connected to monitor, pt nauseated, +emesis noted, yellowish-brownish in color, moderate in amount, (Pt with emesis bag), See MAR and flowsheets, This RN did not receive report from [facility] or [facility], per cath lab front desk, no report received, [facility], Marchelle Folks, stated she did not receive report or a packet, their printer was "backed up", This RN signed that she received pt, but no report, Heparin gtt infusing at 900 units/hour into LAC and NTG gtt infusing at 31mcg/min into RAC, see documentation for PIV, safety maintained

## 2022-04-19 NOTE — Progress Notes (Signed)
Dr. Algie Coffer notified by Maryann Conners, RN regarding pt's increased BSL, N/V, and VS, coming to see pt

## 2022-04-19 NOTE — Progress Notes (Signed)
ANTICOAGULATION CONSULT NOTE - Initial Consult  Pharmacy Consult for heparin Indication: chest pain/ACS  Allergies  Allergen Reactions   Ceclor [Cefaclor] Hives    Patient Measurements: Height: 5\' 4"  (162.6 cm) Weight: 88.5 kg (195 lb 1.7 oz) IBW/kg (Calculated) : 54.7 Heparin Dosing Weight: 75kg  Vital Signs: Temp: 98.6 F (37 C) (11/14 1105) Temp Source: Temporal (11/14 0930) BP: 125/100 (11/14 0925) Pulse Rate: 119 (11/14 0930)  Labs: Recent Labs    04/18/22 1453 04/18/22 1527 04/18/22 1535 04/18/22 2225 04/19/22 0031 04/19/22 0449 04/19/22 1350  HGB 16.3*  --   --   --   --  16.0*  --   HCT 47.7*  --   --   --   --  47.0*  --   PLT 327  --   --   --   --  371  --   APTT  --   --   --   --  24  --   --   LABPROT  --   --   --   --  13.8  --   --   INR  --   --   --   --  1.1  --   --   HEPARINUNFRC  --   --   --   --   --   --  <0.10*  CREATININE  --   --  0.61  --   --  0.72  --   TROPONINIHS  --  344*  --  713*  --   --   --      Estimated Creatinine Clearance: 95.6 mL/min (by C-G formula based on SCr of 0.72 mg/dL).   Medical History: Past Medical History:  Diagnosis Date   Anxiety    Crohn's disease (H[facility])    Diabetes mellitus    DKA (diabetic ketoacidosis) ([facility])    Hypertension    Insomnia    Neuropathy     Assessment: 46yo female with medical history of insulin-dependent DM, crohn disease, anxiety, daily cannabis use presents to the ED with complaint of abd pain, N/V, diarrhea.  Pharmacy consulted to dose heparin drip for ACS/STEMI.  No prior AC noted  Hgb 16.3, Plts 327, Trop 713  Cath canceled today due to N/V. HL came back undetectable. We will re-bolus and increase rate.   Goal of Therapy:  Heparin level 0.3-0.7 units/ml Monitor platelets by anticoagulation protocol: Yes   Plan:   Heparin bolus 3000 units x 1 Increase heparin drip 1150 units/hr Heparin level in 6 hours Daily CBC   46yo, PharmD, BCIDP, AAHIVP,  CPP Infectious Disease Pharmacist 04/19/2022 4:22 PM

## 2022-04-19 NOTE — Progress Notes (Signed)

## 2022-04-19 NOTE — H&P (Signed)
Referring Physician: Thereasa Solo, DO  [name] is an 46 y.o. female.                       Chief Complaint: Nausea, vomiting and abdominal pain  HPI: 46 years old female with PMH of type 2 DM, HTN,obesity, tobacco use disorder and Crohn's disease has one day h/o nausea, vomiting and abdominal pain with shortness of breath. Her CT chest and abdomen was positive for pulmonary edema and negative for PE. EKG is sinus tachycardia. Her BNP is mildly elevated but her troponin I levels are showing upward trend with levels of 344 and 713 ng. Lipase was normal.   Past Medical History:  Diagnosis Date   Anxiety    Crohn's disease (HCC)    Diabetes mellitus    DKA (diabetic ketoacidosis) (HCC)    Hypertension    Insomnia    Neuropathy       Past Surgical History:  Procedure Laterality Date   RECTAL SURGERY      Family History  Problem Relation Age of Onset   Heart disease Father    Crohn's disease Father    Colon cancer Neg Hx    Rectal cancer Neg Hx    Esophageal cancer Neg Hx    Social History:  reports that she has been smoking cigarettes. She has been smoking an average of .5 packs per day. She has never used smokeless tobacco. She reports that she does not drink alcohol and does not use drugs.  Allergies:  Allergies  Allergen Reactions   Ceclor [Cefaclor] Hives    (Not in a hospital admission)   Results for orders placed or performed during the hospital encounter of 04/18/22 (from the past 48 hour(s))  Beta-hydroxybutyric acid     Status: Abnormal   Collection Time: 04/18/22  2:53 PM  Result Value Ref Range   Beta-Hydroxybutyric Acid 1.06 (H) 0.05 - 0.27 mmol/L    Comment: Performed at [facility], [address], [place], [place] [address]  CBC with Differential (PNL)     Status: Abnormal   Collection Time: 04/18/22  2:53 PM  Result Value Ref Range   WBC 21.3 (H) 4.0 - 10.5 K/uL   RBC 5.59 (H) 3.87 - 5.11 MIL/uL   Hemoglobin 16.3 (H) 12.0 -  15.0 g/dL   HCT 60.4 (H) 54.0 - 98.1 %   MCV 85.3 80.0 - 100.0 fL   MCH 29.2 26.0 - 34.0 pg   MCHC 34.2 30.0 - 36.0 g/dL   RDW 19.1 47.8 - 29.5 %   Platelets 327 150 - 400 K/uL   nRBC 0.0 0.0 - 0.2 %   Neutrophils Relative % 88 %   Neutro Abs 18.8 (H) 1.7 - 7.7 K/uL   Lymphocytes Relative 6 %   Lymphs Abs 1.4 0.7 - 4.0 K/uL   Monocytes Relative 5 %   Monocytes Absolute 1.0 0.1 - 1.0 K/uL   Eosinophils Relative 0 %   Eosinophils Absolute 0.0 0.0 - 0.5 K/uL   Basophils Relative 0 %   Basophils Absolute 0.1 0.0 - 0.1 K/uL   Immature Granulocytes 1 %   Abs Immature Granulocytes 0.11 (H) 0.00 - 0.07 K/uL    Comment: Performed at [facility], [address], [place], [place] [address]  Blood gas, venous (at [facility], not at [facility])     Status: Abnormal   Collection Time: 04/18/22  2:55 PM  Result Value Ref  Range   pH, Ven 7.46 (H) 7.25 - 7.43   pCO2, Ven 37 (L) 44 - 60 mmHg   pO2, Ven 33 32 - 45 mmHg   Bicarbonate 26.3 20.0 - 28.0 mmol/L   Acid-Base Excess 2.5 (H) 0.0 - 2.0 mmol/L   O2 Saturation 60.7 %   Patient temperature 37.0     Comment: Performed at [facility], [address], [place], [place] [address]  CBG monitoring, ED     Status: Abnormal   Collection Time: 04/18/22  3:04 PM  Result Value Ref Range   Glucose-Capillary 228 (H) 70 - 99 mg/dL    Comment: Glucose reference range applies only to samples taken after fasting for at least 8 hours.  Troponin I (High Sensitivity)     Status: Abnormal   Collection Time: 04/18/22  3:27 PM  Result Value Ref Range   Troponin I (High Sensitivity) 344 (HH) <18 ng/L    Comment: CRITICAL RESULT CALLED TO, READ BACK BY AND VERIFIED WITH WOODY,A RN @ [phone] ON [id number] BY MAHMOUD,S (NOTE) Elevated high sensitivity troponin I (hsTnI) values and significant  changes across serial measurements may suggest ACS but many other  chronic and acute conditions are known to elevate hsTnI results.  Refer to the  "Links" section for chest pain algorithms and additional  guidance. Performed at [facility], [address] [place], [place] [address]   Lipase, blood     Status: None   Collection Time: 04/18/22  3:35 PM  Result Value Ref Range   Lipase 39 11 - 51 U/L    Comment: Performed at [facility], [address], [place], [place] [address]  Comprehensive metabolic panel     Status: Abnormal   Collection Time: 04/18/22  3:35 PM  Result Value Ref Range   Sodium 140 135 - 145 mmol/L   Potassium 3.4 (L) 3.5 - 5.1 mmol/L   Chloride 104 98 - 111 mmol/L   CO2 22 22 - 32 mmol/L   Glucose, Bld 257 (H) 70 - 99 mg/dL    Comment: Glucose reference range applies only to samples taken after fasting for at least 8 hours.   BUN 10 6 - 20 mg/dL   Creatinine, Ser 5.78 0.44 - 1.00 mg/dL   Calcium 9.3 8.9 - 46.9 mg/dL   Total Protein 8.1 6.5 - 8.1 g/dL   Albumin 4.3 3.5 - 5.0 g/dL   AST 21 15 - 41 U/L   ALT 20 0 - 44 U/L   Alkaline Phosphatase 74 38 - 126 U/L   Total Bilirubin 0.9 0.3 - 1.2 mg/dL   GFR, Estimated >62 >95 mL/min    Comment: (NOTE) Calculated using the CKD-EPI Creatinine Equation (2021)    Anion gap 14 5 - 15    Comment: Performed at [facility], [address], [place], [place] [address]  hCG, quantitative, pregnancy     Status: None   Collection Time: 04/18/22  3:35 PM  Result Value Ref Range   hCG, Beta Chain, Quant, S <1 <5 mIU/mL    Comment:          GEST. AGE      CONC.  (mIU/mL)   <=1 WEEK        5 - 50     2 WEEKS       50 - 500     3 WEEKS       100 - 10,000  4 WEEKS     1,000 - 30,000     5 WEEKS     3,500 - 115,000   6-8 WEEKS     12,000 - 270,000    12 WEEKS     15,000 - 220,000        FEMALE AND NON-PREGNANT FEMALE:     LESS THAN 5 mIU/mL Performed at [facility], [address], [place], [place] [address]   Resp Panel by RT-PCR (Flu A&B, Covid) Anterior Nasal Swab     Status: None    Collection Time: 04/18/22  3:36 PM   Specimen: Anterior Nasal Swab  Result Value Ref Range   SARS Coronavirus 2 by RT PCR NEGATIVE NEGATIVE    Comment: (NOTE) SARS-CoV-2 target nucleic acids are NOT DETECTED.  The SARS-CoV-2 RNA is generally detectable in upper respiratory specimens during the acute phase of infection. The lowest concentration of SARS-CoV-2 viral copies this assay can detect is 138 copies/mL. A negative result does not preclude SARS-Cov-2 infection and should not be used as the sole basis for treatment or other patient management decisions. A negative result may occur with  improper specimen collection/handling, submission of specimen other than nasopharyngeal swab, presence of viral mutation(s) within the areas targeted by this assay, and inadequate number of viral copies(<138 copies/mL). A negative result must be combined with clinical observations, patient history, and epidemiological information. The expected result is Negative.  Fact Sheet for Patients:  BloggerCourse.com  Fact Sheet for Healthcare Providers:  SeriousBroker.it  This test is no t yet approved or cleared by the [place] FDA and  has been authorized for detection and/or diagnosis of SARS-CoV-2 by FDA under an Emergency Use Authorization (EUA). This EUA will remain  in effect (meaning this test can be used) for the duration of the COVID-19 declaration under Section 564(b)(1) of the Act, 21 U.S.C.section 360bbb-3(b)(1), unless the authorization is terminated  or revoked sooner.       Influenza A by PCR NEGATIVE NEGATIVE   Influenza B by PCR NEGATIVE NEGATIVE    Comment: (NOTE) The Xpert Xpress SARS-CoV-2/FLU/RSV plus assay is intended as an aid in the diagnosis of influenza from Nasopharyngeal swab specimens and should not be used as a sole basis for treatment. Nasal washings and aspirates are unacceptable for Xpert Xpress  SARS-CoV-2/FLU/RSV testing.  Fact Sheet for Patients: BloggerCourse.com  Fact Sheet for Healthcare Providers: SeriousBroker.it  This test is not yet approved or cleared by the [place] FDA and has been authorized for detection and/or diagnosis of SARS-CoV-2 by FDA under an Emergency Use Authorization (EUA). This EUA will remain in effect (meaning this test can be used) for the duration of the COVID-19 declaration under Section 564(b)(1) of the Act, 21 U.S.C. section 360bbb-3(b)(1), unless the authorization is terminated or revoked.  Performed at [facility], [address], [place], [place] [address]   Brain natriuretic peptide     Status: Abnormal   Collection Time: 04/18/22  3:37 PM  Result Value Ref Range   B Natriuretic Peptide 136.4 (H) 0.0 - 100.0 pg/mL    Comment: Performed at [facility], [address] [place], [place] [address]  Beta-hydroxybutyric acid     Status: Abnormal   Collection Time: 04/18/22  4:14 PM  Result Value Ref Range   Beta-Hydroxybutyric Acid 1.03 (H) 0.05 - 0.27 mmol/L    Comment: Performed at [facility], [address] [place], [place] [address]  Blood gas,  venous     Status: Abnormal   Collection Time: 04/18/22  9:00 PM  Result Value Ref Range   pH, Ven 7.46 (H) 7.25 - 7.43   pCO2, Ven 35 (L) 44 - 60 mmHg   pO2, Ven 55 (H) 32 - 45 mmHg   Bicarbonate 24.9 20.0 - 28.0 mmol/L   Acid-Base Excess 1.4 0.0 - 2.0 mmol/L   O2 Saturation 90.5 %   Patient temperature 37.0     Comment: Performed at [facility], [address] [place], [place] [address]  Urinalysis, Routine w reflex microscopic     Status: Abnormal   Collection Time: 04/18/22  9:09 PM  Result Value Ref Range   Color, Urine YELLOW YELLOW   APPearance CLEAR CLEAR   Specific Gravity, Urine >1.046 (H) 1.005 - 1.030   pH 5.0 5.0 - 8.0   Glucose, UA >=500  (A) NEGATIVE mg/dL   Hgb urine dipstick SMALL (A) NEGATIVE   Bilirubin Urine NEGATIVE NEGATIVE   Ketones, ur 80 (A) NEGATIVE mg/dL   Protein, ur >=654 (A) NEGATIVE mg/dL   Nitrite NEGATIVE NEGATIVE   Leukocytes,Ua NEGATIVE NEGATIVE   RBC / HPF 6-10 0 - 5 RBC/hpf   WBC, UA 6-10 0 - 5 WBC/hpf   Bacteria, UA RARE (A) NONE SEEN   Squamous Epithelial / LPF 0-5 0 - 5   Mucus PRESENT     Comment: Performed at [facility], [address], [place], [place] [address]  Troponin I (High Sensitivity)     Status: Abnormal   Collection Time: 04/18/22 10:25 PM  Result Value Ref Range   Troponin I (High Sensitivity) 713 (HH) <18 ng/L    Comment: CRITICAL VALUE NOTED. VALUE IS CONSISTENT WITH PREVIOUSLY REPORTED/CALLED VALUE DELTA CHECK NOTED (NOTE) Elevated high sensitivity troponin I (hsTnI) values and significant  changes across serial measurements may suggest ACS but many other  chronic and acute conditions are known to elevate hsTnI results.  Refer to the "Links" section for chest pain algorithms and additional  guidance. Performed at [facility], [address] [place], [place] [address]   Rapid urine drug screen (hospital performed)     Status: Abnormal   Collection Time: 04/18/22 11:51 PM  Result Value Ref Range   Opiates NONE DETECTED NONE DETECTED   Cocaine NONE DETECTED NONE DETECTED   Benzodiazepines NONE DETECTED NONE DETECTED   Amphetamines NONE DETECTED NONE DETECTED   Tetrahydrocannabinol POSITIVE (A) NONE DETECTED   Barbiturates NONE DETECTED NONE DETECTED    Comment: (NOTE) DRUG SCREEN FOR MEDICAL PURPOSES ONLY.  IF CONFIRMATION IS NEEDED FOR ANY PURPOSE, NOTIFY LAB WITHIN 5 DAYS.  LOWEST DETECTABLE LIMITS FOR URINE DRUG SCREEN Drug Class                     Cutoff (ng/mL) Amphetamine and metabolites    1000 Barbiturate and metabolites    200 Benzodiazepine                 200 Opiates and metabolites        300 Cocaine and  metabolites        300 THC                            50 Performed at [facility], [address] [place], [place] [address]    DG Chest Portable 1 View  Result Date: 04/18/2022 CLINICAL DATA:  Dyspnea EXAM: PORTABLE CHEST 1 VIEW COMPARISON:  None Available. FINDINGS: The lungs are symmetrically well inflated. Mild to moderate interstitial pulmonary edema. No pneumothorax or pleural effusion. Cardiac size within normal limits. No acute bone abnormality. IMPRESSION: 1. Mild to moderate interstitial pulmonary edema. Electronically Signed   By: Helyn Numbers M.D.   On: 04/18/2022 21:06   CT Angio Chest PE W and/or Wo Contrast  Result Date: 04/18/2022 CLINICAL DATA:  Pulmonary embolism (PE) suspected, high prob; Abdominal pain, acute, nonlocalized. Nausea, vomiting, tachycardia, hyperglycemia. EXAM: CT ANGIOGRAPHY CHEST CT ABDOMEN AND PELVIS WITH CONTRAST TECHNIQUE: Multidetector CT imaging of the chest was performed using the standard protocol during bolus administration of intravenous contrast. Multiplanar CT image reconstructions and MIPs were obtained to evaluate the vascular anatomy. Multidetector CT imaging of the abdomen and pelvis was performed using the standard protocol during bolus administration of intravenous contrast. RADIATION DOSE REDUCTION: This exam was performed according to the departmental dose-optimization program which includes automated exposure control, adjustment of the mA and/or kV according to patient size and/or use of iterative reconstruction technique. CONTRAST:  80mL OMNIPAQUE IOHEXOL 350 MG/ML SOLN COMPARISON:  None Available. FINDINGS: CTA CHEST FINDINGS Cardiovascular: Adequate opacification of the pulmonary arterial tree. No intraluminal filling defect identified to suggest acute pulmonary embolism. Central pulmonary arteries are of normal caliber. No significant coronary artery calcification. Cardiac size within normal limits. No pericardial  effusion. No significant atherosclerotic calcification within the thoracic aorta. No aortic aneurysm. Mediastinum/Nodes: No enlarged mediastinal, hilar, or axillary lymph nodes. Thyroid gland, trachea, and esophagus demonstrate no significant findings. Lungs/Pleura: There is diffuse smooth interlobular septal thickening in keeping with mild interstitial pulmonary edema. No confluent pulmonary infiltrate. No pneumothorax or pleural effusion. No central obstructing lesion. Musculoskeletal: No chest wall abnormality. No acute or significant osseous findings. Review of the MIP images confirms the above findings. CT ABDOMEN and PELVIS FINDINGS Hepatobiliary: No focal liver abnormality is seen. No gallstones, gallbladder wall thickening, or biliary dilatation. Pancreas: Unremarkable Spleen: Unremarkable Adrenals/Urinary Tract: Adrenal glands are unremarkable. Kidneys are normal, without renal calculi, focal lesion, or hydronephrosis. Bladder is unremarkable. Stomach/Bowel: Stomach is within normal limits. Appendix appears normal. No evidence of bowel wall thickening, distention, or inflammatory changes. Vascular/Lymphatic: Aortic atherosclerosis. No enlarged abdominal or pelvic lymph nodes. Reproductive: Uterus and bilateral adnexa are unremarkable. Other: Small left parasagittal fat containing supraumbilical hernia. No abdominopelvic ascites. Musculoskeletal: No acute bone abnormality. Degenerative changes seen within the lumbar spine. Review of the MIP images confirms the above findings. IMPRESSION: 1. No pulmonary embolism. 2. Mild interstitial pulmonary edema. 3. No acute intra-abdominal pathology identified. Electronically Signed   By: Helyn Numbers M.D.   On: 04/18/2022 20:07   CT ABDOMEN PELVIS W CONTRAST  Result Date: 04/18/2022 CLINICAL DATA:  Pulmonary embolism (PE) suspected, high prob; Abdominal pain, acute, nonlocalized. Nausea, vomiting, tachycardia, hyperglycemia. EXAM: CT ANGIOGRAPHY CHEST CT ABDOMEN  AND PELVIS WITH CONTRAST TECHNIQUE: Multidetector CT imaging of the chest was performed using the standard protocol during bolus administration of intravenous contrast. Multiplanar CT image reconstructions and MIPs were obtained to evaluate the vascular anatomy. Multidetector CT imaging of the abdomen and pelvis was performed using the standard protocol during bolus administration of intravenous contrast. RADIATION DOSE REDUCTION: This exam was performed according to the departmental dose-optimization program which includes automated exposure control, adjustment of the mA and/or kV according to patient size and/or use of iterative reconstruction technique. CONTRAST:  80mL OMNIPAQUE IOHEXOL 350 MG/ML SOLN COMPARISON:  None Available. FINDINGS: CTA CHEST FINDINGS  Cardiovascular: Adequate opacification of the pulmonary arterial tree. No intraluminal filling defect identified to suggest acute pulmonary embolism. Central pulmonary arteries are of normal caliber. No significant coronary artery calcification. Cardiac size within normal limits. No pericardial effusion. No significant atherosclerotic calcification within the thoracic aorta. No aortic aneurysm. Mediastinum/Nodes: No enlarged mediastinal, hilar, or axillary lymph nodes. Thyroid gland, trachea, and esophagus demonstrate no significant findings. Lungs/Pleura: There is diffuse smooth interlobular septal thickening in keeping with mild interstitial pulmonary edema. No confluent pulmonary infiltrate. No pneumothorax or pleural effusion. No central obstructing lesion. Musculoskeletal: No chest wall abnormality. No acute or significant osseous findings. Review of the MIP images confirms the above findings. CT ABDOMEN and PELVIS FINDINGS Hepatobiliary: No focal liver abnormality is seen. No gallstones, gallbladder wall thickening, or biliary dilatation. Pancreas: Unremarkable Spleen: Unremarkable Adrenals/Urinary Tract: Adrenal glands are unremarkable. Kidneys are  normal, without renal calculi, focal lesion, or hydronephrosis. Bladder is unremarkable. Stomach/Bowel: Stomach is within normal limits. Appendix appears normal. No evidence of bowel wall thickening, distention, or inflammatory changes. Vascular/Lymphatic: Aortic atherosclerosis. No enlarged abdominal or pelvic lymph nodes. Reproductive: Uterus and bilateral adnexa are unremarkable. Other: Small left parasagittal fat containing supraumbilical hernia. No abdominopelvic ascites. Musculoskeletal: No acute bone abnormality. Degenerative changes seen within the lumbar spine. Review of the MIP images confirms the above findings. IMPRESSION: 1. No pulmonary embolism. 2. Mild interstitial pulmonary edema. 3. No acute intra-abdominal pathology identified. Electronically Signed   By: Helyn Numbers M.D.   On: 04/18/2022 20:07    Review Of Systems Constitutional: No fever, chills, Chronic weight gain. Eyes: No vision change, wears glasses. No discharge or pain. Ears: No hearing loss, No tinnitus. Respiratory: No asthma, COPD, pneumonias. Positive shortness of breath. No hemoptysis. Cardiovascular: Positive chest pain, palpitation, no leg edema. Gastrointestinal: Positive nausea, vomiting, diarrhea, constipation. No GI bleed. No hepatitis. Genitourinary: No dysuria, hematuria, kidney stone. No incontinance. Neurological: No headache, stroke, seizures.  Psychiatry: No psych facility admission for anxiety, depression, suicide. No detox. Skin: No rash. Musculoskeletal: No joint pain, fibromyalgia. No neck pain, back pain. Lymphadenopathy: No lymphadenopathy. Hematology: No anemia or easy bruising.   Blood pressure (!) 147/111, pulse (!) 140, temperature 99.5 F (37.5 C), temperature source Oral, resp. rate (!) 31, height  (1.626 m), weight 90.7 kg, last menstrual period 08/03/2019, SpO2 92 %, unknown if currently breastfeeding. Body mass index is 34.33 kg/m. General appearance: alert, cooperative, appears  stated age and in moderate respiratory distress Head: Normocephalic, atraumatic. Eyes: Brown eyes, pink conjunctiva, corneas clear. PERRL, EOM's intact. Neck: No adenopathy, no carotid bruit, no JVD, supple, symmetrical, trachea midline and thyroid not enlarged. Resp: Clearing to auscultation bilaterally. Cardio: Tachycardic, Regular rate and rhythm, S1, S2 normal, II/VI systolic murmur, no click, rub or gallop GI: Soft, non-tender; bowel sounds normal; no organomegaly. Extremities: No edema, cyanosis or clubbing. Skin: Warm and dry.  Neurologic: Alert and oriented X 3, normal strength. Normal coordination.  Assessment/Plan Acute coronary syndrome Acute pulmonary edema Type 2 DM with hyperglycemia Obesity Tobacco use disorder Abdominal pain/Atypical chest pain  Plan: Continue IV heparin IV lasix as tolerated. Cardiac catheterization.   Time spent: Review of old records, Lab, x-rays, EKG, other cardiac tests, examination, discussion with patient over 70 minutes.  Ricki Rodriguez, MD  04/19/2022, 12:46 AM

## 2022-04-19 NOTE — Inpatient Diabetes Management (Signed)
Inpatient Diabetes Program Recommendations  AACE/ADA: New Consensus Statement on Inpatient Glycemic Control (2015)  Target Ranges:  Prepandial:   less than 140 mg/dL      Peak postprandial:   less than 180 mg/dL (1-2 hours)      Critically ill patients:  140 - 180 mg/dL   Lab Results  Component Value Date   GLUCAP 327 (H) 04/19/2022   HGBA1C 10.0 (A) 03/04/2022    Review of Glycemic Control  Diabetes history: DM2 Outpatient Diabetes medications: Farxiga 10 mg QD, metformin 500 BID, OmniPod with Humalog ordered, although not started, Tresiba 32 units QD, Humalog 18 units TID + 0-10 TID. Orders for Dexcom Current orders for Inpatient glycemic control: Semglee 10 BID, Novolog 0-20 TID with meals + 4 units TID for meal coverage  336, 402 this am. Has received 21 units of Novolog for correction between 0700 - 0845.   HgbA1C - 10% Endo - [place]  Inpatient Diabetes Program Recommendations:    Consider repeating BMET before cath, noting AG 19 and CO2 20.  Repeat BHB - last one elevated at 1.03  ? Mild DKA from SGLT2  Will continue to follow. Recheck later this am. Watch for hypos with large amount of Novolog given this am.   Thank you. Ailene Ards, RD, LDN, CDCES Inpatient Diabetes Coordinator [phone]

## 2022-04-20 ENCOUNTER — Other Ambulatory Visit (HOSPITAL_COMMUNITY): Payer: Self-pay

## 2022-04-20 ENCOUNTER — Encounter (HOSPITAL_COMMUNITY): Payer: Self-pay | Admitting: Anesthesiology

## 2022-04-20 ENCOUNTER — Encounter (HOSPITAL_COMMUNITY)
Admission: EM | Disposition: A | Discharge status: Home / Self Care | Payer: Self-pay | Source: Home / Self Care | Attending: Cardiovascular Disease

## 2022-04-20 ENCOUNTER — Inpatient Hospital Stay (HOSPITAL_COMMUNITY): Payer: Medicaid Other

## 2022-04-20 DIAGNOSIS — R112 Nausea with vomiting, unspecified: Secondary | ICD-10-CM

## 2022-04-20 DIAGNOSIS — I249 Acute ischemic heart disease, unspecified: Secondary | ICD-10-CM

## 2022-04-20 DIAGNOSIS — I214 Non-ST elevation (NSTEMI) myocardial infarction: Principal | ICD-10-CM

## 2022-04-20 LAB — HEPATIC FUNCTION PANEL
ALT: 15 U/L (ref 0–44)
AST: 18 U/L (ref 15–41)
Albumin: 3.3 g/dL — ABNORMAL LOW (ref 3.5–5.0)
Alkaline Phosphatase: 64 U/L (ref 38–126)
Bilirubin, Direct: 0.1 mg/dL (ref 0.0–0.2)
Indirect Bilirubin: 0.6 mg/dL (ref 0.3–0.9)
Total Bilirubin: 0.7 mg/dL (ref 0.3–1.2)
Total Protein: 6.4 g/dL — ABNORMAL LOW (ref 6.5–8.1)

## 2022-04-20 LAB — BASIC METABOLIC PANEL
Anion gap: 10 (ref 5–15)
BUN: 13 mg/dL (ref 6–20)
CO2: 23 mmol/L (ref 22–32)
Calcium: 8.2 mg/dL — ABNORMAL LOW (ref 8.9–10.3)
Chloride: 105 mmol/L (ref 98–111)
Creatinine, Ser: 0.68 mg/dL (ref 0.44–1.00)
GFR, Estimated: >60 mL/min (ref >60)
Glucose, Bld: 257 mg/dL — ABNORMAL HIGH (ref 70–99)
Potassium: 3.3 mmol/L — ABNORMAL LOW (ref 3.5–5.1)
Sodium: 138 mmol/L (ref 135–145)

## 2022-04-20 LAB — CBC
HCT: 42.5 % (ref 36.0–46.0)
Hemoglobin: 14.1 g/dL (ref 12.0–15.0)
MCH: 28.7 pg (ref 26.0–34.0)
MCHC: 33.2 g/dL (ref 30.0–36.0)
MCV: 86.6 fL (ref 80.0–100.0)
Platelets: 251 10*3/uL (ref 150–400)
RBC: 4.91 MIL/uL (ref 3.87–5.11)
RDW: 13.8 % (ref 11.5–15.5)
WBC: 17.7 10*3/uL — ABNORMAL HIGH (ref 4.0–10.5)
nRBC: 0 % (ref 0.0–0.2)

## 2022-04-20 LAB — MAGNESIUM: Magnesium: 1.9 mg/dL (ref 1.7–2.4)

## 2022-04-20 LAB — RESPIRATORY PANEL BY PCR

## 2022-04-20 LAB — HEPARIN LEVEL (UNFRACTIONATED)
Heparin Unfractionated: <0.1 IU/mL — ABNORMAL LOW (ref 0.30–0.70)
Heparin Unfractionated: <0.1 IU/mL — ABNORMAL LOW (ref 0.30–0.70)

## 2022-04-20 LAB — GLUCOSE, CAPILLARY
Glucose-Capillary: 226 mg/dL — ABNORMAL HIGH (ref 70–99)
Glucose-Capillary: 217 mg/dL — ABNORMAL HIGH (ref 70–99)
Glucose-Capillary: 192 mg/dL — ABNORMAL HIGH (ref 70–99)
Glucose-Capillary: 174 mg/dL — ABNORMAL HIGH (ref 70–99)
Glucose-Capillary: 162 mg/dL — ABNORMAL HIGH (ref 70–99)

## 2022-04-20 LAB — HEMOGLOBIN A1C
Hgb A1c MFr Bld: 9.5 % — ABNORMAL HIGH (ref 4.8–5.6)
Mean Plasma Glucose: 225.95 mg/dL

## 2022-04-20 LAB — LACTIC ACID, PLASMA: Lactic Acid, Venous: 1.6 mmol/L (ref 0.5–1.9)

## 2022-04-20 LAB — TROPONIN I (HIGH SENSITIVITY)
Troponin I (High Sensitivity): 410 ng/L (ref <18)
Troponin I (High Sensitivity): 440 ng/L (ref <18)

## 2022-04-20 LAB — LIPOPROTEIN A (LPA): Lipoprotein (a): 193.2 nmol/L — ABNORMAL HIGH (ref <75.0)

## 2022-04-20 SURGERY — RIGHT/LEFT HEART CATH AND CORONARY ANGIOGRAPHY
Anesthesia: LOCAL

## 2022-04-20 MED ORDER — INSULIN GLARGINE-YFGN 100 UNIT/ML ~~LOC~~ SOLN
14.0000 [IU] | Freq: Two times a day (BID) | SUBCUTANEOUS | Status: DC
  Administered 2022-04-20 – 2022-04-22 (×4): 14 [IU] via SUBCUTANEOUS
  Filled 2022-04-20 (×5): qty 0.14

## 2022-04-20 MED ORDER — AMOXICILLIN 500 MG PO CAPS
500.0000 mg | ORAL_CAPSULE | Freq: Two times a day (BID) | ORAL | Status: AC
  Administered 2022-04-20 – 2022-04-21 (×4): 500 mg via ORAL
  Filled 2022-04-20 (×4): qty 1

## 2022-04-20 MED ORDER — METOCLOPRAMIDE HCL 5 MG/ML IJ SOLN: 5.0000 mg | Freq: Three times a day (TID) | INTRAMUSCULAR | Status: DC | PRN

## 2022-04-20 MED ORDER — DIGOXIN 125 MCG PO TABS
0.1250 mg | ORAL_TABLET | Freq: Every day | ORAL | Status: DC
  Administered 2022-04-20 – 2022-04-22 (×3): 0.125 mg via ORAL
  Filled 2022-04-20 (×3): qty 1

## 2022-04-20 MED ORDER — HEPARIN BOLUS VIA INFUSION
2000.0000 [IU] | Freq: Once | INTRAVENOUS | Status: AC
  Administered 2022-04-20: 2000 [IU] via INTRAVENOUS
  Filled 2022-04-20: qty 2000

## 2022-04-20 MED ORDER — ONDANSETRON HCL 4 MG/2ML IJ SOLN: 4.0000 mg | Freq: Three times a day (TID) | INTRAMUSCULAR | Status: DC

## 2022-04-20 MED ORDER — METOCLOPRAMIDE HCL 5 MG/ML IJ SOLN
10.0000 mg | Freq: Four times a day (QID) | INTRAMUSCULAR | Status: AC
  Administered 2022-04-20 – 2022-04-21 (×5): 10 mg via INTRAVENOUS
  Filled 2022-04-20 (×5): qty 2

## 2022-04-20 MED ORDER — PANTOPRAZOLE SODIUM 40 MG IV SOLR
40.0000 mg | INTRAVENOUS | Status: DC
  Administered 2022-04-21 – 2022-04-22 (×2): 40 mg via INTRAVENOUS
  Filled 2022-04-20 (×2): qty 10

## 2022-04-20 MED ORDER — ONDANSETRON HCL 4 MG/2ML IJ SOLN
4.0000 mg | Freq: Three times a day (TID) | INTRAMUSCULAR | Status: DC
  Administered 2022-04-20 – 2022-04-21 (×3): 4 mg via INTRAVENOUS
  Filled 2022-04-20 (×3): qty 2

## 2022-04-20 MED ORDER — POTASSIUM CHLORIDE 10 MEQ/100ML IV SOLN
10.0000 meq | INTRAVENOUS | Status: AC
  Administered 2022-04-20 (×2): 10 meq via INTRAVENOUS
  Filled 2022-04-20 (×2): qty 100

## 2022-04-20 MED ORDER — LORAZEPAM 0.5 MG PO TABS
0.5000 mg | ORAL_TABLET | Freq: Two times a day (BID) | ORAL | Status: DC
  Administered 2022-04-20 – 2022-04-22 (×5): 0.5 mg via ORAL
  Filled 2022-04-20 (×5): qty 1

## 2022-04-20 MED ORDER — INSULIN GLARGINE-YFGN 100 UNIT/ML ~~LOC~~ SOLN
20.0000 [IU] | Freq: Two times a day (BID) | SUBCUTANEOUS | Status: DC
  Filled 2022-04-20: qty 0.2

## 2022-04-20 NOTE — Progress Notes (Addendum)
Inpatient Diabetes Program Recommendations  AACE/ADA: New Consensus Statement on Inpatient Glycemic Control (2015)  Target Ranges:  Prepandial:   less than 140 mg/dL      Peak postprandial:   less than 180 mg/dL (1-2 hours)      Critically ill patients:  140 - 180 mg/dL   Lab Results  Component Value Date   GLUCAP 217 (H) 04/20/2022   HGBA1C 9.5 (H) 04/20/2022    Review of Glycemic Control  Latest Reference Range & Units 04/19/22 11:08 04/19/22 18:44 04/19/22 21:56 04/20/22 06:07 04/20/22 11:07  Glucose-Capillary 70 - 99 mg/dL 528 (H) 413 (H) 244 (H) 226 (H) 217 (H)  Diabetes history: DM Outpatient Diabetes medications: Farxiga 10 mg QD, metformin 500 BID, OmniPod with Humalog ordered, although not started, Tresiba 32 units QD, Humalog 18 units TID + 0-10 TID. Orders for Dexcom  Current orders for Inpatient glycemic control: Semglee 10 BID, Novolog 0-20 TID with meals + 4 units TID for meal coverage  Inpatient Diabetes Program Recommendations:    Consider increasing Semglee to 14 units bid.   Thanks,  Beryl Meager, RN, BC-ADM Inpatient Diabetes Coordinator Pager [phone]  (8a-5p)  Addendum 1400- Spoke to patient and family regarding DM and insulin pump. Per patient she was supposed to start insulin pump on Friday.  She is wearing her Dexcom sensor.  States she is not taking [place] due to it not being covered by her insurance.  States she is taking Lantus instead.  Patient still states she is feeling poorly.  Will follow.

## 2022-04-20 NOTE — Progress Notes (Signed)
Cardiac cath postponed per heart failure team guidance dure to increased risk. Will wait for GI work up and stabilization of heart failure. Patient and family understood.  Orpah Cobb, MD 04/20/2022 1:57 PM

## 2022-04-20 NOTE — Consult Note (Addendum)
[facility] Consult: 10:54 AM 04/20/2022  LOS: 1 day    Referring Provider: Dr. Aundra Dubin Primary Care Physician:  [facility] Primary Gastroenterologist: remote care in [place].  Previously Dr. Benson Norway but not seen there since 2011.  1 off office visit in September 2022 with Dr. Fuller Plan.  GI care at [facility] and possibly [facility] more recently. Little to no actual GI records.     Reason for Consultation: Nausea, vomiting.   HPI: FAYDRA KORMAN is a 46 y.o. female.  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  IDDM/T2 DM.  Neuropathy.  Anxiety.  Daily cannabis use  Crohn's diagnosed when she was in her early 28A and complications include perirectal fistula and abscesses.  Illogic therapy never initiated due to the positive TB test.  2012 surgical drainage of perirectal abscess, several prior drainage procedures. 06/2010 CT enterography showed no evidence for active Crohn's disease and a enlarged, fatty liver. CTAP of 02/2021 showed a small fat-containing ventral hernia left of the umbilicus associated with mild stranding in the herniated fat, possible strangulation.  She was evaluated in the ED but left AMA. Not currently on any Crohn's meds. Patient seen once in the office by Dr. Fuller Plan in late September 2022 and he referred her to establish care at [facility]. Pt recalls no appts arranged.  Pt has upcoming appointment on 05/05/2022 with a Dr. Jenna Luo, who is a general surgeon at [facility] (? For eval of abdominal hernia?).  Another referral name in the notes is Ambulance person at [facility] primary care in [place].    Presented to ED and triaged 2 days ago.  Complaint of abdominal pain, nonbloody  nausea and vomiting, loose stools.  Symptoms started that morning.  The previous evening she had mild abdominal cramping.  Also endorsed chest heaviness/tightness and dyspnea. Similar symptom presentation on 9/22 attributed to gastroenteritis, poorly controlled diabetes. About a week ago, one of the grandchildren she cares for had a diarrheal illness.  Received antibiotics for a possible sinus infection and had dental procedure about a month ago. Patient gives a history of several years of frequent upset stomach but does not vomit often.  Compliant with omeprazole 40 mg daily.  While she was in [place] she used medical marijuana.  Since returning to [place] she continues to smoke marijuana 3 or 4 times a day to address her GI problems as well as joint pain.  Takes Tylenol every few days and ibuprofen once or twice a month for musculoskeletal/joint pain.   Loose, nonbloody stools occur 3 or 4 times daily.  Has not taken Crohn's specific meds for a long time.  Current PCP prescribes short-term, 7-day, tapers of prednisone to address increased stools and increased abdominal pain.  She uses bismuth periodically and it may or may not be helpful.  Appetite is variable. Started Ozempic a couple of months ago but her chronic intermittent GI symptoms predate that med by years. She  moved back from [place] to [place] a couple of years ago.   Recalls last colonoscopy in [place] maybe 5 years ago and EGD a few years before that.  Currently nausea, dry heaves, foamy white/small volume emesis persist despite zofran, phenergan  TEE With LVEF 25 to 30%.  Mild elevation pulmonary artery pressures, RV systole and RV size normal.  Mild aortic valve calcifications without regurgitation.  No other significant valvular abnormalities  04/18/2022 CTAP W contrast.  No PE, mild interstitial pulmonary edema, no acute intra-abdominal pathology.  Stomach and bowel unremarkable  Troponin I 344..  713..  410.  BNP  136. Glucose 336.  Potassium 3.3.  BUN/creatinine/GFR normal. LFTs, lipase normal.serum  HCG normal.   THC positive.   WBCs 26.. 17.7.  Hgb 14.1.  MCV, platelets, INR are normal.   Triglycerides 342, elevated total cholesterol, LDL, VLDL.  Normal lactic acid. INR 1.1. U/A shows significant glucose urea and proteinuria but no evidence for UTI with rare bacteria and 6-10 WBCs, 6-10 RBCs.  Social history.  Patient has 6 children.  She is divorced.  Children age range from 40 years old to 80 years old.  Does not drink alcoholic products.  Smokes marijuana several times daily  Family history pertinent for mother having undergone cholecystectomy.  Father had heart disease and Crohn's disease.  No family history colorectal or gastric cancers, peptic ulcer disease.  Past Medical History:  Diagnosis Date   Anxiety    Crohn's disease ([place])    Diabetes mellitus    DKA (diabetic ketoacidosis) ([place])    Hypertension    Insomnia    Neuropathy     Past Surgical History:  Procedure Laterality Date   RECTAL SURGERY      Prior to Admission medications   Medication Sig Start Date End Date Taking? Authorizing Provider  acetaminophen (TYLENOL) 325 MG tablet Take 650 mg by mouth every 6 (six) hours as needed for mild pain or moderate pain.   Yes [provider]  atorvastatin (LIPITOR) 80 MG tablet Take 80 mg by mouth at bedtime. 04/06/22  Yes [provider]  dapagliflozin propanediol (FARXIGA) 10 MG TABS tablet Take 1 tablet (10 mg total) by mouth daily. 03/04/22  Yes Shamleffer, Melanie Crazier, MD  escitalopram (LEXAPRO) 20 MG tablet Take 20 mg by mouth daily. 09/11/20  Yes [provider]  gabapentin (NEURONTIN) 300 MG capsule Take 600-900 mg by mouth 4 (four) times daily.   Yes [provider]  insulin lispro (HUMALOG KWIKPEN) 100 UNIT/ML KwikPen Max daily 60 units 03/04/22  Yes Shamleffer, Melanie Crazier, MD  lisinopril (ZESTRIL) 10 MG tablet Take 20 mg by mouth  daily. 10/02/20  Yes [provider]  metFORMIN (GLUCOPHAGE-XR) 500 MG 24 hr tablet Take 1 tablet (500 mg total) by mouth 2 (two) times daily with a meal. 03/04/22  Yes Shamleffer, Melanie Crazier, MD  OZEMPIC, 1 MG/DOSE, 4 MG/3ML SOPN Inject 1 mg into the skin once a week. 03/29/22  Yes [provider]  promethazine (PHENERGAN) 25 MG tablet promethazine 25 mg tablet  Take 1 tablet every 4 hours by oral route as needed.   Yes [provider]  zolpidem (AMBIEN) 10 MG tablet Take 10 mg by mouth at bedtime as needed for sleep. 10/02/20  Yes [provider]  ACCU-CHEK GUIDE test strip 1 each by Other route in the morning, at noon, in the evening, and at bedtime. Use as instructed 01/26/21   Shamleffer, Melanie Crazier, MD  acetaminophen-codeine (TYLENOL #3)  300-30 MG tablet Take 1 tablet by mouth every 6 (six) hours as needed. Patient not taking: Reported on 04/19/2022 03/10/22   [provider]  Blood Glucose Monitoring Suppl (ACCU-CHEK GUIDE ME) w/Device KIT 1 Device by Does not apply route in the morning, at noon, in the evening, and at bedtime. 01/26/21   Shamleffer, Melanie Crazier, MD  Continuous Blood Gluc Sensor (DEXCOM G6 SENSOR) MISC USE 1 device AS DIRECTED 12/14/21   Shamleffer, Melanie Crazier, MD  Continuous Blood Gluc Transmit (DEXCOM G6 TRANSMITTER) MISC USE AS DIRECTED with dexcom 02/11/22   Shamleffer, Melanie Crazier, MD  insulin degludec (TRESIBA FLEXTOUCH) 100 UNIT/ML FlexTouch Pen Inject 32 Units into the skin daily. Patient not taking: Reported on 04/19/2022 03/04/22   Shamleffer, Melanie Crazier, MD  Insulin Disposable Pump (OMNIPOD 5 G6 INTRO, GEN 5,) KIT 1 Device by Does not apply route every other day. 03/04/22   Shamleffer, Melanie Crazier, MD  Insulin Disposable Pump (OMNIPOD 5 G6 POD, GEN 5,) MISC 1 Device by Does not apply route every other day. 03/04/22   Shamleffer, Melanie Crazier, MD  Insulin Pen Needle 32G X 4 MM MISC 1 Device by Does  not apply route in the morning, at noon, in the evening, and at bedtime. 10/20/20   Shamleffer, Melanie Crazier, MD  omeprazole (PRILOSEC) 40 MG capsule Take 1 capsule by mouth daily. Patient not taking: Reported on 04/19/2022    [provider]    Scheduled Meds:  amoxicillin  500 mg Oral Q12H   aspirin EC  81 mg Oral Daily   atorvastatin  80 mg Oral QHS   Chlorhexidine Gluconate Cloth  6 each Topical Q0600   digoxin  0.125 mg Oral Daily   escitalopram  20 mg Oral Daily   gabapentin  300 mg Oral QID   heparin  2,000 Units Intravenous Once   insulin aspart  0-20 Units Subcutaneous TID WC   insulin aspart  4 Units Subcutaneous TID WC   insulin glargine-yfgn  10 Units Subcutaneous BID   metoprolol tartrate  12.5 mg Oral BID   mupirocin ointment  1 Application Nasal BID   pantoprazole (PROTONIX) IV  40 mg Intravenous Q24H   potassium chloride  20 mEq Oral BID   sodium chloride flush  10-40 mL Intracatheter Q12H   sodium chloride flush  3 mL Intravenous Q12H   Infusions:  sodium chloride     sodium chloride 20 mL/hr at 04/19/22 1800   heparin 1,150 Units/hr (04/20/22 0835)   nitroGLYCERIN 20 mcg/min (04/20/22 0714)   promethazine (PHENERGAN) injection (IM or IVPB) 12.5 mg (04/20/22 0114)   PRN Meds: sodium chloride, acetaminophen, metoCLOPramide (REGLAN) injection, morphine injection, nitroGLYCERIN, ondansetron (ZOFRAN) IV, mouth rinse, promethazine (PHENERGAN) injection (IM or IVPB), promethazine, sodium chloride flush, sodium chloride flush, zolpidem   Allergies as of 04/18/2022 - Review Complete 04/18/2022  Allergen Reaction Noted   Ceclor [cefaclor] Hives 08/04/2010    Family History  Problem Relation Age of Onset   Heart disease Father    Crohn's disease Father    Colon cancer Neg Hx    Rectal cancer Neg Hx    Esophageal cancer Neg Hx     Social History   Socioeconomic History   Marital status: Divorced    Spouse name: Not on file   Number of children: 6    Years of education: Not on file   Highest education level: Not on file  Occupational History   Occupation: Disable  Tobacco Use  Smoking status: Every Day    Packs/day: 0.50    Types: Cigarettes   Smokeless tobacco: Never  Vaping Use   Vaping Use: Former  Substance and Sexual Activity   Alcohol use: No   Drug use: No   Sexual activity: Not on file  Other Topics Concern   Not on file  Social History Narrative   ** Merged History Encounter **       Social Determinants of Health   Financial Resource Strain: Not on file  Food Insecurity: Not on file  Transportation Needs: Not on file  Physical Activity: Not on file  Stress: Not on file  Social Connections: Not on file  Intimate Partner Violence: Not on file    REVIEW OF SYSTEMS: Constitutional: Weakness, fatigue ENT:  No nose bleeds Pulm: Denies active cough.  Endorses dyspnea on exertion, no PND. CV:  No palpitations, no LE edema.  GU:  No hematuria, no frequency GI: See HPI.  No dysphagia. Heme: Denies unusual or excessive bleeding or bruising Transfusions: None Neuro:  No headaches, no peripheral tingling or numbness Derm:  No itching, no rash or sores.  Endocrine:  No sweats or chills.  No polyuria or dysuria Immunization: Not queried.  Reviewed.  Records indicates she was vaccinated for [place] in 01/2020 but no other immunization recorded. Travel:  Not queried.   PHYSICAL EXAM: Vital signs in last 24 hours: Vitals:   04/20/22 0400 04/20/22 0450  BP: (!) 138/102 124/87  Pulse:    Resp: 20   Temp:    SpO2:     Wt Readings from Last 3 Encounters:  04/20/22 88.9 kg  03/04/22 94.8 kg  02/26/21 93.9 kg    General: Patient looks fairly severely chronically ill.  She is alert and able to provide history.  Relatively comfortable though less so when she has episodes of limited emesis and retching Head: No facial asymmetry.    No signs of head trauma. Eyes: Conjunctiva pink.  No scleral icterus.  Some  periorbital swelling which is symmetric bilaterally Ears: No obvious hearing deficits. Nose: No congestion or discharge Mouth: Tongue midline.  Mucosa moist, pink, clear. Neck: No mass, no thyromegaly. Lungs: No labored breathing or cough.  Lungs clear bilaterally. Heart: RRR.  S1, S2 present.  No MRG. Abdomen: Soft.  Nondistended.  Midline hernia with Valsalva.  Slight tenderness in the right abdomen but no guarding or rebound.  Bowel sounds present, slightly hypoactive..   Rectal: Deferred. Musc/Skeltl: No joint redness, swelling or gross deformity. Extremities: No CCE. Neurologic: No confusion.  Alert and oriented x3.  Moves all 4 limbs without tremor, strength not tested.  No gross deficits. Skin: No telangiectasia, significant purpura/bruising or suspicious lesions.  No rash. Nodes: No cervical adenopathy Psych: Affect flat but pleasant and cooperative.  Intake/Output from previous day: 11/14 0701 - 11/15 0700 In: 1686.9 [P.O.:780; I.V.:906.9] Out: 3 [Urine:3] Intake/Output this shift: Total I/O In: 183.7 [I.V.:183.7] Out: -   LAB RESULTS: Recent Labs    04/18/22 1453 04/19/22 0449 04/20/22 0415  WBC 21.3* 26.2* 17.7*  HGB 16.3* 16.0* 14.1  HCT 47.7* 47.0* 42.5  PLT 327 371 251   BMET Lab Results  Component Value Date   NA 138 04/20/2022   NA 136 04/19/2022   NA 140 04/18/2022   K 3.3 (L) 04/20/2022   K 3.3 (L) 04/19/2022   K 3.4 (L) 04/18/2022   CL 105 04/20/2022   CL 97 (L) 04/19/2022   CL 104 04/18/2022  CO2 23 04/20/2022   CO2 20 (L) 04/19/2022   CO2 22 04/18/2022   GLUCOSE 257 (H) 04/20/2022   GLUCOSE 336 (H) 04/19/2022   GLUCOSE 257 (H) 04/18/2022   BUN 13 04/20/2022   BUN 10 04/19/2022   BUN 10 04/18/2022   CREATININE 0.68 04/20/2022   CREATININE 0.72 04/19/2022   CREATININE 0.61 04/18/2022   CALCIUM 8.2 (L) 04/20/2022   CALCIUM 8.9 04/19/2022   CALCIUM 9.3 04/18/2022   LFT Recent Labs    04/18/22 1535  PROT 8.1  ALBUMIN 4.3  AST 21   ALT 20  ALKPHOS 74  BILITOT 0.9   PT/INR Lab Results  Component Value Date   INR 1.1 04/19/2022   Hepatitis Panel No results for input(s): "HEPBSAG", "HCVAB", "HEPAIGM", "HEPBIGM" in the last 72 hours. C-Diff No components found for: "CDIFF" Lipase     Component Value Date/Time   LIPASE 39 04/18/2022 1535    Drugs of Abuse     Component Value Date/Time   LABOPIA NONE DETECTED 04/18/2022 2351   COCAINSCRNUR NONE DETECTED 04/18/2022 2351   LABBENZ NONE DETECTED 04/18/2022 2351   AMPHETMU NONE DETECTED 04/18/2022 2351   THCU POSITIVE (A) 04/18/2022 2351   LABBARB NONE DETECTED 04/18/2022 2351     RADIOLOGY STUDIES: CT HEAD WO CONTRAST (5MM)  Result Date: 04/20/2022 CLINICAL DATA:  Vomiting, hyperglycemia. EXAM: CT HEAD WITHOUT CONTRAST TECHNIQUE: Contiguous axial images were obtained from the base of the skull through the vertex without intravenous contrast. RADIATION DOSE REDUCTION: This exam was performed according to the departmental dose-optimization program which includes automated exposure control, adjustment of the mA and/or kV according to patient size and/or use of iterative reconstruction technique. COMPARISON:  None Available. FINDINGS: Brain: No evidence of acute infarction, hemorrhage, hydrocephalus, extra-axial collection or mass lesion/mass effect. Vascular: No hyperdense vessel or unexpected calcification. Skull: Negative for fracture or focal lesion. Sinuses/Orbits: No acute finding. Other: None. IMPRESSION: No acute intracranial pathology. Electronically Signed   By: Pedro Earls M.D.   On: 04/20/2022 10:00   ECHOCARDIOGRAM COMPLETE  Result Date: 04/19/2022    ECHOCARDIOGRAM REPORT   Patient Name:   VIDYA BAMFORD Date of Exam: 04/19/2022 Medical Rec #:  [id number]         Height:       64.0 in Accession #:    [id number]        Weight:       195.1 lb Date of Birth:  Apr 19, 1976        BSA:          1.936 m Patient Age:    46 years           BP:           125/100 mmHg Patient Gender: F                 HR:           112 bpm. Exam Location:  Inpatient Procedure: 2D Echo, Cardiac Doppler, Color Doppler and Intracardiac            Opacification Agent Indications:     Acute Ischemic HD  History:         Patient has no prior history of Echocardiogram examinations.                  Risk Factors:Diabetes, Hypertension and Dyslipidemia.  Sonographer:     Wenda Low Referring Phys:  [place] Diagnosing Phys: Dixie Dials MD IMPRESSIONS  1. Left ventricular ejection fraction, by estimation, is 25 to 30%. The left ventricle has severely decreased function. The left ventricle demonstrates global hypokinesis. The left ventricular internal cavity size was mildly dilated. Left ventricular diastolic parameters are consistent with Grade II diastolic dysfunction (pseudonormalization). There is severe hypokinesis of the left ventricular, mid anteroseptal wall. There is moderate hypokinesis of the left ventricular, mid anterolateral wall. There is mild hypokinesis of the left ventricular, entire inferolateral wall.  2. Right ventricular systolic function is normal. The right ventricular size is normal. There is mildly elevated pulmonary artery systolic pressure.  3. Left atrial size was moderately dilated.  4. Right atrial size was mildly dilated.  5. The mitral valve is normal in structure. Moderate mitral valve regurgitation.  6. The aortic valve is tricuspid. There is mild calcification of the aortic valve. Aortic valve regurgitation is not visualized. Aortic valve sclerosis is present, with no evidence of aortic valve stenosis.  7. The inferior vena cava is dilated in size with <50% respiratory variability, suggesting right atrial pressure of 15 mmHg. FINDINGS  Left Ventricle: Left ventricular ejection fraction, by estimation, is 25 to 30%. The left ventricle has severely decreased function. The left ventricle demonstrates global hypokinesis. Severe  hypokinesis of the left ventricular, mid anteroseptal wall. Moderate hypokinesis of the left ventricular, mid anterolateral wall. Mild hypokinesis of the left ventricular, entire inferolateral wall. Definity contrast agent was given IV to delineate the left ventricular endocardial borders. The left ventricular internal cavity size was mildly dilated. There is borderline concentric left ventricular hypertrophy. Left ventricular diastolic parameters are consistent with Grade II diastolic dysfunction (pseudonormalization).  LV Wall Scoring: The entire anterior wall, entire lateral wall, inferior wall, mid anteroseptal segment, and mid inferoseptal segment are hypokinetic. The basal anteroseptal segment, apical septal segment, apical inferior segment, basal inferoseptal segment, and apex are normal. Right Ventricle: The right ventricular size is normal. No increase in right ventricular wall thickness. Right ventricular systolic function is normal. There is mildly elevated pulmonary artery systolic pressure. The tricuspid regurgitant velocity is 2.89  m/s, and with an assumed right atrial pressure of 8 mmHg, the estimated right ventricular systolic pressure is 88.8 mmHg. Left [facility]: Left atrial size was moderately dilated. Right [facility]: Right atrial size was mildly dilated. Pericardium: There is no evidence of pericardial effusion. Mitral Valve: The mitral valve is normal in structure. Moderate mitral valve regurgitation. MV peak gradient, 2.9 mmHg. The mean mitral valve gradient is 1.0 mmHg. Tricuspid Valve: The tricuspid valve is normal in structure. Tricuspid valve regurgitation is trivial. Aortic Valve: The aortic valve is tricuspid. There is mild calcification of the aortic valve. Aortic valve regurgitation is not visualized. Aortic valve sclerosis is present, with no evidence of aortic valve stenosis. Aortic valve mean gradient measures 1.0 mmHg. Aortic valve peak gradient measures 2.4 mmHg. Aortic valve area, by  VTI measures 3.46 cm. Pulmonic Valve: The pulmonic valve was normal in structure. Pulmonic valve regurgitation is trivial. Aorta: The aortic root is normal in size and structure. Venous: The inferior vena cava is dilated in size with less than 50% respiratory variability, suggesting right atrial pressure of 15 mmHg. IAS/Shunts: The atrial septum is grossly normal.  LEFT VENTRICLE PLAX 2D LVIDd:         5.00 cm   Diastology LVIDs:         4.50 cm   LV e' medial:    7.40 cm/s LV PW:         1.00 cm  LV E/e' medial:  11.8 LV IVS:        1.00 cm   LV e' lateral:   5.98 cm/s LVOT diam:     2.00 cm   LV E/e' lateral: 14.5 LV SV:         44 LV SV Index:   23 LVOT Area:     3.14 cm  RIGHT VENTRICLE RV Basal diam:  3.50 cm RV Mid diam:    2.90 cm RV S prime:     11.00 cm/s TAPSE (M-mode): 2.3 cm LEFT ATRIUM             Index        RIGHT ATRIUM           Index LA diam:        4.00 cm 2.07 cm/m   RA Area:     17.80 cm LA Vol (A2C):   64.3 ml 33.21 ml/m  RA Volume:   49.10 ml  25.36 ml/m LA Vol (A4C):   76.4 ml 39.47 ml/m LA Biplane Vol: 73.1 ml 37.76 ml/m  AORTIC VALVE                    PULMONIC VALVE AV Area (Vmax):    3.15 cm     PV Vmax:       0.71 m/s AV Area (Vmean):   2.90 cm     PV Peak grad:  2.0 mmHg AV Area (VTI):     3.46 cm AV Vmax:           77.60 cm/s AV Vmean:          55.700 cm/s AV VTI:            0.127 m AV Peak Grad:      2.4 mmHg AV Mean Grad:      1.0 mmHg LVOT Vmax:         77.70 cm/s LVOT Vmean:        51.500 cm/s LVOT VTI:          0.140 m LVOT/AV VTI ratio: 1.10  AORTA Ao Root diam: 3.30 cm MITRAL VALVE               TRICUSPID VALVE MV Area (PHT): 5.66 cm    TR Peak grad:   33.4 mmHg MV Area VTI:   2.78 cm    TR Vmax:        289.00 cm/s MV Peak grad:  2.9 mmHg MV Mean grad:  1.0 mmHg    SHUNTS MV Vmax:       0.86 m/s    Systemic VTI:  0.14 m MV Vmean:      47.1 cm/s   Systemic Diam: 2.00 cm MV Decel Time: 134 msec MV E velocity: 87.00 cm/s MV A velocity: 44.60 cm/s MV E/A ratio:  1.95  Dixie Dials MD Electronically signed by Dixie Dials MD Signature Date/Time: 04/19/2022/5:36:58 PM    Final    DG Chest Portable 1 View  Result Date: 04/18/2022 CLINICAL DATA:  Dyspnea EXAM: PORTABLE CHEST 1 VIEW COMPARISON:  None Available. FINDINGS: The lungs are symmetrically well inflated. Mild to moderate interstitial pulmonary edema. No pneumothorax or pleural effusion. Cardiac size within normal limits. No acute bone abnormality. IMPRESSION: 1. Mild to moderate interstitial pulmonary edema. Electronically Signed   By: Fidela Salisbury M.D.   On: 04/18/2022 21:06   CT Angio Chest PE W and/or Wo Contrast  Result Date: 04/18/2022 CLINICAL DATA:  Pulmonary embolism (PE) suspected, high prob; Abdominal pain, acute, nonlocalized. Nausea, vomiting, tachycardia, hyperglycemia. EXAM: CT ANGIOGRAPHY CHEST CT ABDOMEN AND PELVIS WITH CONTRAST TECHNIQUE: Multidetector CT imaging of the chest was performed using the standard protocol during bolus administration of intravenous contrast. Multiplanar CT image reconstructions and MIPs were obtained to evaluate the vascular anatomy. Multidetector CT imaging of the abdomen and pelvis was performed using the standard protocol during bolus administration of intravenous contrast. RADIATION DOSE REDUCTION: This exam was performed according to the departmental dose-optimization program which includes automated exposure control, adjustment of the mA and/or kV according to patient size and/or use of iterative reconstruction technique. CONTRAST:  24m OMNIPAQUE IOHEXOL 350 MG/ML SOLN COMPARISON:  None Available. FINDINGS: CTA CHEST FINDINGS Cardiovascular: Adequate opacification of the pulmonary arterial tree. No intraluminal filling defect identified to suggest acute pulmonary embolism. Central pulmonary arteries are of normal caliber. No significant coronary artery calcification. Cardiac size within normal limits. No pericardial effusion. No significant atherosclerotic  calcification within the thoracic aorta. No aortic aneurysm. Mediastinum/Nodes: No enlarged mediastinal, hilar, or axillary lymph nodes. Thyroid gland, trachea, and esophagus demonstrate no significant findings. Lungs/Pleura: There is diffuse smooth interlobular septal thickening in keeping with mild interstitial pulmonary edema. No confluent pulmonary infiltrate. No pneumothorax or pleural effusion. No central obstructing lesion. Musculoskeletal: No chest wall abnormality. No acute or significant osseous findings. Review of the MIP images confirms the above findings. CT ABDOMEN and PELVIS FINDINGS Hepatobiliary: No focal liver abnormality is seen. No gallstones, gallbladder wall thickening, or biliary dilatation. Pancreas: Unremarkable Spleen: Unremarkable Adrenals/Urinary Tract: Adrenal glands are unremarkable. Kidneys are normal, without renal calculi, focal lesion, or hydronephrosis. Bladder is unremarkable. Stomach/Bowel: Stomach is within normal limits. Appendix appears normal. No evidence of bowel wall thickening, distention, or inflammatory changes. Vascular/Lymphatic: Aortic atherosclerosis. No enlarged abdominal or pelvic lymph nodes. Reproductive: Uterus and bilateral adnexa are unremarkable. Other: Small left parasagittal fat containing supraumbilical hernia. No abdominopelvic ascites. Musculoskeletal: No acute bone abnormality. Degenerative changes seen within the lumbar spine. Review of the MIP images confirms the above findings. IMPRESSION: 1. No pulmonary embolism. 2. Mild interstitial pulmonary edema. 3. No acute intra-abdominal pathology identified. Electronically Signed   By: AFidela SalisburyM.D.   On: 04/18/2022 20:07   CT ABDOMEN PELVIS W CONTRAST  Result Date: 04/18/2022 CLINICAL DATA:  Pulmonary embolism (PE) suspected, high prob; Abdominal pain, acute, nonlocalized. Nausea, vomiting, tachycardia, hyperglycemia. EXAM: CT ANGIOGRAPHY CHEST CT ABDOMEN AND PELVIS WITH CONTRAST TECHNIQUE:  Multidetector CT imaging of the chest was performed using the standard protocol during bolus administration of intravenous contrast. Multiplanar CT image reconstructions and MIPs were obtained to evaluate the vascular anatomy. Multidetector CT imaging of the abdomen and pelvis was performed using the standard protocol during bolus administration of intravenous contrast. RADIATION DOSE REDUCTION: This exam was performed according to the departmental dose-optimization program which includes automated exposure control, adjustment of the mA and/or kV according to patient size and/or use of iterative reconstruction technique. CONTRAST:  856mOMNIPAQUE IOHEXOL 350 MG/ML SOLN COMPARISON:  None Available. FINDINGS: CTA CHEST FINDINGS Cardiovascular: Adequate opacification of the pulmonary arterial tree. No intraluminal filling defect identified to suggest acute pulmonary embolism. Central pulmonary arteries are of normal caliber. No significant coronary artery calcification. Cardiac size within normal limits. No pericardial effusion. No significant atherosclerotic calcification within the thoracic aorta. No aortic aneurysm. Mediastinum/Nodes: No enlarged mediastinal, hilar, or axillary lymph nodes. Thyroid gland, trachea, and esophagus demonstrate no significant findings. Lungs/Pleura: There is diffuse smooth interlobular septal thickening in  keeping with mild interstitial pulmonary edema. No confluent pulmonary infiltrate. No pneumothorax or pleural effusion. No central obstructing lesion. Musculoskeletal: No chest wall abnormality. No acute or significant osseous findings. Review of the MIP images confirms the above findings. CT ABDOMEN and PELVIS FINDINGS Hepatobiliary: No focal liver abnormality is seen. No gallstones, gallbladder wall thickening, or biliary dilatation. Pancreas: Unremarkable Spleen: Unremarkable Adrenals/Urinary Tract: Adrenal glands are unremarkable. Kidneys are normal, without renal calculi, focal  lesion, or hydronephrosis. Bladder is unremarkable. Stomach/Bowel: Stomach is within normal limits. Appendix appears normal. No evidence of bowel wall thickening, distention, or inflammatory changes. Vascular/Lymphatic: Aortic atherosclerosis. No enlarged abdominal or pelvic lymph nodes. Reproductive: Uterus and bilateral adnexa are unremarkable. Other: Small left parasagittal fat containing supraumbilical hernia. No abdominopelvic ascites. Musculoskeletal: No acute bone abnormality. Degenerative changes seen within the lumbar spine. Review of the MIP images confirms the above findings. IMPRESSION: 1. No pulmonary embolism. 2. Mild interstitial pulmonary edema. 3. No acute intra-abdominal pathology identified. Electronically Signed   By: Fidela Salisbury M.D.   On: 04/18/2022 20:07      IMPRESSION:   Acute nausea, vomiting.  Background of longstanding periodic nausea, less frequent vomiting.  Compliant with daily omeprazole.  Current acute symptoms may be due to her NSTEMI.  She reports initial visual of what sounds like CGE which has resolved so could have poorly controlled reflux.  Her suboptimally controlled diabetes puts her at risk for diabetic gastroparesis.  Regular marijuana smoking puts her at risk for cannabis hyperemesis.    Chronic nonbloody loose stools.  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  Off all Crohn's meds for many years but PCP periodically treats with short course steroids.  If her recall is correct she has not had a colonoscopy in 5 or more years, I suspect its been longer than that but no records to review.  NSTEMI.  New diagnosis of HFrEF, EF 25 to 30%    T2 IDDM.  A1c 9.5, consistent with suboptimal control    PLAN:     Plan is for cardiac catheterization this afternoon.  Orders placed for EGD, tomorrow timing TBD.  She can have PO up until midnight but should be  n.p.o. after midnight.   Azucena Freed  04/20/2022, 10:54 AM Phone [phone]

## 2022-04-20 NOTE — Progress Notes (Signed)
ANTICOAGULATION CONSULT NOTE  Pharmacy Consult for heparin Indication: chest pain/ACS  Allergies  Allergen Reactions   Ceclor [Cefaclor] Hives    Patient Measurements: Height: 5\' 4"  (162.6 cm) Weight: 88.9 kg (195 lb 15.8 oz) IBW/kg (Calculated) : 54.7 Heparin Dosing Weight: 75kg  Vital Signs: Temp: 98.6 F (37 C) (11/15 1951) Temp Source: Oral (11/15 1951) BP: 135/77 (11/15 1951) Pulse Rate: 118 (11/15 1108)  Labs: Recent Labs    04/18/22 1453 04/18/22 1527 04/18/22 1535 04/18/22 2225 04/19/22 0031 04/19/22 0449 04/19/22 1117 04/19/22 1350 04/20/22 0415 04/20/22 0650 04/20/22 0742 04/20/22 1142 04/20/22 2219  HGB 16.3*  --   --   --   --  16.0*  --   --  14.1  --   --   --   --   HCT 47.7*  --   --   --   --  47.0*  --   --  42.5  --   --   --   --   PLT 327  --   --   --   --  371  --   --  251  --   --   --   --   APTT  --   --   --   --  24  --   --   --   --   --   --   --   --   LABPROT  --   --   --   --  13.8  --   --   --   --   --   --   --   --   INR  --   --   --   --  1.1  --   --   --   --   --   --   --   --   HEPARINUNFRC  --   --   --   --   --   --    < > <0.10*  --  <0.10*  --   --  <0.10*  CREATININE  --   --  0.61  --   --  0.72  --   --  0.68  --   --   --   --   TROPONINIHS  --    < >  --  713*  --   --   --   --   --   --  410* 440*  --    < > = values in this interval not displayed.     Estimated Creatinine Clearance: 95.9 mL/min (by C-G formula based on SCr of 0.68 mg/dL).   Medical History: Past Medical History:  Diagnosis Date   Anxiety    Crohn's disease (HCC)    Diabetes mellitus    DKA (diabetic ketoacidosis) (HCC)    Hypertension    Insomnia    Neuropathy     Assessment: 46yo female with medical history of insulin-dependent DM, crohn disease, anxiety, daily cannabis use presents to the ED with complaint of abd pain, N/V, diarrhea.  Pharmacy consulted to dose heparin drip for ACS/STEMI.  No prior AC noted  Heparin  level came back undetectable, on heparin infusion at 1300 units/hr. Hgb 14.1, plt 251. LDH 410. No s/sx of bleeding..   Goal of Therapy:  Heparin level 0.3-0.7 units/ml Monitor platelets by anticoagulation protocol: Yes   Plan:  Increase heparin drip 1450 units/hr Heparin level in 6  hours Daily CBC  Reece Leader, Colon Flattery, [facility] Clinical Pharmacist  04/20/2022 10:39 PM   [facility] pharmacy phone numbers are listed on [email or web address]

## 2022-04-20 NOTE — Consult Note (Signed)
Advanced Heart Failure Team Consult Note   Primary Physician: [facility] PCP-Cardiologist:  Dr Doylene Canard   Reason for Consultation: Acute Heart Failure   HPI:    [name] is seen today for evaluation of acute HFrEF at the request of Dr Doylene Canard.   [name] is a 46 year old with history of DMII, anxiety, HTN, obesity, Chrons Disease, and tobacco abuse. Started ozempic 2 months ago.   Developed nausea and vomiting 3 days ago. She takes care of her grandchildren and 7 days ago one them had diarrhea. Developed shortness of breath with exertion about a month ago. Had dental procedure a month ago. Placed on antibiotics for possible sinus infection.  No previous cardiac history.  Grandmother had heart failure.  Presented to [facility] ED with persistent nausea and vomiting. Admitted with N/V, abd pain, and increased shortness of breath. EKG- Sinus Tach. CXR- pulmonary edema. CTA- no PE and mild pulmonary edema. HS Trop [id number]. Hgb A1C 9.5, WBC 17.7, hgb 14, LDL 195, LPA 193, and HIV NR. Started on IV heparin and given IV lasix. Echo EF 25-30%, global HK, Grade II DD, RV normal, LA moderately dilated.  Given zofran for n/v with no relief. Switched to phenergan.  She is requesting GI consult. Earlier this morning had headache. On NTG drip at 6 mcg. Sent for CT head which was negative.    HF consulted today for new acute HFrEF.   Review of Systems: [y] = yes, _0  = no   General: Weight gain _1 ; Weight loss _2 ; Anorexia _3 ; Fatigue [Y ]; Fever [Y ]; Chills _4 ; Weakness [ Y]  Cardiac: Chest pain/pressure _5 ; Resting SOB _6 ; Exertional SOB [ Y]; Orthopnea _7 ; Pedal Edema _8 ; Palpitations _9 ; Syncope _10 ; Presyncope _11 ; Paroxysmal nocturnal dyspnea_12   Pulmonary: Cough _13 ; Wheezing_14 ; Hemoptysis_15 ; Sputum _16 ; Snoring _17   GI: Vomiting[Y ]; Dysphagia_18 ; Melena_19 ; Hematochezia _20 ; Heartburn_21 ; Abdominal pain [Y ]; Constipation _22 ; Diarrhea _23 ; BRBPR _24    GU: Hematuria_25 ; Dysuria _26 ; Nocturia_27   Vascular: Pain in legs with walking _28 ; Pain in feet with lying flat _29 ; Non-healing sores _30 ; Stroke _31 ; TIA _32 ; Slurred speech _33 ;  Neuro: Headaches_34 ; Vertigo_35 ; Seizures_36 ; Paresthesias_37 ;Blurred vision _38 ; Diplopia _39 ; Vision changes _40   Ortho/Skin: Arthritis _41 ; Joint pain [ Y]; Muscle pain _42 ; Joint swelling _43 ; Back Pain _44 ; Rash _45   Psych: Depression_46 ; Anxiety_47   Heme: Bleeding problems _48 ; Clotting disorders _49 ; Anemia _50   Endocrine: Diabetes [ Y]; Thyroid dysfunction_51   Home Medications Prior to Admission medications   Medication Sig Start Date End Date Taking? Authorizing Provider  acetaminophen (TYLENOL) 325 MG tablet Take 650 mg by mouth every 6 (six) hours as needed for mild pain or moderate pain.   Yes [provider]  atorvastatin (LIPITOR) 80 MG tablet Take 80 mg by mouth at bedtime. 04/06/22  Yes [provider]  dapagliflozin propanediol (FARXIGA) 10 MG TABS tablet Take 1 tablet (10 mg total) by mouth daily. 03/04/22  Yes Shamleffer, Melanie Crazier, MD  escitalopram (LEXAPRO) 20 MG tablet Take 20 mg by mouth daily. 09/11/20  Yes [provider]  gabapentin (NEURONTIN) 300 MG capsule Take 600-900 mg by mouth 4 (four) times daily.  Yes [provider]  insulin lispro (HUMALOG KWIKPEN) 100 UNIT/ML KwikPen Max daily 60 units 03/04/22  Yes Shamleffer, Melanie Crazier, MD  lisinopril (ZESTRIL) 10 MG tablet Take 20 mg by mouth daily. 10/02/20  Yes [provider]  metFORMIN (GLUCOPHAGE-XR) 500 MG 24 hr tablet Take 1 tablet (500 mg total) by mouth 2 (two) times daily with a meal. 03/04/22  Yes Shamleffer, Melanie Crazier, MD  OZEMPIC, 1 MG/DOSE, 4 MG/3ML SOPN Inject 1 mg into the skin once a week. 03/29/22  Yes [provider]  promethazine (PHENERGAN) 25 MG tablet promethazine 25 mg tablet  Take 1 tablet every 4 hours by oral route as needed.   Yes [provider]  zolpidem (AMBIEN) 10 MG tablet Take 10 mg by mouth at bedtime as needed for sleep. 10/02/20  Yes [provider]  ACCU-CHEK GUIDE test strip 1 each by Other route in the morning, at noon, in the evening, and at bedtime. Use as instructed 01/26/21   Shamleffer, Melanie Crazier, MD  acetaminophen-codeine (TYLENOL #3) 300-30 MG tablet Take 1 tablet by mouth every 6 (six) hours as needed. Patient not taking: Reported on 04/19/2022 03/10/22   [provider]  Blood Glucose Monitoring Suppl (ACCU-CHEK GUIDE ME) w/Device KIT 1 Device by Does not apply route in the morning, at noon, in the evening, and at bedtime. 01/26/21   Shamleffer, Melanie Crazier, MD  Continuous Blood Gluc Sensor (DEXCOM G6 SENSOR) MISC USE 1 device AS DIRECTED 12/14/21   Shamleffer, Melanie Crazier, MD  Continuous Blood Gluc Transmit (DEXCOM G6 TRANSMITTER) MISC USE AS DIRECTED with dexcom 02/11/22   Shamleffer, Melanie Crazier, MD  insulin degludec (TRESIBA FLEXTOUCH) 100 UNIT/ML FlexTouch Pen Inject 32 Units into the skin daily. Patient not taking: Reported on 04/19/2022 03/04/22   Shamleffer, Melanie Crazier, MD  Insulin Disposable Pump (OMNIPOD 5 G6 INTRO, GEN 5,) KIT 1 Device by Does not apply route every other day. 03/04/22   Shamleffer, Melanie Crazier, MD  Insulin Disposable Pump (OMNIPOD 5 G6 POD, GEN 5,) MISC 1 Device by Does not apply route every other day. 03/04/22   Shamleffer, Melanie Crazier, MD  Insulin Pen Needle 32G X 4 MM MISC 1 Device by Does not apply route in the morning, at noon, in the evening, and at bedtime. 10/20/20   Shamleffer, Melanie Crazier, MD  omeprazole (PRILOSEC) 40 MG capsule Take 1 capsule by mouth daily. Patient not taking: Reported on 04/19/2022    [provider]    Past Medical History: Past Medical History:  Diagnosis Date   Anxiety    Crohn's disease ([place])    Diabetes mellitus    DKA (diabetic ketoacidosis) ([place])    Hypertension    Insomnia     Neuropathy     Past Surgical History: Past Surgical History:  Procedure Laterality Date   RECTAL SURGERY      Family History: Family History  Problem Relation Age of Onset   Heart disease Father    Crohn's disease Father    Colon cancer Neg Hx    Rectal cancer Neg Hx    Esophageal cancer Neg Hx     Social History: Social History   Socioeconomic History   Marital status: Divorced    Spouse name: Not on file   Number of children: 6   Years of education: Not on file   Highest education level: Not on file  Occupational History   Occupation: Disable  Tobacco Use   Smoking status: Every Day  Packs/day: 0.50    Types: Cigarettes   Smokeless tobacco: Never  Vaping Use   Vaping Use: Former  Substance and Sexual Activity   Alcohol use: No   Drug use: No   Sexual activity: Not on file  Other Topics Concern   Not on file  Social History Narrative   ** Merged History Encounter **       Social Determinants of Health   Financial Resource Strain: Not on file  Food Insecurity: Not on file  Transportation Needs: Not on file  Physical Activity: Not on file  Stress: Not on file  Social Connections: Not on file    Allergies:  Allergies  Allergen Reactions   Ceclor [Cefaclor] Hives    Objective:    Vital Signs:   Temp:  [98 F (36.7 C)-98.6 F (37 C)] 98.3 F (36.8 C) (11/15 0336) Pulse Rate:  [109] 109 (11/14 1930) Resp:  [20] 20 (11/15 0400) BP: (99-138)/(70-102) 124/87 (11/15 0450) SpO2:  [93 %-98 %] 98 % (11/15 0336) Weight:  [88.5 kg-88.9 kg] 88.9 kg (11/15 0414) Last BM Date : 04/18/22  Weight change: Filed Weights   04/18/22 1512 04/19/22 1105 04/20/22 0414  Weight: 90.7 kg 88.5 kg 88.9 kg    Intake/Output:   Intake/Output Summary (Last 24 hours) at 04/20/2022 0934 Last data filed at 04/20/2022 0835 Gross per 24 hour  Intake 1870.54 ml  Output 3 ml  Net 1867.54 ml      Physical Exam    General:  Appears weak. Vomiting  HEENT:  normal Neck: supple. JVP 5-6 . Carotids 2+ bilat; no bruits. No lymphadenopathy or thyromegaly appreciated. Cor: PMI nondisplaced. Tachy Regular rate & rhythm. No rubs, gallops or murmurs. Lungs: clear Abdomen: soft, nontender, nondistended. No hepatosplenomegaly. No bruits or masses. Good bowel sounds. Extremities: no cyanosis, clubbing, rash, edema Neuro: alert & orientedx3, cranial nerves grossly intact. moves all 4 extremities w/o difficulty. Affect flat Telemetry   ST 120-130s   EKG    ST  132 bpm   Labs   Basic Metabolic Panel: Recent Labs  Lab 04/18/22 1535 04/19/22 0449 04/20/22 0415  NA 140 136 138  K 3.4* 3.3* 3.3*  CL 104 97* 105  CO2 22 20* 23  GLUCOSE 257* 336* 257*  BUN _0 CREATININE 0.61 0.72 0.68  CALCIUM 9.3 8.9 8.2*    Liver Function Tests: Recent Labs  Lab 04/18/22 1535  AST 21  ALT 20  ALKPHOS 74  BILITOT 0.9  PROT 8.1  ALBUMIN 4.3   Recent Labs  Lab 04/18/22 1535  LIPASE 39   No results for input(s): "AMMONIA" in the last 168 hours.  CBC: Recent Labs  Lab 04/18/22 1453 04/19/22 0449 04/20/22 0415  WBC 21.3* 26.2* 17.7*  NEUTROABS 18.8*  --   --   HGB 16.3* 16.0* 14.1  HCT 47.7* 47.0* 42.5  MCV 85.3 85.0 86.6  PLT 327 371 251    Cardiac Enzymes: No results for input(s): "CKTOTAL", "CKMB", "CKMBINDEX", "TROPONINI" in the last 168 hours.  BNP: BNP (last 3 results) Recent Labs    04/18/22 1537  BNP 136.4*    ProBNP (last 3 results) No results for input(s): "PROBNP" in the last 8760 hours.   CBG: Recent Labs  Lab 04/19/22 0828 04/19/22 1108 04/19/22 1844 04/19/22 2156 04/20/22 0607  GLUCAP 327* 255* 237* 230* 226*    Coagulation Studies: Recent Labs    04/19/22 0031  LABPROT 13.8  INR 1.1  Imaging   ECHOCARDIOGRAM COMPLETE  Result Date: 04/19/2022    ECHOCARDIOGRAM REPORT   Patient Name:   [name] Date of Exam: 04/19/2022 Medical Rec #:  [id number]         Height:       64.0 in  Accession #:    [id number]        Weight:       195.1 lb Date of Birth:  1976/05/28        BSA:          1.936 m Patient Age:    81 years          BP:           125/100 mmHg Patient Gender: F                 HR:           112 bpm. Exam Location:  Inpatient Procedure: 2D Echo, Cardiac Doppler, Color Doppler and Intracardiac            Opacification Agent Indications:     Acute Ischemic HD  History:         Patient has no prior history of Echocardiogram examinations.                  Risk Factors:Diabetes, Hypertension and Dyslipidemia.  Sonographer:     Wenda Low Referring Phys:  Harrison Diagnosing Phys: Dixie Dials MD IMPRESSIONS  1. Left ventricular ejection fraction, by estimation, is 25 to 30%. The left ventricle has severely decreased function. The left ventricle demonstrates global hypokinesis. The left ventricular internal cavity size was mildly dilated. Left ventricular diastolic parameters are consistent with Grade II diastolic dysfunction (pseudonormalization). There is severe hypokinesis of the left ventricular, mid anteroseptal wall. There is moderate hypokinesis of the left ventricular, mid anterolateral wall. There is mild hypokinesis of the left ventricular, entire inferolateral wall.  2. Right ventricular systolic function is normal. The right ventricular size is normal. There is mildly elevated pulmonary artery systolic pressure.  3. Left atrial size was moderately dilated.  4. Right atrial size was mildly dilated.  5. The mitral valve is normal in structure. Moderate mitral valve regurgitation.  6. The aortic valve is tricuspid. There is mild calcification of the aortic valve. Aortic valve regurgitation is not visualized. Aortic valve sclerosis is present, with no evidence of aortic valve stenosis.  7. The inferior vena cava is dilated in size with <50% respiratory variability, suggesting right atrial pressure of 15 mmHg. FINDINGS  Left Ventricle: Left ventricular ejection  fraction, by estimation, is 25 to 30%. The left ventricle has severely decreased function. The left ventricle demonstrates global hypokinesis. Severe hypokinesis of the left ventricular, mid anteroseptal wall. Moderate hypokinesis of the left ventricular, mid anterolateral wall. Mild hypokinesis of the left ventricular, entire inferolateral wall. Definity contrast agent was given IV to delineate the left ventricular endocardial borders. The left ventricular internal cavity size was mildly dilated. There is borderline concentric left ventricular hypertrophy. Left ventricular diastolic parameters are consistent with Grade II diastolic dysfunction (pseudonormalization).  LV Wall Scoring: The entire anterior wall, entire lateral wall, inferior wall, mid anteroseptal segment, and mid inferoseptal segment are hypokinetic. The basal anteroseptal segment, apical septal segment, apical inferior segment, basal inferoseptal segment, and apex are normal. Right Ventricle: The right ventricular size is normal. No increase in right ventricular wall thickness. Right ventricular systolic function is normal. There is mildly elevated pulmonary artery systolic pressure. The  tricuspid regurgitant velocity is 2.89  m/s, and with an assumed right atrial pressure of 8 mmHg, the estimated right ventricular systolic pressure is 56.3 mmHg. Left Atrium: Left atrial size was moderately dilated. Right Atrium: Right atrial size was mildly dilated. Pericardium: There is no evidence of pericardial effusion. Mitral Valve: The mitral valve is normal in structure. Moderate mitral valve regurgitation. MV peak gradient, 2.9 mmHg. The mean mitral valve gradient is 1.0 mmHg. Tricuspid Valve: The tricuspid valve is normal in structure. Tricuspid valve regurgitation is trivial. Aortic Valve: The aortic valve is tricuspid. There is mild calcification of the aortic valve. Aortic valve regurgitation is not visualized. Aortic valve sclerosis is present, with no  evidence of aortic valve stenosis. Aortic valve mean gradient measures 1.0 mmHg. Aortic valve peak gradient measures 2.4 mmHg. Aortic valve area, by VTI measures 3.46 cm. Pulmonic Valve: The pulmonic valve was normal in structure. Pulmonic valve regurgitation is trivial. Aorta: The aortic root is normal in size and structure. Venous: The inferior vena cava is dilated in size with less than 50% respiratory variability, suggesting right atrial pressure of 15 mmHg. IAS/Shunts: The atrial septum is grossly normal.  LEFT VENTRICLE PLAX 2D LVIDd:         5.00 cm   Diastology LVIDs:         4.50 cm   LV e' medial:    7.40 cm/s LV PW:         1.00 cm   LV E/e' medial:  11.8 LV IVS:        1.00 cm   LV e' lateral:   5.98 cm/s LVOT diam:     2.00 cm   LV E/e' lateral: 14.5 LV SV:         44 LV SV Index:   23 LVOT Area:     3.14 cm  RIGHT VENTRICLE RV Basal diam:  3.50 cm RV Mid diam:    2.90 cm RV S prime:     11.00 cm/s TAPSE (M-mode): 2.3 cm LEFT ATRIUM             Index        RIGHT ATRIUM           Index LA diam:        4.00 cm 2.07 cm/m   RA Area:     17.80 cm LA Vol (A2C):   64.3 ml 33.21 ml/m  RA Volume:   49.10 ml  25.36 ml/m LA Vol (A4C):   76.4 ml 39.47 ml/m LA Biplane Vol: 73.1 ml 37.76 ml/m  AORTIC VALVE                    PULMONIC VALVE AV Area (Vmax):    3.15 cm     PV Vmax:       0.71 m/s AV Area (Vmean):   2.90 cm     PV Peak grad:  2.0 mmHg AV Area (VTI):     3.46 cm AV Vmax:           77.60 cm/s AV Vmean:          55.700 cm/s AV VTI:            0.127 m AV Peak Grad:      2.4 mmHg AV Mean Grad:      1.0 mmHg LVOT Vmax:         77.70 cm/s LVOT Vmean:        51.500 cm/s LVOT VTI:  0.140 m LVOT/AV VTI ratio: 1.10  AORTA Ao Root diam: 3.30 cm MITRAL VALVE               TRICUSPID VALVE MV Area (PHT): 5.66 cm    TR Peak grad:   33.4 mmHg MV Area VTI:   2.78 cm    TR Vmax:        289.00 cm/s MV Peak grad:  2.9 mmHg MV Mean grad:  1.0 mmHg    SHUNTS MV Vmax:       0.86 m/s    Systemic VTI:  0.14  m MV Vmean:      47.1 cm/s   Systemic Diam: 2.00 cm MV Decel Time: 134 msec MV E velocity: 87.00 cm/s MV A velocity: 44.60 cm/s MV E/A ratio:  1.95 Dixie Dials MD Electronically signed by Dixie Dials MD Signature Date/Time: 04/19/2022/5:36:58 PM    Final      Medications:     Current Medications:  aspirin EC  81 mg Oral Daily   atorvastatin  80 mg Oral QHS   Chlorhexidine Gluconate Cloth  6 each Topical Q0600   dapagliflozin propanediol  10 mg Oral Daily   escitalopram  20 mg Oral Daily   gabapentin  300 mg Oral QID   insulin aspart  0-20 Units Subcutaneous TID WC   insulin aspart  4 Units Subcutaneous TID WC   insulin glargine-yfgn  10 Units Subcutaneous BID   metoprolol tartrate  12.5 mg Oral BID   mupirocin ointment  1 Application Nasal BID   pantoprazole (PROTONIX) IV  40 mg Intravenous Q24H   potassium chloride  20 mEq Oral BID   sodium chloride flush  10-40 mL Intracatheter Q12H   sodium chloride flush  3 mL Intravenous Q12H    Infusions:  sodium chloride     sodium chloride 20 mL/hr at 04/19/22 1800   azithromycin 500 mg (04/19/22 2014)   heparin 1,150 Units/hr (04/20/22 0835)   nitroGLYCERIN 20 mcg/min (04/20/22 0714)   promethazine (PHENERGAN) injection (IM or IVPB) 12.5 mg (04/20/22 0114)      Patient Profile   [name] is a 46 year old with history of DMII, anxiety, HTN, obesity, Chrons Disease, and tobacco abuse. Started ozempic 2 months ago  Admitted with persistent nausea/vomiting      Assessment/Plan   1. Abdominal Pain, Nausea, Vomiting  H/O Chrons Disease + Diabetes. Grandchild with recent GI virus - Check GI panel.  -  CT abd negative.  - Check lactic acid.  - ? Gastroparesis - Consult GI  - Continue PPI.   2. NSTEMI  -HS Trop 203>559>741 - EKG no ischemic c  -Risk factors coronary disease. Tobacco abuse, hyperlipidemia, and diabetes.  -Eventual cath with newly reduced EF. Concerned she is at risk for aspiration with persistent nausea  and vomiting.   3. Acute HFrEF -Newly diagnosed. No previous cardiac history. Unclear etiology. Eventual cath to assess coronaries.  -Echo EF 25-30% , RV normal  - Difficult to assess volume status with nausea/vomiting.  - Hold lasix   - Hold lopressor -Once nausea/vomiting better will start GDMT  4. DMII  Hgb A1C 9.5 Continue SSI   5. Hyperlipidemia  LPA 193  On high intensity statin on admit May need to consider PSK9i  6. Sinus Tach  N/V +/- low output heart failure ?  7. Headache  -Developed headache after starting NTG.  -CT Head negative      Length of Stay: [place], NP  04/20/2022, 9:34 AM  Advanced Heart Failure Team Pager [phone] (M-F; 7a - 5p)  Please contact [facility] for night-coverage after hours (4p -7a ) and weekends on [email or web address]

## 2022-04-20 NOTE — Plan of Care (Signed)
  Problem: Education: Goal: Understanding of cardiac disease, CV risk reduction, and recovery process will improve Outcome: Progressing Goal: Individualized Educational Video(s) Outcome: Progressing   Problem: Activity: Goal: Ability to tolerate increased activity will improve Outcome: Progressing   Problem: Cardiac: Goal: Ability to achieve and maintain adequate cardiovascular perfusion will improve Outcome: Progressing   Problem: Health Behavior/Discharge Planning: Goal: Ability to safely manage health-related needs after discharge will improve Outcome: Progressing   Problem: Education: Goal: Understanding of CV disease, CV risk reduction, and recovery process will improve Outcome: Progressing Goal: Individualized Educational Video(s) Outcome: Progressing   Problem: Activity: Goal: Ability to return to baseline activity level will improve Outcome: Progressing   Problem: Cardiovascular: Goal: Ability to achieve and maintain adequate cardiovascular perfusion will improve Outcome: Progressing Goal: Vascular access site(s) Level 0-1 will be maintained Outcome: Progressing   Problem: Health Behavior/Discharge Planning: Goal: Ability to safely manage health-related needs after discharge will improve Outcome: Progressing   Problem: Education: Goal: Ability to describe self-care measures that may prevent or decrease complications (Diabetes Survival Skills Education) will improve Outcome: Progressing Goal: Individualized Educational Video(s) Outcome: Progressing   Problem: Coping: Goal: Ability to adjust to condition or change in health will improve Outcome: Progressing   Problem: Fluid Volume: Goal: Ability to maintain a balanced intake and output will improve Outcome: Progressing   Problem: Health Behavior/Discharge Planning: Goal: Ability to identify and utilize available resources and services will improve Outcome: Progressing Goal: Ability to manage health-related  needs will improve Outcome: Progressing   Problem: Metabolic: Goal: Ability to maintain appropriate glucose levels will improve Outcome: Progressing   Problem: Nutritional: Goal: Maintenance of adequate nutrition will improve Outcome: Progressing Goal: Progress toward achieving an optimal weight will improve Outcome: Progressing   Problem: Skin Integrity: Goal: Risk for impaired skin integrity will decrease Outcome: Progressing   Problem: Tissue Perfusion: Goal: Adequacy of tissue perfusion will improve Outcome: Progressing   Problem: Education: Goal: Knowledge of General Education information will improve Description: Including pain rating scale, medication(s)/side effects and non-pharmacologic comfort measures Outcome: Progressing   Problem: Health Behavior/Discharge Planning: Goal: Ability to manage health-related needs will improve Outcome: Progressing   Problem: Clinical Measurements: Goal: Ability to maintain clinical measurements within normal limits will improve Outcome: Progressing Goal: Will remain free from infection Outcome: Progressing Goal: Diagnostic test results will improve Outcome: Progressing Goal: Respiratory complications will improve Outcome: Progressing Goal: Cardiovascular complication will be avoided Outcome: Progressing   Problem: Activity: Goal: Risk for activity intolerance will decrease Outcome: Progressing   Problem: Nutrition: Goal: Adequate nutrition will be maintained Outcome: Progressing   Problem: Coping: Goal: Level of anxiety will decrease Outcome: Progressing   Problem: Elimination: Goal: Will not experience complications related to bowel motility Outcome: Progressing Goal: Will not experience complications related to urinary retention Outcome: Progressing   Problem: Pain Managment: Goal: General experience of comfort will improve Outcome: Progressing   Problem: Safety: Goal: Ability to remain free from injury will  improve Outcome: Progressing   Problem: Skin Integrity: Goal: Risk for impaired skin integrity will decrease Outcome: Progressing   

## 2022-04-20 NOTE — Progress Notes (Signed)
Ref: [facility]   Subjective:  Poor LV systolic function. Suspect recurrent insult to myocardium over few years. Sinus tachycardia continues. Dry heaves continue. BP under control today. Appreciate heart failure team consult. IV potassium 2 runs in.  Objective:  Vital Signs in the last 24 hours: Temp:  [98 F (36.7 C)-99 F (37.2 C)] 99 F (37.2 C) (11/15 1108) Pulse Rate:  [109-127] 127 (11/15 1134) Cardiac Rhythm: Sinus tachycardia (11/15 0700) Resp:  [0-20] 0 (11/15 1108) BP: (99-138)/(70-102) 115/81 (11/15 1108) SpO2:  [93 %-98 %] 98 % (11/15 0336) Weight:  [88.9 kg] 88.9 kg (11/15 0414)  Physical Exam: BP Readings from Last 1 Encounters:  04/20/22 115/81     Wt Readings from Last 1 Encounters:  04/20/22 88.9 kg    Weight change: -2.22 kg Body mass index is 33.64 kg/m. HEENT: [place]/AT, Eyes-Brown, Conjunctiva-Pink, Sclera-Non-icteric Neck: No JVD, No bruit, Trachea midline. Lungs:  Clear, Bilateral. Cardiac:  Tachycardic, Regular rhythm, normal S1 and S2, no S3. II/VI systolic murmur. Abdomen:  Soft, non-tender. BS present. Extremities:  No edema present. No cyanosis. No clubbing. CNS: AxOx3, Cranial nerves grossly intact, moves all 4 extremities.  Skin: Warm and dry.   Intake/Output from previous day: 11/14 0701 - 11/15 0700 In: 1686.9 [P.O.:780; I.V.:906.9] Out: 3 [Urine:3]    Lab Results: BMET    Component Value Date/Time   NA 138 04/20/2022 0415   NA 136 04/19/2022 0449   NA 140 04/18/2022 1535   K 3.3 (L) 04/20/2022 0415   K 3.3 (L) 04/19/2022 0449   K 3.4 (L) 04/18/2022 1535   CL 105 04/20/2022 0415   CL 97 (L) 04/19/2022 0449   CL 104 04/18/2022 1535   CO2 23 04/20/2022 0415   CO2 20 (L) 04/19/2022 0449   CO2 22 04/18/2022 1535   GLUCOSE 257 (H) 04/20/2022 0415   GLUCOSE 336 (H) 04/19/2022 0449   GLUCOSE 257 (H) 04/18/2022 1535   BUN 13 04/20/2022 0415   BUN 10 04/19/2022 0449   BUN 10 04/18/2022 1535   CREATININE 0.68  04/20/2022 0415   CREATININE 0.72 04/19/2022 0449   CREATININE 0.61 04/18/2022 1535   CALCIUM 8.2 (L) 04/20/2022 0415   CALCIUM 8.9 04/19/2022 0449   CALCIUM 9.3 04/18/2022 1535   GFRNONAA >60 04/20/2022 0415   GFRNONAA >60 04/19/2022 0449   GFRNONAA >60 04/18/2022 1535   GFRAA >60 11/16/2016 0818   GFRAA >60 01/02/2016 1354   GFRAA  08/04/2010 0345    >60        The eGFR has been calculated using the MDRD equation. This calculation has not been validated in all clinical situations. eGFR's persistently <60 mL/min signify possible Chronic Kidney Disease.   CBC    Component Value Date/Time   WBC 17.7 (H) 04/20/2022 0415   RBC 4.91 04/20/2022 0415   HGB 14.1 04/20/2022 0415   HCT 42.5 04/20/2022 0415   PLT 251 04/20/2022 0415   MCV 86.6 04/20/2022 0415   MCH 28.7 04/20/2022 0415   MCHC 33.2 04/20/2022 0415   RDW 13.8 04/20/2022 0415   LYMPHSABS 1.4 04/18/2022 1453   MONOABS 1.0 04/18/2022 1453   EOSABS 0.0 04/18/2022 1453   BASOSABS 0.1 04/18/2022 1453   HEPATIC Function Panel Recent Labs    04/18/22 1535  PROT 8.1  ALBUMIN 4.3  AST 21  ALT 20  ALKPHOS 74   HEMOGLOBIN A1C Lab Results  Component Value Date   MPG 225.95 04/20/2022   CARDIAC ENZYMES  No results found for: "CKTOTAL", "CKMB", "CKMBINDEX", "TROPONINI" BNP No results for input(s): "PROBNP" in the last 8760 hours. TSH No results for input(s): "TSH" in the last 8760 hours. CHOLESTEROL Recent Labs    04/19/22 0449  CHOL 305*    Scheduled Meds:  amoxicillin  500 mg Oral Q12H   aspirin EC  81 mg Oral Daily   atorvastatin  80 mg Oral QHS   Chlorhexidine Gluconate Cloth  6 each Topical Q0600   digoxin  0.125 mg Oral Daily   escitalopram  20 mg Oral Daily   gabapentin  300 mg Oral QID   heparin  2,000 Units Intravenous Once   insulin aspart  0-20 Units Subcutaneous TID WC   insulin aspart  4 Units Subcutaneous TID WC   insulin glargine-yfgn  20 Units Subcutaneous BID   metoprolol tartrate   12.5 mg Oral BID   mupirocin ointment  1 Application Nasal BID   pantoprazole (PROTONIX) IV  40 mg Intravenous Q24H   potassium chloride  20 mEq Oral BID   sodium chloride flush  10-40 mL Intracatheter Q12H   sodium chloride flush  3 mL Intravenous Q12H   Continuous Infusions:  sodium chloride     sodium chloride 20 mL/hr at 04/19/22 1800   heparin 1,150 Units/hr (04/20/22 0835)   nitroGLYCERIN 20 mcg/min (04/20/22 0714)   promethazine (PHENERGAN) injection (IM or IVPB) 12.5 mg (04/20/22 1132)   PRN Meds:.sodium chloride, acetaminophen, metoCLOPramide (REGLAN) injection, morphine injection, nitroGLYCERIN, ondansetron (ZOFRAN) IV, mouth rinse, promethazine (PHENERGAN) injection (IM or IVPB), promethazine, sodium chloride flush, sodium chloride flush, zolpidem  Assessment/Plan: Acute coronary syndrome Acute on chronic systolic left heart failure, HFrEF Acute Cardiogenic shock Acute Pulmonary edema due to above Type 2 DM with Hyperglycemia Obesity Tobacco use disorder  Plan: R + L heart catheterization. Patient and family understood she is high risk for procedure and wants to proceed.   LOS: 1 day   Time spent including chart review, lab review, examination, discussion with patient/Family/Nurse/HF Team : 40 min   Dixie Dials  MD  04/20/2022, 11:44 AM

## 2022-04-20 NOTE — TOC Benefit Eligibility Note (Signed)
Patient Product/process development scientist completed.    The patient is currently admitted and upon discharge could be taking Jardiance 10 mg.  The current 30 day co-pay is $4.00.   The patient is currently admitted and upon discharge could be taking Entresto 24-26 mg.  Requires Prior Authorization  The patient is insured through [facility]     Roland Earl, CPhT Pharmacy Patient Advocate Specialist [facility] Patient Advocate Team Direct Number: [phone]  Fax: [phone]

## 2022-04-20 NOTE — Progress Notes (Signed)
ANTICOAGULATION CONSULT NOTE  Pharmacy Consult for heparin Indication: chest pain/ACS  Allergies  Allergen Reactions   Ceclor [Cefaclor] Hives    Patient Measurements: Height: 5\' 4"  (162.6 cm) Weight: 88.9 kg (195 lb 15.8 oz) IBW/kg (Calculated) : 54.7 Heparin Dosing Weight: 75kg  Vital Signs: Temp: 98.3 F (36.8 C) (11/15 0336) Temp Source: Oral (11/15 0336) BP: 124/87 (11/15 0450)  Labs: Recent Labs    04/18/22 1453 04/18/22 1527 04/18/22 1535 04/18/22 2225 04/19/22 0031 04/19/22 0449 04/19/22 1117 04/19/22 1350 04/20/22 0415 04/20/22 0650 04/20/22 0742  HGB 16.3*  --   --   --   --  16.0*  --   --  14.1  --   --   HCT 47.7*  --   --   --   --  47.0*  --   --  42.5  --   --   PLT 327  --   --   --   --  371  --   --  251  --   --   APTT  --   --   --   --  24  --   --   --   --   --   --   LABPROT  --   --   --   --  13.8  --   --   --   --   --   --   INR  --   --   --   --  1.1  --   --   --   --   --   --   HEPARINUNFRC  --   --   --   --   --   --  <0.10* <0.10*  --  <0.10*  --   CREATININE  --   --  0.61  --   --  0.72  --   --  0.68  --   --   TROPONINIHS  --  344*  --  713*  --   --   --   --   --   --  410*     Estimated Creatinine Clearance: 95.9 mL/min (by C-G formula based on SCr of 0.68 mg/dL).   Medical History: Past Medical History:  Diagnosis Date   Anxiety    Crohn's disease (HCC)    Diabetes mellitus    DKA (diabetic ketoacidosis) (HCC)    Hypertension    Insomnia    Neuropathy     Assessment: 46yo female with medical history of insulin-dependent DM, crohn disease, anxiety, daily cannabis use presents to the ED with complaint of abd pain, N/V, diarrhea.  Pharmacy consulted to dose heparin drip for ACS/STEMI.  No prior AC noted  Heparin level came back undetectable, on heparin infusion at 1150 units/hr. Hgb 14.1, plt 251. LDH 410. No s/sx of bleeding or infusion issues.   Goal of Therapy:  Heparin level 0.3-0.7 units/ml Monitor  platelets by anticoagulation protocol: Yes   Plan:  Heparin bolus 2000 units x 1 Increase heparin drip 1300 units/hr Heparin level in 6 hours Daily CBC  46yo, PharmD, BCCCP Clinical Pharmacist  Phone: [phone] 04/20/2022 9:35 AM  Please check AMION for all [facility] phone numbers After 10:00 PM, call Main Pharmacy [phone]

## 2022-04-20 NOTE — Progress Notes (Signed)
MD called. Verbal order to make patient NPO received. Informed him that patient is still c/o nausea and vomiting. Verbal order to give next dose of Protonix now and order head ct without contrast and for 2 bag of IV potassium. Information relayed to patient. Patient states she is not going to the cath lab if she continues to be nauseas and vomit.    0645- Pt c/o of 8/10 chest pain with radiation down arm. VS obtained. EKG obtained. Placed on 2L 02 via [place]. Nitro gtt titrated. MD notified. Verbal order for stat troponin. Obtained and sent to lab.   Pt currently CP free.

## 2022-04-20 NOTE — Plan of Care (Signed)
  Problem: Education: Goal: Understanding of cardiac disease, CV risk reduction, and recovery process will improve Outcome: Progressing Goal: Individualized Educational Video(s) Outcome: Progressing   Problem: Activity: Goal: Ability to tolerate increased activity will improve Outcome: Progressing   Problem: Cardiac: Goal: Ability to achieve and maintain adequate cardiovascular perfusion will improve Outcome: Progressing   Problem: Education: Goal: Understanding of CV disease, CV risk reduction, and recovery process will improve Outcome: Progressing

## 2022-04-21 ENCOUNTER — Encounter (HOSPITAL_COMMUNITY)
Admission: EM | Disposition: A | Discharge status: Home / Self Care | Payer: Self-pay | Source: Home / Self Care | Attending: Cardiovascular Disease

## 2022-04-21 HISTORY — PX: RIGHT/LEFT HEART CATH AND CORONARY ANGIOGRAPHY: CATH118266

## 2022-04-21 LAB — CBC
HCT: 41.9 % (ref 36.0–46.0)
Hemoglobin: 13.9 g/dL (ref 12.0–15.0)
MCH: 28.9 pg (ref 26.0–34.0)
MCHC: 33.2 g/dL (ref 30.0–36.0)
MCV: 87.1 fL (ref 80.0–100.0)
Platelets: 271 10*3/uL (ref 150–400)
RBC: 4.81 MIL/uL (ref 3.87–5.11)
RDW: 13.6 % (ref 11.5–15.5)
WBC: 12.4 10*3/uL — ABNORMAL HIGH (ref 4.0–10.5)
nRBC: 0 % (ref 0.0–0.2)

## 2022-04-21 LAB — POCT I-STAT 7, (LYTES, BLD GAS, ICA,H+H)
Acid-Base Excess: 1 mmol/L (ref 0.0–2.0)
Bicarbonate: 24.9 mmol/L (ref 20.0–28.0)
Calcium, Ion: 1.16 mmol/L (ref 1.15–1.40)
HCT: 36 % (ref 36.0–46.0)
Hemoglobin: 12.2 g/dL (ref 12.0–15.0)
O2 Saturation: 93 %
Potassium: 3.2 mmol/L — ABNORMAL LOW (ref 3.5–5.1)
Sodium: 139 mmol/L (ref 135–145)
TCO2: 26 mmol/L (ref 22–32)
pCO2 arterial: 36 mmHg (ref 32–48)
pH, Arterial: 7.448 (ref 7.35–7.45)
pO2, Arterial: 62 mmHg — ABNORMAL LOW (ref 83–108)

## 2022-04-21 LAB — POCT I-STAT EG7
Acid-Base Excess: 1 mmol/L (ref 0.0–2.0)
Bicarbonate: 25.9 mmol/L (ref 20.0–28.0)
Calcium, Ion: 1.17 mmol/L (ref 1.15–1.40)
HCT: 36 % (ref 36.0–46.0)
Hemoglobin: 12.2 g/dL (ref 12.0–15.0)
O2 Saturation: 67 %
Potassium: 3.2 mmol/L — ABNORMAL LOW (ref 3.5–5.1)
Sodium: 139 mmol/L (ref 135–145)
TCO2: 27 mmol/L (ref 22–32)
pCO2, Ven: 39.4 mmHg — ABNORMAL LOW (ref 44–60)
pH, Ven: 7.426 (ref 7.25–7.43)
pO2, Ven: 34 mmHg (ref 32–45)

## 2022-04-21 LAB — GLUCOSE, CAPILLARY
Glucose-Capillary: 214 mg/dL — ABNORMAL HIGH (ref 70–99)
Glucose-Capillary: 146 mg/dL — ABNORMAL HIGH (ref 70–99)
Glucose-Capillary: 146 mg/dL — ABNORMAL HIGH (ref 70–99)
Glucose-Capillary: 151 mg/dL — ABNORMAL HIGH (ref 70–99)

## 2022-04-21 LAB — POCT ACTIVATED CLOTTING TIME: Activated Clotting Time: 119 seconds

## 2022-04-21 LAB — BASIC METABOLIC PANEL
Anion gap: 10 (ref 5–15)
BUN: 14 mg/dL (ref 6–20)
CO2: 25 mmol/L (ref 22–32)
Calcium: 8.3 mg/dL — ABNORMAL LOW (ref 8.9–10.3)
Chloride: 104 mmol/L (ref 98–111)
Creatinine, Ser: 0.76 mg/dL (ref 0.44–1.00)
GFR, Estimated: >60 mL/min (ref >60)
Glucose, Bld: 142 mg/dL — ABNORMAL HIGH (ref 70–99)
Potassium: 3.2 mmol/L — ABNORMAL LOW (ref 3.5–5.1)
Sodium: 139 mmol/L (ref 135–145)

## 2022-04-21 LAB — HEPARIN LEVEL (UNFRACTIONATED): Heparin Unfractionated: <0.1 IU/mL — ABNORMAL LOW (ref 0.30–0.70)

## 2022-04-21 LAB — TROPONIN I (HIGH SENSITIVITY): Troponin I (High Sensitivity): 142 ng/L (ref <18)

## 2022-04-21 SURGERY — RIGHT/LEFT HEART CATH AND CORONARY ANGIOGRAPHY
Anesthesia: LOCAL

## 2022-04-21 SURGERY — ESOPHAGOGASTRODUODENOSCOPY (EGD) WITH PROPOFOL
Anesthesia: Monitor Anesthesia Care

## 2022-04-21 MED ORDER — LIDOCAINE HCL (PF) 1 % IJ SOLN
INTRAMUSCULAR | Status: DC | PRN
  Administered 2022-04-21: 20 mL

## 2022-04-21 MED ORDER — FENTANYL CITRATE (PF) 100 MCG/2ML IJ SOLN
INTRAMUSCULAR | Status: AC
  Filled 2022-04-21: qty 2

## 2022-04-21 MED ORDER — LIDOCAINE HCL (PF) 1 % IJ SOLN
INTRAMUSCULAR | Status: AC
  Filled 2022-04-21: qty 30

## 2022-04-21 MED ORDER — ONDANSETRON HCL 4 MG/2ML IJ SOLN
4.0000 mg | Freq: Four times a day (QID) | INTRAMUSCULAR | Status: DC | PRN
  Administered 2022-04-21 – 2022-04-22 (×3): 4 mg via INTRAVENOUS
  Filled 2022-04-21 (×3): qty 2

## 2022-04-21 MED ORDER — ORAL CARE MOUTH RINSE: 15.0000 mL | OROMUCOSAL | Status: DC | PRN

## 2022-04-21 MED ORDER — SODIUM CHLORIDE 0.9 % IV SOLN: INTRAVENOUS | Status: DC

## 2022-04-21 MED ORDER — MIDAZOLAM HCL 2 MG/2ML IJ SOLN
INTRAMUSCULAR | Status: DC | PRN
  Administered 2022-04-21 (×2): 1 mg via INTRAVENOUS

## 2022-04-21 MED ORDER — HEPARIN BOLUS VIA INFUSION
2000.0000 [IU] | Freq: Once | INTRAVENOUS | Status: AC
  Administered 2022-04-21: 2000 [IU] via INTRAVENOUS
  Filled 2022-04-21: qty 2000

## 2022-04-21 MED ORDER — LORAZEPAM 2 MG/ML IJ SOLN
0.5000 mg | Freq: Once | INTRAMUSCULAR | Status: AC
  Administered 2022-04-21: 0.5 mg via INTRAVENOUS
  Filled 2022-04-21: qty 1

## 2022-04-21 MED ORDER — HEPARIN (PORCINE) IN NACL 1000-0.9 UT/500ML-% IV SOLN
INTRAVENOUS | Status: AC
  Filled 2022-04-21: qty 1000

## 2022-04-21 MED ORDER — HEPARIN SODIUM (PORCINE) 5000 UNIT/ML IJ SOLN
5000.0000 [IU] | Freq: Three times a day (TID) | INTRAMUSCULAR | Status: DC
  Administered 2022-04-21 – 2022-04-22 (×3): 5000 [IU] via SUBCUTANEOUS
  Filled 2022-04-21 (×3): qty 1

## 2022-04-21 MED ORDER — SODIUM CHLORIDE 0.9% FLUSH
3.0000 mL | Freq: Two times a day (BID) | INTRAVENOUS | Status: DC
  Administered 2022-04-21 – 2022-04-22 (×3): 3 mL via INTRAVENOUS

## 2022-04-21 MED ORDER — HEPARIN (PORCINE) IN NACL 1000-0.9 UT/500ML-% IV SOLN
INTRAVENOUS | Status: DC | PRN
  Administered 2022-04-21 (×2): 500 mL

## 2022-04-21 MED ORDER — IOHEXOL 350 MG/ML SOLN
INTRAVENOUS | Status: DC | PRN
  Administered 2022-04-21: 35 mL

## 2022-04-21 MED ORDER — SODIUM CHLORIDE 0.9% FLUSH: 3.0000 mL | INTRAVENOUS | Status: DC | PRN

## 2022-04-21 MED ORDER — HYDRALAZINE HCL 20 MG/ML IJ SOLN: 10.0000 mg | INTRAMUSCULAR | Status: AC | PRN

## 2022-04-21 MED ORDER — CLOPIDOGREL BISULFATE 75 MG PO TABS
75.0000 mg | ORAL_TABLET | Freq: Every day | ORAL | Status: DC
  Administered 2022-04-22: 75 mg via ORAL
  Filled 2022-04-21: qty 1

## 2022-04-21 MED ORDER — SODIUM CHLORIDE 0.9 % IV SOLN: 250.0000 mL | INTRAVENOUS | Status: DC | PRN

## 2022-04-21 MED ORDER — FENTANYL CITRATE (PF) 100 MCG/2ML IJ SOLN
INTRAMUSCULAR | Status: DC | PRN
  Administered 2022-04-21 (×2): 25 ug via INTRAVENOUS

## 2022-04-21 MED ORDER — MIDAZOLAM HCL 2 MG/2ML IJ SOLN
INTRAMUSCULAR | Status: AC
  Filled 2022-04-21: qty 2

## 2022-04-21 MED ORDER — LABETALOL HCL 5 MG/ML IV SOLN: 10.0000 mg | INTRAVENOUS | Status: AC | PRN

## 2022-04-21 MED ORDER — LORAZEPAM 2 MG/ML IJ SOLN
0.5000 mg | Freq: Four times a day (QID) | INTRAMUSCULAR | Status: DC | PRN
  Administered 2022-04-22 (×2): 0.5 mg via INTRAVENOUS
  Filled 2022-04-21 (×2): qty 1

## 2022-04-21 SURGICAL SUPPLY — 11 items
CATH INFINITI 5FR MULTPACK ANG (CATHETERS) IMPLANT
CATH SWAN GANZ 7F STRAIGHT (CATHETERS) IMPLANT
KIT HEART LEFT (KITS) ×1 IMPLANT
PACK CARDIAC CATHETERIZATION (CUSTOM PROCEDURE TRAY) ×1 IMPLANT
SHEATH PINNACLE 5F 10CM (SHEATH) IMPLANT
SHEATH PINNACLE 7F 10CM (SHEATH) IMPLANT
TRANSDUCER W/STOPCOCK (MISCELLANEOUS) ×1 IMPLANT
TUBING ART PRESS 72  MALE/FEM (TUBING) ×1
TUBING ART PRESS 72 MALE/FEM (TUBING) IMPLANT
TUBING CIL FLEX 10 FLL-RA (TUBING) IMPLANT
WIRE EMERALD 3MM-J .035X150CM (WIRE) IMPLANT

## 2022-04-21 NOTE — Progress Notes (Signed)
Ref: [facility]   Subjective:  Awake. Had good rest with lorazepam use. VS stable. Mild tachycardia persist. Agrees to cardiac catheterization.  Objective:  Vital Signs in the last 24 hours: Temp:  [97.8 F (36.6 C)-99 F (37.2 C)] 98.8 F (37.1 C) (11/16 0729) Pulse Rate:  [98-118] 98 (11/16 0729) Cardiac Rhythm: Sinus tachycardia (11/16 0700) Resp:  [9-27] 19 (11/16 0729) BP: (104-135)/(56-81) 104/75 (11/16 0729) SpO2:  [92 %-96 %] 94 % (11/16 0346)  Physical Exam: BP Readings from Last 1 Encounters:  04/21/22 104/75     Wt Readings from Last 1 Encounters:  04/20/22 88.9 kg    Weight change:  Body mass index is 33.64 kg/m. HEENT: [place]/AT, Eyes-Brown, Conjunctiva-Pink, Sclera-Non-icteric Neck: No JVD, No bruit, Trachea midline. Lungs:  Clearing, Bilateral. Cardiac:  Tachycardic, Regular rhythm, normal S1 and S2, no S3. II/VI systolic murmur. Abdomen:  Soft, non-tender. BS present. Extremities:  No edema present. No cyanosis. No clubbing. CNS: AxOx3, Cranial nerves grossly intact, moves all 4 extremities.  Skin: Warm and dry.   Intake/Output from previous day: 11/15 0701 - 11/16 0700 In: 1716.3 [I.V.:1367.4; IV Piggyback:348.8] Out: -     Lab Results: BMET    Component Value Date/Time   NA 138 04/20/2022 0415   NA 136 04/19/2022 0449   NA 140 04/18/2022 1535   K 3.3 (L) 04/20/2022 0415   K 3.3 (L) 04/19/2022 0449   K 3.4 (L) 04/18/2022 1535   CL 105 04/20/2022 0415   CL 97 (L) 04/19/2022 0449   CL 104 04/18/2022 1535   CO2 23 04/20/2022 0415   CO2 20 (L) 04/19/2022 0449   CO2 22 04/18/2022 1535   GLUCOSE 257 (H) 04/20/2022 0415   GLUCOSE 336 (H) 04/19/2022 0449   GLUCOSE 257 (H) 04/18/2022 1535   BUN 13 04/20/2022 0415   BUN 10 04/19/2022 0449   BUN 10 04/18/2022 1535   CREATININE 0.68 04/20/2022 0415   CREATININE 0.72 04/19/2022 0449   CREATININE 0.61 04/18/2022 1535   CALCIUM 8.2 (L) 04/20/2022 0415   CALCIUM 8.9 04/19/2022  0449   CALCIUM 9.3 04/18/2022 1535   GFRNONAA >60 04/20/2022 0415   GFRNONAA >60 04/19/2022 0449   GFRNONAA >60 04/18/2022 1535   GFRAA >60 11/16/2016 0818   GFRAA >60 01/02/2016 1354   GFRAA  08/04/2010 0345    >60        The eGFR has been calculated using the MDRD equation. This calculation has not been validated in all clinical situations. eGFR's persistently <60 mL/min signify possible Chronic Kidney Disease.   CBC    Component Value Date/Time   WBC 12.4 (H) 04/21/2022 0500   RBC 4.81 04/21/2022 0500   HGB 13.9 04/21/2022 0500   HCT 41.9 04/21/2022 0500   PLT 271 04/21/2022 0500   MCV 87.1 04/21/2022 0500   MCH 28.9 04/21/2022 0500   MCHC 33.2 04/21/2022 0500   RDW 13.6 04/21/2022 0500   LYMPHSABS 1.4 04/18/2022 1453   MONOABS 1.0 04/18/2022 1453   EOSABS 0.0 04/18/2022 1453   BASOSABS 0.1 04/18/2022 1453   HEPATIC Function Panel Recent Labs    04/18/22 1535 04/20/22 1142  PROT 8.1 6.4*  ALBUMIN 4.3 3.3*  AST 21 18  ALT 20 15  ALKPHOS 74 64  BILIDIR  --  0.1  IBILI  --  0.6   HEMOGLOBIN A1C Lab Results  Component Value Date   MPG 225.95 04/20/2022   CARDIAC ENZYMES No results found for: "CKTOTAL", "  CKMB", "CKMBINDEX", "TROPONINI" BNP No results for input(s): "PROBNP" in the last 8760 hours. TSH No results for input(s): "TSH" in the last 8760 hours. CHOLESTEROL Recent Labs    04/19/22 0449  CHOL 305*    Scheduled Meds:  amoxicillin  500 mg Oral Q12H   aspirin EC  81 mg Oral Daily   atorvastatin  80 mg Oral QHS   Chlorhexidine Gluconate Cloth  6 each Topical Q0600   digoxin  0.125 mg Oral Daily   escitalopram  20 mg Oral Daily   gabapentin  300 mg Oral QID   insulin aspart  0-20 Units Subcutaneous TID WC   insulin aspart  4 Units Subcutaneous TID WC   insulin glargine-yfgn  14 Units Subcutaneous BID   LORazepam  0.5 mg Oral BID   metoCLOPramide (REGLAN) injection  10 mg Intravenous Q6H   mupirocin ointment  1 Application Nasal BID    ondansetron  4 mg Intravenous Q8H   pantoprazole (PROTONIX) IV  40 mg Intravenous Q24H   potassium chloride SA  20 mEq Oral BID   sodium chloride flush  10-40 mL Intracatheter Q12H   sodium chloride flush  3 mL Intravenous Q12H   Continuous Infusions:  sodium chloride     sodium chloride 20 mL/hr at 04/19/22 1800   heparin 1,650 Units/hr (04/21/22 0753)   nitroGLYCERIN 20 mcg/min (04/20/22 0714)   promethazine (PHENERGAN) injection (IM or IVPB) 12.5 mg (04/21/22 0551)   PRN Meds:.sodium chloride, acetaminophen, morphine injection, nitroGLYCERIN, mouth rinse, promethazine (PHENERGAN) injection (IM or IVPB), promethazine, sodium chloride flush, sodium chloride flush, zolpidem  Assessment/Plan: Acute coronary syndrome 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   LOS: 2 days   Time spent including chart review, lab review, examination, discussion with patient/Nurse/Family : 30 min   Dixie Dials  MD  04/21/2022, 10:55 AM

## 2022-04-21 NOTE — Progress Notes (Signed)
ANTICOAGULATION CONSULT NOTE Pharmacy Consult for heparin Indication: chest pain/ACS Brief A/P: Heparin level subtherapeutic Increase Heparin rate  Allergies  Allergen Reactions   Ceclor [Cefaclor] Hives    Patient Measurements: Height: 5\' 4"  (162.6 cm) Weight: 88.9 kg (195 lb 15.8 oz) IBW/kg (Calculated) : 54.7 Heparin Dosing Weight: 75kg  Vital Signs: Temp: 98.4 F (36.9 C) (11/16 0346) Temp Source: Oral (11/16 0346) BP: 131/74 (11/16 0346) Pulse Rate: 104 (11/15 2338)  Labs: Recent Labs    04/18/22 1535 04/18/22 2225 04/19/22 0031 04/19/22 0449 04/19/22 1117 04/20/22 0415 04/20/22 0650 04/20/22 0742 04/20/22 1142 04/20/22 2219 04/21/22 0500  HGB  --   --   --  16.0*  --  14.1  --   --   --   --  13.9  HCT  --   --   --  47.0*  --  42.5  --   --   --   --  41.9  PLT  --   --   --  371  --  251  --   --   --   --  271  APTT  --   --  24  --   --   --   --   --   --   --   --   LABPROT  --   --  13.8  --   --   --   --   --   --   --   --   INR  --   --  1.1  --   --   --   --   --   --   --   --   HEPARINUNFRC  --   --   --   --    < >  --  <0.10*  --   --  <0.10* <0.10*  CREATININE 0.61  --   --  0.72  --  0.68  --   --   --   --   --   TROPONINIHS  --  713*  --   --   --   --   --  410* 440*  --   --    < > = values in this interval not displayed.     Estimated Creatinine Clearance: 95.9 mL/min (by C-G formula based on SCr of 0.68 mg/dL).   Assessment: 46 y.o. female with ACS for heparin  Goal of Therapy:  Heparin level 0.3-0.7 units/ml Monitor platelets by anticoagulation protocol: Yes   Plan:  Heparin 2000 units IV bolus, then increase heparin  1650 units/hr Follow up after cath today   54, PharmD, BCPS

## 2022-04-21 NOTE — Plan of Care (Signed)
Problem: Education: Goal: Understanding of cardiac disease, CV risk reduction, and recovery process will improve Outcome: Progressing Goal: Individualized Educational Video(s) Outcome: Progressing   Problem: Activity: Goal: Ability to tolerate increased activity will improve Outcome: Progressing   Problem: Cardiac: Goal: Ability to achieve and maintain adequate cardiovascular perfusion will improve Outcome: Progressing   Problem: Health Behavior/Discharge Planning: Goal: Ability to safely manage health-related needs after discharge will improve Outcome: Progressing   Problem: Education: Goal: Understanding of CV disease, CV risk reduction, and recovery process will improve Outcome: Progressing Goal: Individualized Educational Video(s) Outcome: Progressing   Problem: Activity: Goal: Ability to return to baseline activity level will improve Outcome: Progressing   Problem: Cardiovascular: Goal: Ability to achieve and maintain adequate cardiovascular perfusion will improve Outcome: Progressing Goal: Vascular access site(s) Level 0-1 will be maintained Outcome: Progressing   Problem: Health Behavior/Discharge Planning: Goal: Ability to safely manage health-related needs after discharge will improve Outcome: Progressing   Problem: Education: Goal: Ability to describe self-care measures that may prevent or decrease complications (Diabetes Survival Skills Education) will improve Outcome: Progressing Goal: Individualized Educational Video(s) Outcome: Progressing   Problem: Coping: Goal: Ability to adjust to condition or change in health will improve Outcome: Progressing   Problem: Fluid Volume: Goal: Ability to maintain a balanced intake and output will improve Outcome: Progressing   Problem: Health Behavior/Discharge Planning: Goal: Ability to identify and utilize available resources and services will improve Outcome: Progressing Goal: Ability to manage health-related  needs will improve Outcome: Progressing   Problem: Metabolic: Goal: Ability to maintain appropriate glucose levels will improve Outcome: Progressing   Problem: Nutritional: Goal: Maintenance of adequate nutrition will improve Outcome: Progressing Goal: Progress toward achieving an optimal weight will improve Outcome: Progressing   Problem: Skin Integrity: Goal: Risk for impaired skin integrity will decrease Outcome: Progressing   Problem: Tissue Perfusion: Goal: Adequacy of tissue perfusion will improve Outcome: Progressing   Problem: Education: Goal: Knowledge of General Education information will improve Description: Including pain rating scale, medication(s)/side effects and non-pharmacologic comfort measures Outcome: Progressing   Problem: Health Behavior/Discharge Planning: Goal: Ability to manage health-related needs will improve Outcome: Progressing   Problem: Clinical Measurements: Goal: Ability to maintain clinical measurements within normal limits will improve Outcome: Progressing Goal: Will remain free from infection Outcome: Progressing Goal: Diagnostic test results will improve Outcome: Progressing Goal: Respiratory complications will improve Outcome: Progressing Goal: Cardiovascular complication will be avoided Outcome: Progressing   Problem: Activity: Goal: Risk for activity intolerance will decrease Outcome: Progressing   Problem: Nutrition: Goal: Adequate nutrition will be maintained Outcome: Progressing   Problem: Coping: Goal: Level of anxiety will decrease Outcome: Progressing   Problem: Elimination: Goal: Will not experience complications related to bowel motility Outcome: Progressing Goal: Will not experience complications related to urinary retention Outcome: Progressing   Problem: Pain Managment: Goal: General experience of comfort will improve Outcome: Progressing   Problem: Safety: Goal: Ability to remain free from injury will  improve Outcome: Progressing   Problem: Skin Integrity: Goal: Risk for impaired skin integrity will decrease Outcome: Progressing   Problem: Education: Goal: Knowledge of cardiac device and self-care will improve Outcome: Progressing Goal: Ability to safely manage health related needs after discharge will improve Outcome: Progressing Goal: Individualized Educational Video(s) Outcome: Progressing   Problem: Education: Goal: Understanding of CV disease, CV risk reduction, and recovery process will improve Outcome: Progressing Goal: Individualized Educational Video(s) Outcome: Progressing   Problem: Activity: Goal: Ability to return to baseline activity level will improve Outcome: Progressing  Problem: Cardiovascular: Goal: Ability to achieve and maintain adequate cardiovascular perfusion will improve Outcome: Progressing Goal: Vascular access site(s) Level 0-1 will be maintained Outcome: Progressing   Problem: Health Behavior/Discharge Planning: Goal: Ability to safely manage health-related needs after discharge will improve Outcome: Progressing   

## 2022-04-21 NOTE — Progress Notes (Addendum)
Advanced Heart Failure Rounding Note  PCP-Cardiologist: None   Subjective:    Had some N, V early am but much better after ativan. Drowsy but denies CP or dyspnea. Thinks she can lie flat for cath today.  No BMET this am.    Objective:   Weight Range: 88.9 kg Body mass index is 33.64 kg/m.   Vital Signs:   Temp:  [97.8 F (36.6 C)-99 F (37.2 C)] 98.8 F (37.1 C) (11/16 0729) Pulse Rate:  [98-118] 98 (11/16 0729) Resp:  [9-27] 19 (11/16 0729) BP: (104-135)/(56-85) 104/75 (11/16 0729) SpO2:  [92 %-96 %] 94 % (11/16 0346) Last BM Date : 04/18/22  Weight change: Filed Weights   04/18/22 1512 04/19/22 1105 04/20/22 0414  Weight: 90.7 kg 88.5 kg 88.9 kg    Intake/Output:   Intake/Output Summary (Last 24 hours) at 04/21/2022 0841 Last data filed at 04/21/2022 0310 Gross per 24 hour  Intake 1532.56 ml  Output --  Net 1532.56 ml      Physical Exam    General:  Lying in bed. Appears fatigued. HEENT: Normal Neck: Supple. JVP not elevated. Carotids 2+ bilat; no bruits.  Cor: PMI nondisplaced. Regular rate & rhythm, tachy. No rubs, gallops or murmurs. Lungs: Clear Abdomen: Soft, nontender, nondistended.  Extremities: No cyanosis, clubbing, rash, edema Neuro: Alert & orientedx3. moves all 4 extremities w/o difficulty. Affect pleasant   Telemetry   Sinus tach 100s  Labs    CBC Recent Labs    04/18/22 1453 04/19/22 0449 04/20/22 0415 04/21/22 0500  WBC 21.3*   < > 17.7* 12.4*  NEUTROABS 18.8*  --   --   --   HGB 16.3*   < > 14.1 13.9  HCT 47.7*   < > 42.5 41.9  MCV 85.3   < > 86.6 87.1  PLT 327   < > 251 271   < > = values in this interval not displayed.   Basic Metabolic Panel Recent Labs    25/63/89 0449 04/20/22 0415 04/20/22 1142  NA 136 138  --   K 3.3* 3.3*  --   CL 97* 105  --   CO2 20* 23  --   GLUCOSE 336* 257*  --   BUN 10 13  --   CREATININE 0.72 0.68  --   CALCIUM 8.9 8.2*  --   MG  --   --  1.9   Liver Function  Tests Recent Labs    04/18/22 1535 04/20/22 1142  AST 21 18  ALT 20 15  ALKPHOS 74 64  BILITOT 0.9 0.7  PROT 8.1 6.4*  ALBUMIN 4.3 3.3*   Recent Labs    04/18/22 1535  LIPASE 39   Cardiac Enzymes No results for input(s): "CKTOTAL", "CKMB", "CKMBINDEX", "TROPONINI" in the last 72 hours.  BNP: BNP (last 3 results) Recent Labs    04/18/22 1537  BNP 136.4*    ProBNP (last 3 results) No results for input(s): "PROBNP" in the last 8760 hours.   D-Dimer No results for input(s): "DDIMER" in the last 72 hours. Hemoglobin A1C Recent Labs    04/20/22 0415  HGBA1C 9.5*   Fasting Lipid Panel Recent Labs    04/19/22 0449  CHOL 305*  HDL 42  LDLCALC 195*  TRIG 342*  CHOLHDL 7.3   Thyroid Function Tests No results for input(s): "TSH", "T4TOTAL", "T3FREE", "THYROIDAB" in the last 72 hours.  Invalid input(s): "FREET3"  Other results:   Imaging    CT  HEAD WO CONTRAST ( )  Result Date: 04/20/2022 CLINICAL DATA:  Vomiting, hyperglycemia. EXAM: CT HEAD WITHOUT CONTRAST TECHNIQUE: Contiguous axial images were obtained from the base of the skull through the vertex without intravenous contrast. RADIATION DOSE REDUCTION: This exam was performed according to the departmental dose-optimization program which includes automated exposure control, adjustment of the mA and/or kV according to patient size and/or use of iterative reconstruction technique. COMPARISON:  None Available. FINDINGS: Brain: No evidence of acute infarction, hemorrhage, hydrocephalus, extra-axial collection or mass lesion/mass effect. Vascular: No hyperdense vessel or unexpected calcification. Skull: Negative for fracture or focal lesion. Sinuses/Orbits: No acute finding. Other: None. IMPRESSION: No acute intracranial pathology. Electronically Signed   By: Baldemar Lenis M.D.   On: 04/20/2022 10:00     Medications:     Scheduled Medications:  amoxicillin  500 mg Oral Q12H   aspirin EC  81  mg Oral Daily   atorvastatin  80 mg Oral QHS   Chlorhexidine Gluconate Cloth  6 each Topical Q0600   digoxin  0.125 mg Oral Daily   escitalopram  20 mg Oral Daily   gabapentin  300 mg Oral QID   insulin aspart  0-20 Units Subcutaneous TID WC   insulin aspart  4 Units Subcutaneous TID WC   insulin glargine-yfgn  14 Units Subcutaneous BID   LORazepam  0.5 mg Oral BID   metoCLOPramide (REGLAN) injection  10 mg Intravenous Q6H   mupirocin ointment  1 Application Nasal BID   ondansetron  4 mg Intravenous Q8H   pantoprazole (PROTONIX) IV  40 mg Intravenous Q24H   potassium chloride SA  20 mEq Oral BID   sodium chloride flush  10-40 mL Intracatheter Q12H   sodium chloride flush  3 mL Intravenous Q12H    Infusions:  sodium chloride     sodium chloride 20 mL/hr at 04/19/22 1800   heparin 1,650 Units/hr (04/21/22 0753)   nitroGLYCERIN 20 mcg/min (04/20/22 0714)   promethazine (PHENERGAN) injection (IM or IVPB) 12.5 mg (04/21/22 0551)    PRN Medications: sodium chloride, acetaminophen, morphine injection, nitroGLYCERIN, mouth rinse, promethazine (PHENERGAN) injection (IM or IVPB), promethazine, sodium chloride flush, sodium chloride flush, zolpidem    Patient Profile   46 year old female with history of DMII, anxiety, HTN, obesity, Crohn's Disease, hx TBand tobacco abuse. Started ozempic 2 months ago   Admitted with persistent nausea/vomiting. Found to have new HFrEF and elevated troponin.  Assessment/Plan   1. Abdominal Pain, Nausea, Vomiting  - H/O Crohn's disease c/b perirectal fistula and abscess. Cannot take biologics d/t history of TB. - Grandchild with recent GI virus - GI consulted. Concerned about gastroparesis in setting of poorly controlled DM. Less likely Crohn's related - CT abd negative.  - Lactic acid okay - Will need upper endoscopy eventually, but should complete R/LHC first   2. NSTEMI  - HS Trop 846>962>952 - EKG no ischemic changes - Risk factors coronary  disease. Tobacco abuse, hyperlipidemia, and diabetes.  - Denies CP - Continue aspirin and statin   3. Acute HFrEF - Newly diagnosed. No previous cardiac history. Unclear etiology.  - Echo EF 25-30%, RV okay, moderate MR - R/LHC with Dr. Algie Coffer to further evaluate. She believes she could lie flat for the procedure today if sedated. - Volume looks okay. No diuresis for now. - GDMT once N, V resolved. - Continue digoxin 0.125 mg daily   4. DMII  -Hgb A1C 9.5 -Continue SSI per primary   5. Hyperlipidemia  -  LPA 193  -LDL 195 -On high intensity statin on admit -May need to consider PSK9i   6. Sinus Tach  -N/V +/- low output heart failure? Lactic acid okay. -Avoid beta blocker until CO assessed -Continue digoxin   7. Headache  -Developed headache after starting NTG.  -CT Head negative         Length of Stay: 2  Myonna Chisom N, PA-C  04/21/2022, 8:41 AM  Advanced Heart Failure Team Pager [phone] (M-F; 7a - 5p)  Please contact [facility] for night-coverage after hours (5p -7a ) and weekends on [email or web address]

## 2022-04-21 NOTE — Progress Notes (Signed)
Patient ID: LINDSAY FREVERT, female   DOB: March 21, 1976, 46 y.o.   MRN: [id number]    Progress Note   Subjective   Day # 3  CC;Intractable N/V, acute MI  Labs-WBC 12.4/hemoglobin 30.9/hematocrit 41.9 Troponin 713> 410> 440 CT abdomen pelvis 04/18/2022-no gallstones, no focal liver abnormality, no evidence of bowel wall thickening, distention or inflammatory changes, small supraumbilical hernia.  Patient and family feel that IV Zofran and IV Ativan have been most helpful to control nausea and dry heaves, she was able to get some sleep last night Continues to complain of nausea today but has not vomited. Continues to complain of chest pressure. Patient is asking about further GI work-up    Objective    Vital signs in last 24 hours: Temp:  [97.8 F (36.6 C)-99 F (37.2 C)] 98.8 F (37.1 C) (11/16 0729) Pulse Rate:  [98-118] 98 (11/16 0729) Resp:  [9-27] 19 (11/16 0729) BP: (104-135)/(56-81) 104/75 (11/16 0729) SpO2:  [92 %-96 %] 94 % (11/16 0346) Last BM Date : 04/18/22 General:    white female in NAD, family at bedside Heart: tachy  Regular rate and rhythm; no murmurs Lungs: Respirations even and unlabored, lungs CTA bilaterally Abdomen:  Soft, , nondistended, no focal tenderness, bowel sounds are present  Extremities:  Without edema. Neurologic:  Alert and oriented,  grossly normal neurologically. Psych:  Cooperative. Normal mood and affect.  Intake/Output from previous day: 11/15 0701 - 11/16 0700 In: 1716.3 [I.V.:1367.4; IV Piggyback:348.8] Out: -  Intake/Output this shift: No intake/output data recorded.  Lab Results: Recent Labs    04/19/22 0449 04/20/22 0415 04/21/22 0500  WBC 26.2* 17.7* 12.4*  HGB 16.0* 14.1 13.9  HCT 47.0* 42.5 41.9  PLT 371 251 271   BMET Recent Labs    04/18/22 1535 04/19/22 0449 04/20/22 0415  NA 140 136 138  K 3.4* 3.3* 3.3*  CL 104 97* 105  CO2 22 20* 23  GLUCOSE 257* 336* 257*  BUN 10 10 13   CREATININE 0.61 0.72 0.68   CALCIUM 9.3 8.9 8.2*   LFT Recent Labs    04/20/22 1142  PROT 6.4*  ALBUMIN 3.3*  AST 18  ALT 15  ALKPHOS 64  BILITOT 0.7  BILIDIR 0.1  IBILI 0.6   PT/INR Recent Labs    04/19/22 0031  LABPROT 13.8  INR 1.1    Studies/Results: CT HEAD WO CONTRAST (5MM)  Result Date: 04/20/2022 CLINICAL DATA:  Vomiting, hyperglycemia. EXAM: CT HEAD WITHOUT CONTRAST TECHNIQUE: Contiguous axial images were obtained from the base of the skull through the vertex without intravenous contrast. RADIATION DOSE REDUCTION: This exam was performed according to the departmental dose-optimization program which includes automated exposure control, adjustment of the mA and/or kV according to patient size and/or use of iterative reconstruction technique. COMPARISON:  None Available. FINDINGS: Brain: No evidence of acute infarction, hemorrhage, hydrocephalus, extra-axial collection or mass lesion/mass effect. Vascular: No hyperdense vessel or unexpected calcification. Skull: Negative for fracture or focal lesion. Sinuses/Orbits: No acute finding. Other: None. IMPRESSION: No acute intracranial pathology. Electronically Signed   By: Pedro Earls M.D.   On: 04/20/2022 10:00   ECHOCARDIOGRAM COMPLETE  Result Date: 04/19/2022    ECHOCARDIOGRAM REPORT   Patient Name:   ZALEY MATSUSHITA Date of Exam: 04/19/2022 Medical Rec #:  [id number]         Height:       64.0 in Accession #:    [id number]        Weight:  195.1 lb Date of Birth:  24-Mar-1976        BSA:          1.936 m Patient Age:    14 years          BP:           125/100 mmHg Patient Gender: F                 HR:           112 bpm. Exam Location:  Inpatient Procedure: 2D Echo, Cardiac Doppler, Color Doppler and Intracardiac            Opacification Agent Indications:     Acute Ischemic HD  History:         Patient has no prior history of Echocardiogram examinations.                  Risk Factors:Diabetes, Hypertension and Dyslipidemia.   Sonographer:     Wenda Low Referring Phys:  [place] Diagnosing Phys: Dixie Dials MD IMPRESSIONS  1. Left ventricular ejection fraction, by estimation, is 25 to 30%. The left ventricle has severely decreased function. The left ventricle demonstrates global hypokinesis. The left ventricular internal cavity size was mildly dilated. Left ventricular diastolic parameters are consistent with Grade II diastolic dysfunction (pseudonormalization). There is severe hypokinesis of the left ventricular, mid anteroseptal wall. There is moderate hypokinesis of the left ventricular, mid anterolateral wall. There is mild hypokinesis of the left ventricular, entire inferolateral wall.  2. Right ventricular systolic function is normal. The right ventricular size is normal. There is mildly elevated pulmonary artery systolic pressure.  3. Left atrial size was moderately dilated.  4. Right atrial size was mildly dilated.  5. The mitral valve is normal in structure. Moderate mitral valve regurgitation.  6. The aortic valve is tricuspid. There is mild calcification of the aortic valve. Aortic valve regurgitation is not visualized. Aortic valve sclerosis is present, with no evidence of aortic valve stenosis.  7. The inferior vena cava is dilated in size with <50% respiratory variability, suggesting right atrial pressure of 15 mmHg. FINDINGS  Left Ventricle: Left ventricular ejection fraction, by estimation, is 25 to 30%. The left ventricle has severely decreased function. The left ventricle demonstrates global hypokinesis. Severe hypokinesis of the left ventricular, mid anteroseptal wall. Moderate hypokinesis of the left ventricular, mid anterolateral wall. Mild hypokinesis of the left ventricular, entire inferolateral wall. Definity contrast agent was given IV to delineate the left ventricular endocardial borders. The left ventricular internal cavity size was mildly dilated. There is borderline concentric left ventricular  hypertrophy. Left ventricular diastolic parameters are consistent with Grade II diastolic dysfunction (pseudonormalization).  LV Wall Scoring: The entire anterior wall, entire lateral wall, inferior wall, mid anteroseptal segment, and mid inferoseptal segment are hypokinetic. The basal anteroseptal segment, apical septal segment, apical inferior segment, basal inferoseptal segment, and apex are normal. Right Ventricle: The right ventricular size is normal. No increase in right ventricular wall thickness. Right ventricular systolic function is normal. There is mildly elevated pulmonary artery systolic pressure. The tricuspid regurgitant velocity is 2.89  m/s, and with an assumed right atrial pressure of 8 mmHg, the estimated right ventricular systolic pressure is 123456 mmHg. Left Atrium: Left atrial size was moderately dilated. Right Atrium: Right atrial size was mildly dilated. Pericardium: There is no evidence of pericardial effusion. Mitral Valve: The mitral valve is normal in structure. Moderate mitral valve regurgitation. MV peak gradient, 2.9 mmHg. The  mean mitral valve gradient is 1.0 mmHg. Tricuspid Valve: The tricuspid valve is normal in structure. Tricuspid valve regurgitation is trivial. Aortic Valve: The aortic valve is tricuspid. There is mild calcification of the aortic valve. Aortic valve regurgitation is not visualized. Aortic valve sclerosis is present, with no evidence of aortic valve stenosis. Aortic valve mean gradient measures 1.0 mmHg. Aortic valve peak gradient measures 2.4 mmHg. Aortic valve area, by VTI measures 3.46 cm. Pulmonic Valve: The pulmonic valve was normal in structure. Pulmonic valve regurgitation is trivial. Aorta: The aortic root is normal in size and structure. Venous: The inferior vena cava is dilated in size with less than 50% respiratory variability, suggesting right atrial pressure of 15 mmHg. IAS/Shunts: The atrial septum is grossly normal.  LEFT VENTRICLE PLAX 2D LVIDd:          5.00 cm   Diastology LVIDs:         4.50 cm   LV e' medial:    7.40 cm/s LV PW:         1.00 cm   LV E/e' medial:  11.8 LV IVS:        1.00 cm   LV e' lateral:   5.98 cm/s LVOT diam:     2.00 cm   LV E/e' lateral: 14.5 LV SV:         44 LV SV Index:   23 LVOT Area:     3.14 cm  RIGHT VENTRICLE RV Basal diam:  3.50 cm RV Mid diam:    2.90 cm RV S prime:     11.00 cm/s TAPSE (M-mode): 2.3 cm LEFT ATRIUM             Index        RIGHT ATRIUM           Index LA diam:        4.00 cm 2.07 cm/m   RA Area:     17.80 cm LA Vol (A2C):   64.3 ml 33.21 ml/m  RA Volume:   49.10 ml  25.36 ml/m LA Vol (A4C):   76.4 ml 39.47 ml/m LA Biplane Vol: 73.1 ml 37.76 ml/m  AORTIC VALVE                    PULMONIC VALVE AV Area (Vmax):    3.15 cm     PV Vmax:       0.71 m/s AV Area (Vmean):   2.90 cm     PV Peak grad:  2.0 mmHg AV Area (VTI):     3.46 cm AV Vmax:           77.60 cm/s AV Vmean:          55.700 cm/s AV VTI:            0.127 m AV Peak Grad:      2.4 mmHg AV Mean Grad:      1.0 mmHg LVOT Vmax:         77.70 cm/s LVOT Vmean:        51.500 cm/s LVOT VTI:          0.140 m LVOT/AV VTI ratio: 1.10  AORTA Ao Root diam: 3.30 cm MITRAL VALVE               TRICUSPID VALVE MV Area (PHT): 5.66 cm    TR Peak grad:   33.4 mmHg MV Area VTI:   2.78 cm    TR Vmax:  289.00 cm/s MV Peak grad:  2.9 mmHg MV Mean grad:  1.0 mmHg    SHUNTS MV Vmax:       0.86 m/s    Systemic VTI:  0.14 m MV Vmean:      47.1 cm/s   Systemic Diam: 2.00 cm MV Decel Time: 134 msec MV E velocity: 87.00 cm/s MV A velocity: 44.60 cm/s MV E/A ratio:  1.95 Dixie Dials MD Electronically signed by Dixie Dials MD Signature Date/Time: 04/19/2022/5:36:58 PM    Final        Assessment / Plan:    #71 46 year old white female with a history of adult onset diabetes mellitus, anxiety, hypertension, obesity, remote history of Crohn's disease, on no therapy for many years She had started Ozempic a couple of months ago for weight loss  Admitted with  persistent nausea and vomiting and found to have new heart failure and elevated troponin-EF 25 to 30% moderate MR  Etiology of nausea and vomiting is not entirely clear,, rule out secondary to acute coronary syndrome, certainly at risk for gastroparesis with diabetes and Ozempic. CT of the abdomen pelvis on admission did not show any evidence of Crohn's. Normal-appearing gallbladder Does use cannabis on a daily basis consider cannabis hyperemesis Reports  that she has been having chronic GI symptoms with diarrhea for years, worsening problems with belching and burping over the past 6 months   Elevated troponins consistent with NSTEMI-Zea and vomiting improved today and plan is to go ahead with cardiac cath.   Plan; cardiac cath today Continue Zofran 8 mg every 8 hours as needed We will continue metoclopramide 10 mg IV every 6 hours today, then reassess need tomorrow Continue Ativan 0.5 mg will decrease to every 6 hours as needed Continue twice daily IV PPI Okay for clear to full liquid diet if she can tolerate post cath Not anticipate any endoscopic evaluation or other GI work-up during this admission    Principal Problem:   Acute coronary syndrome [facility]) Active Problems:   NSTEMI (non-ST elevated myocardial infarction) ([place])     LOS: 2 days   Kloee Ballew PA-C 04/21/2022, 10:43 AM

## 2022-04-21 NOTE — Progress Notes (Signed)
Site area: Right groin a 5 french arterial sheath and 7 french venous sheath was removed by Amellia RN 2 H  Site Prior to Removal:  Level 0  Pressure Applied For 30 MINUTES    Bedrest Beginning at 1420 pm X 4 hours  Manual:   Yes.    Patient Status During Pull:  stable  Post Pull Groin Site:  Level 0  Post Pull Instructions Given:  Yes.    Post Pull Pulses Present:  Yes.    Dressing Applied:  Yes.    Comments:

## 2022-04-21 NOTE — Plan of Care (Signed)
  Problem: Education: Goal: Understanding of cardiac disease, CV risk reduction, and recovery process will improve Outcome: Progressing   Problem: Education: Goal: Understanding of CV disease, CV risk reduction, and recovery process will improve Outcome: Progressing   Problem: Coping: Goal: Ability to adjust to condition or change in health will improve Outcome: Progressing   Problem: Health Behavior/Discharge Planning: Goal: Ability to identify and utilize available resources and services will improve Outcome: Progressing

## 2022-04-21 NOTE — Plan of Care (Signed)
  Problem: Education: Goal: Understanding of cardiac disease, CV risk reduction, and recovery process will improve Outcome: Progressing   Problem: Cardiac: Goal: Ability to achieve and maintain adequate cardiovascular perfusion will improve Outcome: Progressing   Problem: Education: Goal: Understanding of CV disease, CV risk reduction, and recovery process will improve Outcome: Progressing   Problem: Activity: Goal: Ability to return to baseline activity level will improve Outcome: Progressing

## 2022-04-22 ENCOUNTER — Telehealth (HOSPITAL_COMMUNITY): Payer: Self-pay | Admitting: Pharmacy Technician

## 2022-04-22 ENCOUNTER — Other Ambulatory Visit (HOSPITAL_COMMUNITY): Payer: Self-pay

## 2022-04-22 ENCOUNTER — Encounter: Payer: Medicaid Other | Admitting: Registered"

## 2022-04-22 ENCOUNTER — Encounter (HOSPITAL_COMMUNITY): Payer: Self-pay | Admitting: Cardiovascular Disease

## 2022-04-22 LAB — BASIC METABOLIC PANEL
Anion gap: 8 (ref 5–15)
BUN: 12 mg/dL (ref 6–20)
CO2: 27 mmol/L (ref 22–32)
Calcium: 8.3 mg/dL — ABNORMAL LOW (ref 8.9–10.3)
Chloride: 103 mmol/L (ref 98–111)
Creatinine, Ser: 0.62 mg/dL (ref 0.44–1.00)
GFR, Estimated: >60 mL/min (ref >60)
Glucose, Bld: 145 mg/dL — ABNORMAL HIGH (ref 70–99)
Potassium: 2.8 mmol/L — ABNORMAL LOW (ref 3.5–5.1)
Sodium: 138 mmol/L (ref 135–145)

## 2022-04-22 LAB — CBC
HCT: 35.5 % — ABNORMAL LOW (ref 36.0–46.0)
Hemoglobin: 12.1 g/dL (ref 12.0–15.0)
MCH: 29.6 pg (ref 26.0–34.0)
MCHC: 34.1 g/dL (ref 30.0–36.0)
MCV: 86.8 fL (ref 80.0–100.0)
Platelets: 219 10*3/uL (ref 150–400)
RBC: 4.09 MIL/uL (ref 3.87–5.11)
RDW: 13.5 % (ref 11.5–15.5)
WBC: 9.5 10*3/uL (ref 4.0–10.5)
nRBC: 0 % (ref 0.0–0.2)

## 2022-04-22 LAB — GLUCOSE, CAPILLARY
Glucose-Capillary: 118 mg/dL — ABNORMAL HIGH (ref 70–99)
Glucose-Capillary: 127 mg/dL — ABNORMAL HIGH (ref 70–99)
Glucose-Capillary: 109 mg/dL — ABNORMAL HIGH (ref 70–99)
Glucose-Capillary: 139 mg/dL — ABNORMAL HIGH (ref 70–99)

## 2022-04-22 LAB — MAGNESIUM: Magnesium: 2 mg/dL (ref 1.7–2.4)

## 2022-04-22 MED ORDER — ASPIRIN 81 MG PO TBEC
81.0000 mg | DELAYED_RELEASE_TABLET | Freq: Every day | ORAL | 12 refills | Status: DC
  Filled 2022-04-22: qty 30, 30d supply, fill #0

## 2022-04-22 MED ORDER — POTASSIUM CHLORIDE 10 MEQ/100ML IV SOLN
10.0000 meq | Freq: Once | INTRAVENOUS | Status: AC
  Administered 2022-04-22: 10 meq via INTRAVENOUS
  Filled 2022-04-22: qty 100

## 2022-04-22 MED ORDER — NICOTINE 21 MG/24HR TD PT24
21.0000 mg | MEDICATED_PATCH | Freq: Every day | TRANSDERMAL | 0 refills | Status: DC
  Filled 2022-04-22: qty 28, 28d supply, fill #0

## 2022-04-22 MED ORDER — CARVEDILOL 3.125 MG PO TABS
3.1250 mg | ORAL_TABLET | Freq: Two times a day (BID) | ORAL | Status: DC
  Administered 2022-04-22: 3.125 mg via ORAL
  Filled 2022-04-22: qty 1

## 2022-04-22 MED ORDER — PANTOPRAZOLE SODIUM 40 MG PO TBEC: 40.0000 mg | DELAYED_RELEASE_TABLET | Freq: Every day | ORAL | Status: DC

## 2022-04-22 MED ORDER — PANTOPRAZOLE SODIUM 40 MG PO TBEC
40.0000 mg | DELAYED_RELEASE_TABLET | Freq: Every day | ORAL | 1 refills | Status: DC
  Filled 2022-04-22: qty 30, 30d supply, fill #0

## 2022-04-22 MED ORDER — NITROGLYCERIN 0.4 MG SL SUBL
0.4000 mg | SUBLINGUAL_TABLET | SUBLINGUAL | 3 refills | Status: AC | PRN
  Filled 2022-04-22: qty 25, 7d supply, fill #0

## 2022-04-22 MED ORDER — POTASSIUM CHLORIDE CRYS ER 20 MEQ PO TBCR
40.0000 meq | EXTENDED_RELEASE_TABLET | Freq: Two times a day (BID) | ORAL | Status: DC
  Administered 2022-04-22: 40 meq via ORAL

## 2022-04-22 MED ORDER — SPIRONOLACTONE 25 MG PO TABS
12.5000 mg | ORAL_TABLET | Freq: Every day | ORAL | 3 refills | Status: DC
  Filled 2022-04-22: qty 30, 60d supply, fill #0

## 2022-04-22 MED ORDER — POTASSIUM CHLORIDE 10 MEQ/100ML IV SOLN
10.0000 meq | INTRAVENOUS | Status: AC
  Administered 2022-04-22: 10 meq via INTRAVENOUS
  Filled 2022-04-22: qty 100

## 2022-04-22 MED ORDER — SPIRONOLACTONE 12.5 MG HALF TABLET
12.5000 mg | ORAL_TABLET | Freq: Every day | ORAL | Status: DC
  Administered 2022-04-22: 12.5 mg via ORAL
  Filled 2022-04-22: qty 1

## 2022-04-22 MED ORDER — ONDANSETRON HCL 4 MG PO TABS
4.0000 mg | ORAL_TABLET | Freq: Four times a day (QID) | ORAL | 1 refills | Status: AC | PRN
  Filled 2022-04-22: qty 56, 14d supply, fill #0

## 2022-04-22 MED ORDER — DIGOXIN 125 MCG PO TABS
0.1250 mg | ORAL_TABLET | Freq: Every day | ORAL | 3 refills | Status: DC
  Filled 2022-04-22: qty 30, 30d supply, fill #0

## 2022-04-22 MED ORDER — NICOTINE 21 MG/24HR TD PT24
21.0000 mg | MEDICATED_PATCH | Freq: Every day | TRANSDERMAL | Status: DC
  Administered 2022-04-22: 21 mg via TRANSDERMAL
  Filled 2022-04-22: qty 1

## 2022-04-22 MED ORDER — CARVEDILOL 3.125 MG PO TABS
3.1250 mg | ORAL_TABLET | Freq: Two times a day (BID) | ORAL | 3 refills | Status: DC
  Filled 2022-04-22: qty 60, 30d supply, fill #0

## 2022-04-22 NOTE — Telephone Encounter (Signed)
Patient Advocate Encounter  Prior Authorization for Entresto 24-26MG  tablets has been approved.    PA# [id number] Effective dates: 04/22/2022 through 04/23/2023  Patients co-pay is $4.00.     Roland Earl, CPhT Pharmacy Patient Advocate Specialist [facility] Patient Advocate Team Direct Number: [phone]  Fax: [phone]

## 2022-04-22 NOTE — Progress Notes (Addendum)
Advanced Heart Failure Rounding Note  PCP-Cardiologist: None   Subjective:   RHC/LHC  CO 5.25, CI 2.7 RA 2 PCWP 8 Prox LAD lesion is 25% stenosed.   Mid LAD lesion is 30% stenosed.   Prox RCA lesion is 50% stenosed.   Able to eat. No vomiting over the last 24 hours.  Objective:   Weight Range: 88.8 kg Body mass index is 33.6 kg/m.   Vital Signs:   Temp:  [97.8 F (36.6 C)-99 F (37.2 C)] 98.5 F (36.9 C) (11/17 0338) Pulse Rate:  [73-112] 73 (11/17 0338) Resp:  [13-26] 18 (11/17 0338) BP: (92-150)/(66-99) 92/66 (11/17 0338) SpO2:  [89 %-97 %] 97 % (11/17 0338) Weight:  [88.8 kg] 88.8 kg (11/17 0613) Last BM Date : 04/18/22  Weight change: Filed Weights   04/19/22 1105 04/20/22 0414 04/22/22 0613  Weight: 88.5 kg 88.9 kg 88.8 kg    Intake/Output:   Intake/Output Summary (Last 24 hours) at 04/22/2022 0930 Last data filed at 04/22/2022 0416 Gross per 24 hour  Intake 645.5 ml  Output --  Net 645.5 ml      Physical Exam  General:  In bed. No distress.  No resp difficulty HEENT: normal Neck: supple. no JVD. Carotids 2+ bilat; no bruits. No lymphadenopathy or thryomegaly appreciated. Cor: PMI nondisplaced. Regular rate & rhythm. No rubs, gallops or murmurs. Lungs: clear Abdomen: soft, nontender, nondistended. No hepatosplenomegaly. No bruits or masses. Good bowel sounds. Extremities: no cyanosis, clubbing, rash, edema Neuro: alert & orientedx3, cranial nerves grossly intact. moves all 4 extremities w/o difficulty. Affect pleasant   Telemetry   SR/ST 70-100s  Labs    CBC Recent Labs    04/21/22 0500 04/21/22 1306 04/21/22 1318 04/22/22 0127  WBC 12.4*  --   --  9.5  HGB 13.9   < > 12.2 12.1  HCT 41.9   < > 36.0 35.5*  MCV 87.1  --   --  86.8  PLT 271  --   --  219   < > = values in this interval not displayed.   Basic Metabolic Panel Recent Labs    04/20/22 1142 04/21/22 1130 04/21/22 1306 04/21/22 1318 04/22/22 0127  NA  --  139    < > 139 138  K  --  3.2*   < > 3.2* 2.8*  CL  --  104  --   --  103  CO2  --  25  --   --  27  GLUCOSE  --  142*  --   --  145*  BUN  --  14  --   --  12  CREATININE  --  0.76  --   --  0.62  CALCIUM  --  8.3*  --   --  8.3*  MG 1.9  --   --   --   --    < > = values in this interval not displayed.   Liver Function Tests Recent Labs    04/20/22 1142  AST 18  ALT 15  ALKPHOS 64  BILITOT 0.7  PROT 6.4*  ALBUMIN 3.3*   No results for input(s): "LIPASE", "AMYLASE" in the last 72 hours.  Cardiac Enzymes No results for input(s): "CKTOTAL", "CKMB", "CKMBINDEX", "TROPONINI" in the last 72 hours.  BNP: BNP (last 3 results) Recent Labs    04/18/22 1537  BNP 136.4*    ProBNP (last 3 results) No results for input(s): "PROBNP" in the last 8760 hours.  D-Dimer No results for input(s): "DDIMER" in the last 72 hours. Hemoglobin A1C Recent Labs    04/20/22 0415  HGBA1C 9.5*   Fasting Lipid Panel No results for input(s): "CHOL", "HDL", "LDLCALC", "TRIG", "CHOLHDL", "LDLDIRECT" in the last 72 hours.  Thyroid Function Tests No results for input(s): "TSH", "T4TOTAL", "T3FREE", "THYROIDAB" in the last 72 hours.  Invalid input(s): "FREET3"  Other results:   Imaging    CARDIAC CATHETERIZATION  Result Date: 04/21/2022   Prox LAD lesion is 25% stenosed.   Mid LAD lesion is 30% stenosed.   Prox RCA lesion is 50% stenosed.   There is severe left ventricular systolic dysfunction.   LV end diastolic pressure is normal.   The left ventricular ejection fraction is less than 25% by visual estimate.   Hemodynamic findings consistent with pulmonary hypertension. Life style modification, smoking cessation, diet and activity.     Medications:     Scheduled Medications:  aspirin EC  81 mg Oral Daily   atorvastatin  80 mg Oral QHS   Chlorhexidine Gluconate Cloth  6 each Topical Q0600   clopidogrel  75 mg Oral Q breakfast   digoxin  0.125 mg Oral Daily   escitalopram  20 mg Oral  Daily   gabapentin  300 mg Oral QID   heparin  5,000 Units Subcutaneous Q8H   insulin aspart  0-20 Units Subcutaneous TID WC   insulin aspart  4 Units Subcutaneous TID WC   insulin glargine-yfgn  14 Units Subcutaneous BID   LORazepam  0.5 mg Oral BID   mupirocin ointment  1 Application Nasal BID   pantoprazole (PROTONIX) IV  40 mg Intravenous Q24H   potassium chloride SA  20 mEq Oral BID   sodium chloride flush  10-40 mL Intracatheter Q12H   sodium chloride flush  3 mL Intravenous Q12H   sodium chloride flush  3 mL Intravenous Q12H    Infusions:  sodium chloride     sodium chloride 20 mL/hr at 04/19/22 1800   sodium chloride     nitroGLYCERIN Stopped (04/21/22 0500)   promethazine (PHENERGAN) injection (IM or IVPB) 12.5 mg (04/21/22 0551)    PRN Medications: sodium chloride, sodium chloride, acetaminophen, LORazepam, morphine injection, nitroGLYCERIN, ondansetron, mouth rinse, mouth rinse, promethazine (PHENERGAN) injection (IM or IVPB), promethazine, sodium chloride flush, sodium chloride flush, sodium chloride flush, zolpidem    Patient Profile   46 year old female with history of DMII, anxiety, HTN, obesity, Crohn's Disease, hx TBand tobacco abuse. Started ozempic 2 months ago   Admitted with persistent nausea/vomiting. Found to have new HFrEF and elevated troponin.  Assessment/Plan   1. Abdominal Pain, Nausea, Vomiting  - H/O Crohn's disease c/b perirectal fistula and abscess. Cannot take biologics d/t history of TB. - Grandchild with recent GI virus - GI consulted. Concerned about gastroparesis in setting of poorly controlled DM. Less likely Crohn's related - CT abd negative.  -EGD will be done as an outpatient per GI    2. NSTEMI  - HS Trop [id number] - EKG no ischemic changes - Risk factors coronary disease. Tobacco abuse, hyperlipidemia, and diabetes.  - LHC- Prox/Mid LAD 25-30%, RCA 50%.  - Continue aspirin and high intensity statin - Adding bb.    3.  Acute HFrEF - Newly diagnosed. No previous cardiac history. Unclear etiology.  - Echo EF 25-30%, RV okay, moderate MR - R/LHC- Prox LAD lesion is 25% stenosed, Mid LAD lesion is 30% stenosed, and RCA lesion is 50% stenosed. Preserved cardiac ouput  and low filling pressures.  - Add coreg 3.125 mg twice a day.  - Supp K. Add 12.5 mg spiro daily  - Restart farxiga 10 mg daily at discharge.    - Continue digoxin 0.125 mg daily - Hold off entresto. Consider as an outpatient.  - Consider CMRI.    4. DMII  -Hgb A1C 9.5 -Continue SSI per primary - On Farxiga prior to admit. Restart at discharge.    5. Hyperlipidemia  -LPA 193  -LDL 195 -On high intensity statin on admit -May need to consider PSK9i   6. Sinus Tach  -N/V +/- low output heart failure? Lactic acid okay. - -Continue digoxin   7. Headache  -Developed headache after starting NTG.  -CT Head negative   Primary team started plavix last night.    Ambulate .       Length of Stay: 3  Amy Clegg, NP  04/22/2022, 9:30 AM  Advanced Heart Failure Team Pager [phone] (M-F; 7a - 5p)  Please contact [facility] for night-coverage after hours (5p -7a ) and weekends on [email or web address]  Patient seen and examined with the above-signed Advanced Practice Provider and/or Housestaff. I personally reviewed laboratory data, imaging studies and relevant notes. I independently examined the patient and formulated the important aspects of the plan. I have edited the note to reflect any of my changes or salient points. I have personally discussed the plan with the patient and/or family.  Nausea much improved. Tolerating po intake. Denies SOB, orthopnea or PND. Able to [employer]  General:  Well appearing. No resp difficulty HEENT: normal Neck: supple. no JVD. Carotids 2+ bilat; no bruits. No lymphadenopathy or thryomegaly appreciated. Cor: PMI nondisplaced. Regular rate & rhythm. No rubs, gallops or murmurs. Lungs: clear Abdomen: obese  soft, nontender, nondistended. No hepatosplenomegaly. No bruits or masses. Good bowel sounds. Extremities: no cyanosis, clubbing, rash, edema Neuro: alert & orientedx3, cranial nerves grossly intact. moves all 4 extremities w/o difficulty. Affect pleasant  She is improving. Suggested watching for one more day but she is eager to go home. Agree with GDMT as above. Will follow closely in HF Clinic. Consider outpatient cMRI.   Glori Bickers, MD  5:01 PM

## 2022-04-22 NOTE — Progress Notes (Signed)
Ref: [facility]   Subjective:  Awake. VS stable. Discussed diet, medications and activity. Patient belies nausea, vomiting form increased dose of Ozempic. She will ask primary care to decrease the dose. Consider Entresto use from tomorrow.  Objective:  Vital Signs in the last 24 hours: Temp:  [97.8 F (36.6 C)-99 F (37.2 C)] 98.6 F (37 C) (11/17 0931) Pulse Rate:  [73-112] 85 (11/17 0931) Cardiac Rhythm: Normal sinus rhythm (11/17 0700) Resp:  [13-26] 20 (11/17 0931) BP: (92-150)/(66-99) 98/76 (11/17 0931) SpO2:  [89 %-100 %] 100 % (11/17 0931) Weight:  [88.8 kg] 88.8 kg (11/17 9629)  Physical Exam: BP Readings from Last 1 Encounters:  04/22/22 98/76     Wt Readings from Last 1 Encounters:  04/22/22 88.8 kg    Weight change:  Body mass index is 33.6 kg/m. HEENT: [place]/AT, Eyes-Brown, Conjunctiva-Pink, Sclera-Non-icteric Neck: No JVD, No bruit, Trachea midline. Lungs:  Clear, Bilateral. Cardiac:  Regular rhythm, normal S1 and S2, no S3. II/VI systolic murmur. Abdomen:  Soft, non-tender. BS present. Extremities:  No edema present. No cyanosis. No clubbing. Right groin negative for bruize or pain or discharge. CNS: AxOx3, Cranial nerves grossly intact, moves all 4 extremities.  Skin: Warm and dry.   Intake/Output from previous day: 11/16 0701 - 11/17 0700 In: 645.5 [P.O.:600; I.V.:45.5] Out: -     Lab Results: BMET    Component Value Date/Time   NA 138 04/22/2022 0127   NA 139 04/21/2022 1318   NA 139 04/21/2022 1306   K 2.8 (L) 04/22/2022 0127   K 3.2 (L) 04/21/2022 1318   K 3.2 (L) 04/21/2022 1306   CL 103 04/22/2022 0127   CL 104 04/21/2022 1130   CL 105 04/20/2022 0415   CO2 27 04/22/2022 0127   CO2 25 04/21/2022 1130   CO2 23 04/20/2022 0415   GLUCOSE 145 (H) 04/22/2022 0127   GLUCOSE 142 (H) 04/21/2022 1130   GLUCOSE 257 (H) 04/20/2022 0415   BUN 12 04/22/2022 0127   BUN 14 04/21/2022 1130   BUN 13 04/20/2022 0415   CREATININE  0.62 04/22/2022 0127   CREATININE 0.76 04/21/2022 1130   CREATININE 0.68 04/20/2022 0415   CALCIUM 8.3 (L) 04/22/2022 0127   CALCIUM 8.3 (L) 04/21/2022 1130   CALCIUM 8.2 (L) 04/20/2022 0415   GFRNONAA >60 04/22/2022 0127   GFRNONAA >60 04/21/2022 1130   GFRNONAA >60 04/20/2022 0415   GFRAA >60 11/16/2016 0818   GFRAA >60 01/02/2016 1354   GFRAA  08/04/2010 0345    >60        The eGFR has been calculated using the MDRD equation. This calculation has not been validated in all clinical situations. eGFR's persistently <60 mL/min signify possible Chronic Kidney Disease.   CBC    Component Value Date/Time   WBC 9.5 04/22/2022 0127   RBC 4.09 04/22/2022 0127   HGB 12.1 04/22/2022 0127   HCT 35.5 (L) 04/22/2022 0127   PLT 219 04/22/2022 0127   MCV 86.8 04/22/2022 0127   MCH 29.6 04/22/2022 0127   MCHC 34.1 04/22/2022 0127   RDW 13.5 04/22/2022 0127   LYMPHSABS 1.4 04/18/2022 1453   MONOABS 1.0 04/18/2022 1453   EOSABS 0.0 04/18/2022 1453   BASOSABS 0.1 04/18/2022 1453   HEPATIC Function Panel Recent Labs    04/18/22 1535 04/20/22 1142  PROT 8.1 6.4*  ALBUMIN 4.3 3.3*  AST 21 18  ALT 20 15  ALKPHOS 34 64  BILIDIR  --  0.1  IBILI  --  0.6   HEMOGLOBIN A1C Lab Results  Component Value Date   MPG 225.95 04/20/2022   CARDIAC ENZYMES No results found for: "CKTOTAL", "CKMB", "CKMBINDEX", "TROPONINI" BNP No results for input(s): "PROBNP" in the last 8760 hours. TSH No results for input(s): "TSH" in the last 8760 hours. CHOLESTEROL Recent Labs    04/19/22 0449  CHOL 305*    Scheduled Meds:  aspirin EC  81 mg Oral Daily   atorvastatin  80 mg Oral QHS   carvedilol  3.125 mg Oral BID WC   Chlorhexidine Gluconate Cloth  6 each Topical Q0600   clopidogrel  75 mg Oral Q breakfast   digoxin  0.125 mg Oral Daily   escitalopram  20 mg Oral Daily   gabapentin  300 mg Oral QID   heparin  5,000 Units Subcutaneous Q8H   insulin aspart  0-20 Units Subcutaneous TID WC    insulin aspart  4 Units Subcutaneous TID WC   insulin glargine-yfgn  14 Units Subcutaneous BID   LORazepam  0.5 mg Oral BID   mupirocin ointment  1 Application Nasal BID   pantoprazole (PROTONIX) IV  40 mg Intravenous Q24H   potassium chloride SA  40 mEq Oral BID   sodium chloride flush  10-40 mL Intracatheter Q12H   sodium chloride flush  3 mL Intravenous Q12H   sodium chloride flush  3 mL Intravenous Q12H   spironolactone  12.5 mg Oral Daily   Continuous Infusions:  sodium chloride     sodium chloride 20 mL/hr at 04/19/22 1800   sodium chloride     nitroGLYCERIN Stopped (04/21/22 0500)   potassium chloride     promethazine (PHENERGAN) injection (IM or IVPB) 12.5 mg (04/21/22 0551)   PRN Meds:.sodium chloride, sodium chloride, acetaminophen, LORazepam, morphine injection, nitroGLYCERIN, ondansetron, mouth rinse, mouth rinse, promethazine (PHENERGAN) injection (IM or IVPB), promethazine, sodium chloride flush, sodium chloride flush, sodium chloride flush, zolpidem  Assessment/Plan: Acute on chronic systolic left heart failure Acute cardiogenic shock Acute pulmonary edema Multivessel CAD NSTEMI Type 2 DM with hyperglycemia Obesity Tobacco use disorder Anxiety  Plan: Add Entresto tomorrow. Continue aldactone, Plavix, Potassium and Lorazepam. Add Nicotine patch.   LOS: 3 days   Time spent including chart review, lab review, examination, discussion with patient :  min   Dixie Dials  MD  04/22/2022, 10:41 AM

## 2022-04-22 NOTE — TOC Initial Note (Signed)
Transition of Care [facility]) - Initial/Assessment Note    Patient Details  Name: [name] MRN: [id number] Date of Birth: 21-Feb-1976  [facility]) CM/SW Contact:    Elliot Cousin, RN Phone Number: [phone] 04/22/2022, 2:05 PM  Clinical Narrative:                 HF TOC CM spoke to pt, mother and sister at bedside. Pt drives to appt. Education provided on daily weights. States she saw PCP in past two weeks. She will call to schedule a follow hospital appt. Has scale at home. Pt states she plans to quit smoking. Declines referral to Diabetes Coordinator to discuss diet. She states she knows what to eat and just plans to be more adherent to healthy lifestyle. Request sent to attending for meds to come up from [facility] pharmacy for possible dc tomorrow. Pt states her pharmacy is closed on weekend.   Expected Discharge Plan: Home/Self Care Barriers to Discharge: Continued Medical Work up   Patient Goals and CMS Choice Patient states their goals for this hospitalization and ongoing recovery are:: wants to be able to care for grandchildren      Expected Discharge Plan and Services Expected Discharge Plan: Home/Self Care   Discharge Planning Services: CM Consult   Living arrangements for the past 2 months: Apartment                                      Prior Living Arrangements/Services Living arrangements for the past 2 months: Apartment Lives with:: Self, Minor Children Patient language and need for interpreter reviewed:: Yes Do you feel safe going back to the place where you live?: Yes      Need for Family Participation in Patient Care: No (Comment) Care giver support system in place?: Yes (comment)   Criminal Activity/Legal Involvement Pertinent to Current Situation/Hospitalization: No - Comment as needed  Activities of Daily Living Home Assistive Devices/Equipment: None ADL Screening (condition at time of admission) Patient's cognitive ability  adequate to safely complete daily activities?: Yes Is the patient deaf or have difficulty hearing?: No Does the patient have difficulty seeing, even when wearing glasses/contacts?: No Does the patient have difficulty concentrating, remembering, or making decisions?: No Patient able to express need for assistance with ADLs?: Yes Does the patient have difficulty dressing or bathing?: No Independently performs ADLs?: Yes (appropriate for developmental age) Does the patient have difficulty walking or climbing stairs?: No Weakness of Legs: Both Weakness of Arms/Hands: None  Permission Sought/Granted Permission sought to share information with : Case Manager, Family Supports, PCP Permission granted to share information with : Yes, Verbal Permission Granted  Share Information with NAME: Craige Cotta     Permission granted to share info w Relationship: daughter  Permission granted to share info w Contact Information: [phone]  Emotional Assessment Appearance:: Appears stated age Attitude/Demeanor/Rapport: Engaged Affect (typically observed): Accepting Orientation: : Oriented to Self, Oriented to Place, Oriented to  Time, Oriented to Situation Alcohol / Substance Use: Illicit Drugs, Tobacco Use Psych Involvement: No (comment)  Admission diagnosis:  Dehydration [E86.0] Acute pulmonary edema (HCC) [J81.0] Acute coronary syndrome (HCC) [I24.9] NSTEMI (non-ST elevated myocardial infarction) (HCC) [I21.4] Acute respiratory failure with hypoxia (HCC) [J96.01] Intractable nausea and vomiting [R11.2] Marijuana use [F12.90] Patient Active Problem List   Diagnosis Date Noted   NSTEMI (non-ST elevated myocardial infarction) (HCC) 04/20/2022   Anxiety  04/19/2022   Depressive disorder 04/19/2022   Chronic back pain 04/19/2022   Gastrointestinal Crohn's disease (HCC) 04/19/2022   Neuropathy 04/19/2022   Acute coronary syndrome (HCC) 04/19/2022   Intractable nausea and vomiting 02/22/2021    Type 2 diabetes mellitus (HCC) 10/20/2020   Dyslipidemia 10/20/2020   Hyperlipidemia 07/17/2020   Hypertensive disorder 07/17/2020   PCP:  [facility]:   [id number] [place], [place] - [address] AT [facility] [address] [place] [address] Phone: [phone] Fax: [phone]  [facility] - [place], [place] - [address] [place] [address] Phone: [phone] Fax: [phone]     Social Determinants of Health (SDOH) Interventions    Readmission Risk Interventions     No data to display

## 2022-04-22 NOTE — TOC Progression Note (Signed)
Discharge medications (7) are being stored in the main pharmacy on the ground floor until patient is ready for discharge.   

## 2022-04-22 NOTE — Progress Notes (Addendum)
Patient ID: [name], female   DOB: 08/16/1975, 46 y.o.   MRN: [id number]    Progress Note   Subjective  Day # 4 CC;N/V/GERD, chronic loose stool- intractable N/V on admit   Cath yesterday -no critical stenosis EF 25%  Looks better today , no vomiting, says still nauseated- was given solid food last pm - didn't eat    Wbc 9.5, hgb 12.1 K+ 2.8 BUN 12/ creat .62     Objective   Vital signs in last 24 hours: Temp:  [97.8 F (36.6 C)-99 F (37.2 C)] 98.6 F (37 C) (11/17 0931) Pulse Rate:  [73-106] 85 (11/17 0931) Resp:  [13-26] 20 (11/17 0931) BP: (92-150)/(66-99) 98/76 (11/17 0931) SpO2:  [89 %-100 %] 100 % (11/17 0931) Weight:  [88.8 kg] 88.8 kg (11/17 0613) Last BM Date : 04/18/22 General:    white female in NAD, family at bedside Heart:  Regular rate and rhythm; no murmurs Lungs: Respirations even and unlabored, lungs CTA bilaterally Abdomen:  Soft, nontender, nondistended normal bowel sounds. Extremities:  Without edema. Neurologic:  Alert and oriented,  grossly normal neurologically. Psych:  Cooperative. Normal mood and affect.  Intake/Output from previous day: 11/16 0701 - 11/17 0700 In: 645.5 [P.O.:600; I.V.:45.5] Out: -  Intake/Output this shift: No intake/output data recorded.  Lab Results: Recent Labs    04/20/22 0415 04/21/22 0500 04/21/22 1306 04/21/22 1318 04/22/22 0127  WBC 17.7* 12.4*  --   --  9.5  HGB 14.1 13.9 12.2 12.2 12.1  HCT 42.5 41.9 36.0 36.0 35.5*  PLT 251 271  --   --  219   BMET Recent Labs    04/20/22 0415 04/21/22 1130 04/21/22 1306 04/21/22 1318 04/22/22 0127  NA 138 139 139 139 138  K 3.3* 3.2* 3.2* 3.2* 2.8*  CL 105 104  --   --  103  CO2 23 25  --   --  27  GLUCOSE 257* 142*  --   --  145*  BUN 13 14  --   --  12  CREATININE 0.68 0.76  --   --  0.62  CALCIUM 8.2* 8.3*  --   --  8.3*   LFT Recent Labs    04/20/22 1142  PROT 6.4*  ALBUMIN 3.3*  AST 18  ALT 15  ALKPHOS 64  BILITOT 0.7   BILIDIR 0.1  IBILI 0.6   PT/INR No results for input(s): "LABPROT", "INR" in the last 72 hours.  Studies/Results: CARDIAC CATHETERIZATION  Result Date: 04/21/2022   Prox LAD lesion is 25% stenosed.   Mid LAD lesion is 30% stenosed.   Prox RCA lesion is 50% stenosed.   There is severe left ventricular systolic dysfunction.   LV end diastolic pressure is normal.   The left ventricular ejection fraction is less than 25% by visual estimate.   Hemodynamic findings consistent with pulmonary hypertension. Life style modification, smoking cessation, diet and activity.       Assessment / Plan:    #60 46 year old white female with adult onset diabetes mellitus, presenting with acute coronary syndrome NSTEMI, was also associated with intractable nausea and vomiting  There is new diagnosis of acute heart failure with EF 25 to 30% Cath yesterday no critical disease, multivessel disease, no PCI  Nausea and vomiting has significantly improved, no further vomiting, she is remains nauseated  I think nausea vomiting likely multifactoral, she has been having increase in multiple GI symptoms over the past few months  Finished the  IV Reglan Remains on as needed Zofran and IV Protonix  We will start with clear liquid diet and then advance that up over the next 24 hours as she tolerates  Stop Ozempic-very likely contributing to gastroparesis nausea and vomiting  #2 history of Crohn's disease, no evidence of active Crohn's on CT this admission-not had any GI evaluation in the past few years, that will need to be done on an outpatient basis once she has improved from a cardiac standpoint   #3 chronic anxiety  Plan; there are liquids, advance slowly as tolerated Reglan has been discontinued  IV Protonix and convert to oral Protonix once daily 40 mg Continue IV Zofran 8 mg every 6 hours as needed Was advised to discontinue Ozempic  Patient had previously been seen at our practice/Dr. Russella Dar who felt  she needed to be cared for at a tertiary care center. [facility] will not [facility] to [facility], needs referral to [facility]   Principal Problem:   Acute coronary syndrome [facility]) Active Problems:   NSTEMI (non-ST elevated myocardial infarction) (HCC)     LOS: 3 days   Kathlean Cinco  PA-C 04/22/2022, 11:07 AM

## 2022-04-22 NOTE — Telephone Encounter (Signed)
Patient Advocate Encounter   Received notification that prior authorization for Entresto 24-26MG  tablets is required.   PA submitted on 04/22/2022 Key [id number] Status is pending       Roland Earl, CPhT Pharmacy Patient Advocate Specialist [facility] Patient Advocate Team Direct Number: [phone]  Fax: [phone]

## 2022-04-22 NOTE — Discharge Summary (Addendum)
Advanced Heart Failure Team  Discharge Summary   Patient ID: [name] MRN: [id number], DOB/AGE: Aug 20, 1975 46 y.o. Admit date: 04/18/2022 D/C date:     04/22/2022   Primary Discharge Diagnoses:  1. Abdominal Pain, Nausea, Vomiting  - H/O Crohn's disease c/b perirectal fistula and abscess. Cannot take biologics d/t history of TB. 2. NSTEMI  3. Acute HFrEF 4. DMII  5. Hyperlipidemia  6. Sinus Tach  7. Headache  8. Tobacco Abuse   Hospital Course: 46 year old female with history of DMII, anxiety, HTN, obesity, Crohn's Disease, TB and tobacco abuse. Started ozempic 2 months ago.    Admitted with persistent nausea/vomiting and NSTEMIWBC on admit 26 K and moderately elevated HF Trop. Echo EF ~ 25% and RV normal. HF Team consulted. Lactic acid normal. Unclear etiology for acute heart failure. IF EF does not recover will need CMRI. Plan to repeat ECHO in 3 months.   Due to nausea/vomiting GI consulted. Keep off ozempic. Given zofran and ativan. Nausea/vomiting resolved. She then underwent cath. Preserved cardiac output and low filling pressures. Started GDMT with SGLT2i, BB, and MRA. Consider ARNi as an outpatient.   Continue zofran and protonix. Needs to remain off Ozempic. Also recommended to avoid marijuana due to possible association with hyperemesis. See below for detailed problem list. HF follow up set up. HF meds delivered to bedside.   1. Abdominal Pain, Nausea, Vomiting  - H/O Crohn's disease c/b perirectal fistula and abscess. Cannot take biologics d/t history of TB. Grandchild with recent GI virus - GI consulted. Concerned about gastroparesis in setting of poorly controlled DM. Less likely Crohn's related - CT abd negative.  -EGD will be done as an outpatient per GI    2. NSTEMI  - HS Trop 035>465>681 - EKG no ischemic changes - Risk factors coronary disease. Tobacco abuse, hyperlipidemia, and diabetes.  - LHC- Prox/Mid LAD 25-30%, RCA 50%.  - Continue aspirin and  high intensity statin - BB added.    3. Acute HFrEF - Newly diagnosed. No previous cardiac history. Unclear etiology.  - Echo EF 25-30%, RV okay, moderate MR - R/LHC- Prox LAD lesion is 25% stenosed, Mid LAD lesion is 30% stenosed, and RCA lesion is 50% stenosed. Preserved cardiac ouput and low filling pressures.  - Started on coreg 3.125 mg twice a day.  - Supp K. Started 12.5 mg spiro daily  - Restart farxiga 10 mg daily at discharge.  On prior to admit.   - Continue digoxin 0.125 mg daily - Hold off entresto. Consider as an outpatient.  - Consider CMRI.    4. DMII  -Hgb A1C 9.5 -Continue SSI per primary - On Farxiga prior to admit. Restart at discharge.    5. Hyperlipidemia  -LPA 193  -LDL 195 -On high intensity statin on admit -May need to consider PSK9i   6. Sinus Tach  -lactic acid was ok.  - Continue bb and digoxin.    7. Headache  -Developed headache after starting NTG.  -CT Head negative  Discharge Vitals: Blood pressure (!) 132/99, pulse 96, temperature 98.4 F (36.9 C), temperature source Oral, resp. rate 20, height _0  (1.626 m), weight 88.8 kg, last menstrual period 08/03/2019, SpO2 98 %, unknown if currently breastfeeding.  Labs: Lab Results  Component Value Date   WBC 9.5 04/22/2022   HGB 12.1 04/22/2022   HCT 35.5 (L) 04/22/2022   MCV 86.8 04/22/2022   PLT 219 04/22/2022    Recent Labs  Lab 04/20/22 1142  04/21/22 1130 04/22/22 0127  NA  --    < > 138  K  --    < > 2.8*  CL  --    < > 103  CO2  --    < > 27  BUN  --    < > 12  CREATININE  --    < > 0.62  CALCIUM  --    < > 8.3*  PROT 6.4*  --   --   BILITOT 0.7  --   --   ALKPHOS 64  --   --   ALT 15  --   --   AST 18  --   --   GLUCOSE  --    < > 145*   < > = values in this interval not displayed.   Lab Results  Component Value Date   CHOL 305 (H) 04/19/2022   HDL 42 04/19/2022   LDLCALC 195 (H) 04/19/2022   TRIG 342 (H) 04/19/2022   BNP (last 3 results) Recent Labs     04/18/22 1537  BNP 136.4*    ProBNP (last 3 results) No results for input(s): "PROBNP" in the last 8760 hours.   Diagnostic Studies/Procedures   CARDIAC CATHETERIZATION  Result Date: 04/21/2022   Prox LAD lesion is 25% stenosed.   Mid LAD lesion is 30% stenosed.   Prox RCA lesion is 50% stenosed.   There is severe left ventricular systolic dysfunction.   LV end diastolic pressure is normal.   The left ventricular ejection fraction is less than 25% by visual estimate.   Hemodynamic findings consistent with pulmonary hypertension. Life style modification, smoking cessation, diet and activity.    Discharge Medications   Allergies as of 04/22/2022       Reactions   Ceclor [cefaclor] Hives        Medication List     STOP taking these medications    lisinopril 10 MG tablet Commonly known as: ZESTRIL   omeprazole 40 MG capsule Commonly known as: PRILOSEC   Ozempic (1 MG/DOSE) 4 MG/3ML Sopn Generic drug: Semaglutide (1 MG/DOSE)       TAKE these medications    Accu-Chek Guide Me w/Device Kit 1 Device by Does not apply route in the morning, at noon, in the evening, and at bedtime.   Accu-Chek Guide test strip Generic drug: glucose blood 1 each by Other route in the morning, at noon, in the evening, and at bedtime. Use as instructed   acetaminophen 325 MG tablet Commonly known as: TYLENOL Take 650 mg by mouth every 6 (six) hours as needed for mild pain or moderate pain.   acetaminophen-codeine 300-30 MG tablet Commonly known as: TYLENOL #3 Take 1 tablet by mouth every 6 (six) hours as needed.   aspirin EC 81 MG tablet Take 1 tablet (81 mg total) by mouth daily. Swallow whole. Start taking on: April 23, 2022   atorvastatin 80 MG tablet Commonly known as: LIPITOR Take 80 mg by mouth at bedtime.   carvedilol 3.125 MG tablet Commonly known as: COREG Take 1 tablet (3.125 mg total) by mouth 2 (two) times daily with a meal.   dapagliflozin propanediol 10 MG  Tabs tablet Commonly known as: Farxiga Take 1 tablet (10 mg total) by mouth daily.   Dexcom G6 Sensor Misc USE 1 device AS DIRECTED   Dexcom G6 Transmitter Misc USE AS DIRECTED with dexcom   digoxin 0.125 MG tablet Commonly known as: LANOXIN Take 1 tablet (0.125 mg  total) by mouth daily. Start taking on: April 23, 2022   escitalopram 20 MG tablet Commonly known as: LEXAPRO Take 20 mg by mouth daily.   gabapentin 300 MG capsule Commonly known as: NEURONTIN Take 600-900 mg by mouth 4 (four) times daily.   insulin lispro 100 UNIT/ML KwikPen Commonly known as: HumaLOG KwikPen Max daily 60 units   Insulin Pen Needle 32G X 4 MM Misc 1 Device by Does not apply route in the morning, at noon, in the evening, and at bedtime.   metFORMIN 500 MG 24 hr tablet Commonly known as: GLUCOPHAGE-XR Take 1 tablet (500 mg total) by mouth 2 (two) times daily with a meal.   nicotine 21 mg/24hr patch Commonly known as: NICODERM CQ - dosed in mg/24 hours Place 1 patch (21 mg total) onto the skin daily. Start taking on: April 23, 2022   nitroGLYCERIN 0.4 MG SL tablet Commonly known as: NITROSTAT Place 1 tablet (0.4 mg total) under the tongue every 5 (five) minutes x 3 doses as needed for chest pain.   Omnipod 5 G6 Intro (Gen 5) Kit 1 Device by Does not apply route every other day.   Omnipod 5 G6 Pod (Gen 5) Misc 1 Device by Does not apply route every other day.   ondansetron 4 MG tablet Commonly known as: Zofran Take 1 tablet (4 mg total) by mouth every 6 (six) hours as needed for nausea or vomiting.   pantoprazole 40 MG tablet Commonly known as: Protonix Take 1 tablet (40 mg total) by mouth daily.   promethazine 25 MG tablet Commonly known as: PHENERGAN promethazine 25 mg tablet  Take 1 tablet every 4 hours by oral route as needed.   spironolactone 25 MG tablet Commonly known as: ALDACTONE Take 0.5 tablets (12.5 mg total) by mouth daily. Start taking on: April 23, 2022   Tyler Aas FlexTouch 100 UNIT/ML FlexTouch Pen Generic drug: insulin degludec Inject 32 Units into the skin daily.   zolpidem 10 MG tablet Commonly known as: AMBIEN Take 10 mg by mouth at bedtime as needed for sleep.        Disposition   The patient will be discharged in stable condition to home. Discharge Instructions     (HEART FAILURE PATIENTS) Call MD:  Anytime you have any of the following symptoms: 1) 3 pound weight gain in 24 hours or 5 pounds in 1 week 2) shortness of breath, with or without a dry hacking cough 3) swelling in the hands, feet or stomach 4) if you have to sleep on extra pillows at night in order to breathe.   Complete by: As directed    Diet - low sodium heart healthy   Complete by: As directed    Heart Failure patients record your daily weight using the same scale at the same time of day   Complete by: As directed    Increase activity slowly   Complete by: As directed        Follow-up Information     [place] Follow up on 05/02/2022.   Specialty: Cardiology Why: at 1000 Contact information: [address] [id number] [place] [id number]                  Duration of Discharge Encounter: Greater than 35 minutes   Signed, Leslie Grinder NP-C  04/22/2022, 4:18 PM  Patient seen and examined with the above-signed Advanced Practice Provider and/or Housestaff. I personally reviewed laboratory  data, imaging studies and relevant notes. I independently examined the patient and formulated the important aspects of the plan. I have edited the note to reflect any of my changes or salient points. I have personally discussed the plan with the patient and/or family.  See rounding note. Case d/w Dr. Doylene Canard. Will plan d/c today.   Leslie Bickers, MD  5:01 PM

## 2022-04-25 ENCOUNTER — Other Ambulatory Visit: Payer: Self-pay | Admitting: Internal Medicine

## 2022-05-02 ENCOUNTER — Ambulatory Visit (HOSPITAL_COMMUNITY)
Admit: 2022-05-02 | Discharge: 2022-05-02 | Disposition: A | Discharge status: Home / Self Care | Payer: Medicaid Other | Source: Ambulatory Visit | Attending: Cardiology | Admitting: Cardiology

## 2022-05-02 ENCOUNTER — Encounter (HOSPITAL_COMMUNITY): Payer: Self-pay | Admitting: Cardiology

## 2022-05-02 VITALS — BP 104/70 | HR 89 | Wt 199.4 lb

## 2022-05-02 DIAGNOSIS — Z7984 Long term (current) use of oral hypoglycemic drugs: Secondary | ICD-10-CM | POA: Insufficient documentation

## 2022-05-02 DIAGNOSIS — Z79899 Other long term (current) drug therapy: Secondary | ICD-10-CM | POA: Diagnosis not present

## 2022-05-02 DIAGNOSIS — I509 Heart failure, unspecified: Secondary | ICD-10-CM | POA: Diagnosis not present

## 2022-05-02 DIAGNOSIS — K50919 Crohn's disease, unspecified, with unspecified complications: Secondary | ICD-10-CM | POA: Diagnosis not present

## 2022-05-02 DIAGNOSIS — Z8611 Personal history of tuberculosis: Secondary | ICD-10-CM | POA: Insufficient documentation

## 2022-05-02 DIAGNOSIS — Z794 Long term (current) use of insulin: Secondary | ICD-10-CM | POA: Diagnosis not present

## 2022-05-02 DIAGNOSIS — E1140 Type 2 diabetes mellitus with diabetic neuropathy, unspecified: Secondary | ICD-10-CM | POA: Insufficient documentation

## 2022-05-02 DIAGNOSIS — F17210 Nicotine dependence, cigarettes, uncomplicated: Secondary | ICD-10-CM | POA: Insufficient documentation

## 2022-05-02 DIAGNOSIS — I5022 Chronic systolic (congestive) heart failure: Secondary | ICD-10-CM

## 2022-05-02 DIAGNOSIS — I110 Hypertensive heart disease with heart failure: Secondary | ICD-10-CM | POA: Insufficient documentation

## 2022-05-02 DIAGNOSIS — F419 Anxiety disorder, unspecified: Secondary | ICD-10-CM | POA: Insufficient documentation

## 2022-05-02 DIAGNOSIS — I214 Non-ST elevation (NSTEMI) myocardial infarction: Secondary | ICD-10-CM | POA: Diagnosis not present

## 2022-05-02 DIAGNOSIS — K5090 Crohn's disease, unspecified, without complications: Secondary | ICD-10-CM | POA: Insufficient documentation

## 2022-05-02 DIAGNOSIS — R0789 Other chest pain: Secondary | ICD-10-CM | POA: Insufficient documentation

## 2022-05-02 LAB — BASIC METABOLIC PANEL
Anion gap: 13 (ref 5–15)
BUN: 11 mg/dL (ref 6–20)
CO2: 18 mmol/L — ABNORMAL LOW (ref 22–32)
Calcium: 9.2 mg/dL (ref 8.9–10.3)
Chloride: 107 mmol/L (ref 98–111)
Creatinine, Ser: 0.6 mg/dL (ref 0.44–1.00)
GFR, Estimated: >60 mL/min (ref >60)
Glucose, Bld: 198 mg/dL — ABNORMAL HIGH (ref 70–99)
Potassium: 3.9 mmol/L (ref 3.5–5.1)
Sodium: 138 mmol/L (ref 135–145)

## 2022-05-02 LAB — BRAIN NATRIURETIC PEPTIDE: B Natriuretic Peptide: 97.4 pg/mL (ref 0.0–100.0)

## 2022-05-02 MED ORDER — METOPROLOL SUCCINATE ER 25 MG PO TB24: 12.5000 mg | ORAL_TABLET | Freq: Every day | ORAL | 3 refills | Status: DC

## 2022-05-02 MED ORDER — SPIRONOLACTONE 25 MG PO TABS: 12.5000 mg | ORAL_TABLET | Freq: Every day | ORAL | 3 refills | Status: DC

## 2022-05-02 MED ORDER — DIGOXIN 125 MCG PO TABS: 0.1250 mg | ORAL_TABLET | Freq: Every day | ORAL | 3 refills | Status: DC

## 2022-05-02 MED ORDER — ASPIRIN 81 MG PO TBEC: 81.0000 mg | DELAYED_RELEASE_TABLET | Freq: Every day | ORAL | 12 refills | Status: DC

## 2022-05-02 MED ORDER — DAPAGLIFLOZIN PROPANEDIOL 10 MG PO TABS: 10.0000 mg | ORAL_TABLET | Freq: Every day | ORAL | 3 refills | Status: DC

## 2022-05-02 MED ORDER — FUROSEMIDE 20 MG PO TABS: 40.0000 mg | ORAL_TABLET | ORAL | 3 refills | Status: DC | PRN

## 2022-05-02 MED ORDER — ATORVASTATIN CALCIUM 80 MG PO TABS: 80.0000 mg | ORAL_TABLET | Freq: Every day | ORAL | 3 refills | Status: DC

## 2022-05-02 NOTE — Patient Instructions (Signed)
STOP Coreg START Toprol XL 12.5 mg (one half tab) daily  START Lasix 40 mg as needed for swelling or shortness of breath  Labs today We will only contact you if something comes back abnormal or we need to make some changes. Otherwise no news is good news!  Your physician recommends that you schedule a follow-up appointment in: 3 weeks with the pharmacy team and in 6 weeks with Dr Gasper Lloyd  Do the following things EVERYDAY: Weigh yourself in the morning before breakfast. Write it down and keep it in a log. Take your medicines as prescribed Eat low salt foods--Limit salt (sodium) to 2000 mg per day.  Stay as active as you can everyday Limit all fluids for the day to less than 2 liters  At the [facility], you and your health needs are our priority. As part of our continuing mission to provide you with exceptional heart care, we have created designated Provider Care Teams. These Care Teams include your primary Cardiologist (physician) and Advanced Practice Providers (APPs- Physician Assistants and Nurse Practitioners) who all work together to provide you with the care you need, when you need it.   You may see any of the following providers on your designated Care Team at your next follow up: Dr Arvilla Meres Dr Marca Ancona Dr. Marcos Eke, NP Robbie Lis, [place] [facility] [place], [place] Brynda Peon, NP Karle Plumber, PharmD   Please be sure to bring in all your medications bottles to every appointment.   If you have any questions or concerns before your next appointment please send [place] a message through [place] or call our office at [phone].    TO LEAVE A MESSAGE FOR THE NURSE SELECT OPTION 2, PLEASE LEAVE A MESSAGE INCLUDING: YOUR NAME DATE OF BIRTH CALL BACK NUMBER REASON FOR CALL**this is important as we prioritize the call backs  YOU WILL RECEIVE A CALL BACK THE SAME DAY AS LONG AS YOU CALL BEFORE 4:00 PM

## 2022-05-02 NOTE — Progress Notes (Signed)
ADVANCED HEART FAILURE CLINIC NOTE  Referring Physician: [facility]*  Primary Care: [facility] Primary Cardiologist: Dr. Daniel Nones  HPI: [name] is a 46 y.o. female with T2DM, Crohn's disease, hx of TB, tobacco use that presented to [facility] in 11/23 with N/V, elevated hs-troponin and TTE w/ newly reduced LVEF (25%); coronary angiography during admission w/o obstructive CAD and RHC w/ preserved CI. She was diuresed, started on low dose GDMT and discharged home.   Interval hx:  Since discharge [name] reports feeling very "foggy headed" with no reported syncope/lightheadedness, easily fatigued and SOB. She reports having to take frequent breaks through the day due to dyspnea when vacuuming, walking >33f or taking care of her children. She also reports intermittent substernal chest pressure that is provoked by anxiety or significant exertion. She is planning to be seen by [facility]ology for further evaluation of her Crohn's disease.   Activity level/exercise tolerance:  NYHA III Orthopnea:  Sleeps on 2 pillows Paroxysmal noctural dyspnea:  No Chest pain/pressure:  yes Orthostatic lightheadedness:  no Palpitations:  no Lower extremity edema:  no Presyncope/syncope:  no Cough:  no  Past Medical History:  Diagnosis Date   Anxiety    [place]    Diabetes mellitus    DKA (diabetic ketoacidosis) (HCC)    Hypertension    Insomnia    Neuropathy     Current Outpatient Medications  Medication Sig Dispense Refill   ACCU-CHEK GUIDE test strip 1 each by Other route in the morning, at noon, in the evening, and at bedtime. Use as instructed 400 each 3   acetaminophen (TYLENOL) 325 MG tablet Take 650 mg by mouth every 6 (six) hours as needed for mild pain or moderate pain.     aspirin EC 81 MG tablet Take 1 tablet (81 mg total) by mouth daily. Swallow whole. 30 tablet 12   atorvastatin (LIPITOR) 80 MG tablet Take 80 mg by mouth  at bedtime.     Blood Glucose Monitoring Suppl (ACCU-CHEK GUIDE ME) w/Device KIT 1 Device by Does not apply route in the morning, at noon, in the evening, and at bedtime. 1 kit 0   Continuous Blood Gluc Sensor (DEXCOM G6 SENSOR) MISC USE 1 device AS DIRECTED 9 each 1   dapagliflozin propanediol (FARXIGA) 10 MG TABS tablet Take 1 tablet (10 mg total) by mouth daily. 90 tablet 2   digoxin (LANOXIN) 0.125 MG tablet Take 1 tablet (0.125 mg total) by mouth daily. 30 tablet 3   escitalopram (LEXAPRO) 20 MG tablet Take 20 mg by mouth daily.     gabapentin (NEURONTIN) 300 MG capsule Take 300 mg by mouth 3 (three) times daily.     insulin lispro (HUMALOG KWIKPEN) 100 UNIT/ML KwikPen Max daily 60 units 60 mL 2   Insulin Pen Needle 32G X 4 MM MISC 1 Device by Does not apply route in the morning, at noon, in the evening, and at bedtime. 400 each 3   metFORMIN (GLUCOPHAGE-XR) 500 MG 24 hr tablet Take 1 tablet (500 mg total) by mouth 2 (two) times daily with a meal. 180 tablet 2   nicotine (NICODERM CQ - DOSED IN MG/24 HOURS) 21 mg/24hr patch Place 1 patch (21 mg total) onto the skin daily. 28 patch 0   nitroGLYCERIN (NITROSTAT) 0.4 MG SL tablet Place 1 tablet (0.4 mg total) under the tongue every 5 (five) minutes x 3 doses as needed for chest pain. 25 tablet  3   ondansetron (ZOFRAN) 4 MG tablet Take 1 tablet (4 mg total) by mouth every 6 (six) hours as needed for nausea or vomiting. 60 tablet 1   pantoprazole (PROTONIX) 40 MG tablet Take 1 tablet (40 mg total) by mouth daily. 30 tablet 1   promethazine (PHENERGAN) 25 MG tablet promethazine 25 mg tablet  Take 1 tablet every 4 hours by oral route as needed.     spironolactone (ALDACTONE) 25 MG tablet Take 0.5 tablets (12.5 mg total) by mouth daily. 30 tablet 3   zolpidem (AMBIEN) 10 MG tablet Take 10 mg by mouth at bedtime as needed for sleep.     Insulin Disposable Pump (OMNIPOD 5 G6 INTRO, GEN 5,) KIT 1 Device by Does not apply route every other day. (Patient  not taking: Reported on 05/02/2022) 1 kit 0   No current facility-administered medications for this encounter.    Allergies  Allergen Reactions   Ceclor [Cefaclor] Hives      Social History   Socioeconomic History   Marital status: Divorced    Spouse name: Not on file   Number of children: 6   Years of education: Not on file   Highest education level: Not on file  Occupational History   Occupation: Disable  Tobacco Use   Smoking status: Every Day    Packs/day: 0.50    Types: Cigarettes   Smokeless tobacco: Never  Vaping Use   Vaping Use: Former  Substance and Sexual Activity   Alcohol use: No   Drug use: No   Sexual activity: Not on file  Other Topics Concern   Not on file  Social History Narrative   ** Merged History Encounter **       Social Determinants of Health   Financial Resource Strain: Not on file  Food Insecurity: No Food Insecurity (04/21/2022)   Hunger Vital Sign    Worried About Running Out of Food in the Last Year: Never true    Ran Out of Food in the Last Year: Never true  Transportation Needs: No Transportation Needs (04/21/2022)   PRAPARE - Hydrologist (Medical): No    Lack of Transportation (Non-Medical): No  Physical Activity: Not on file  Stress: Not on file  Social Connections: Not on file  Intimate Partner Violence: Not At Risk (04/21/2022)   Humiliation, Afraid, Rape, and Kick questionnaire    Fear of Current or Ex-Partner: No    Emotionally Abused: No    Physically Abused: No    Sexually Abused: No      Family History  Problem Relation Age of Onset   Heart disease Father    Crohn's disease Father    Colon cancer Neg Hx    Rectal cancer Neg Hx    Esophageal cancer Neg Hx     PHYSICAL EXAM: Vitals:   05/02/22 1037  BP: 104/70  Pulse: 89  SpO2: 97%   GENERAL: Well nourished, well developed, and in no apparent distress at rest.  HEENT: Negative for arcus senilis or xanthelasma. There is no  scleral icterus.  The mucous membranes are pink and moist.   NECK: Supple, No masses. Normal carotid upstrokes without bruits. No masses or thyromegaly.    CHEST: There are no chest wall deformities. There is no chest wall tenderness. Respirations are unlabored.  Lungs- CTA B/L CARDIAC:  JVP: less than 7 cm H2O         Normal S1, S2  Normal rate with  regular rhythm. No murmurs, rubs or gallops.  Pulses are 2+ and symmetrical in upper and lower extremities. no edema.  ABDOMEN: Soft, non-tender, non-distended. There are no masses or hepatomegaly. There are normal bowel sounds.  EXTREMITIES: Warm and well perfused with no cyanosis, clubbing.  LYMPHATIC: No axillary or supraclavicular lymphadenopathy.  NEUROLOGIC: Patient is oriented x3 with no focal or lateralizing neurologic deficits.  PSYCH: Patients affect is appropriate, there is no evidence of anxiety or depression.  SKIN: Warm and dry; no lesions or wounds.   DATA REVIEW  ECG: 05/02/22: NSR  ECHO: 04/19/22: LVEF 25%-30%, G2DD, normal RV function  CATH: RHC/LHC, 04/21/22:    Prox LAD lesion is 25% stenosed.   Mid LAD lesion is 30% stenosed.   Prox RCA lesion is 50% stenosed.   There is severe left ventricular systolic dysfunction.   LV end diastolic pressure is normal.   The left ventricular ejection fraction is less than 25% by visual estimate.  Low filling pressures    ASSESSMENT & PLAN:  Heart Failure with reduced EF, nonischemic, NYHA III Etiology of QJ:[id number], likely stress cardiomyopathy. No significant FH of heart failure. Will obtain CMR for future LVEF evaluation.  NYHA class / AHA Stage:III, significantly fatigued and quickly dyspneic; RHC w/ low filling pressures. Underlying Crohn's is a large component of her persistent fatigue.  Volume status & Diuretics: Euvolemic, lasix 41m prn only Vasodilators : Will plan on close follow up with pharmacy to start low dose losartan vs ARNI; I suspect she will need to  start with losartan 12.553mdue to symptomatic low BPs Beta-Blocker:d/c coreg, start toprol 12.46m246maily MRALHT:[id number].46mg32mily Cardiometabolic:farxiga 10mg03TDly Devices therapies & Valvulopathies:not indicated Advanced therapies:N/A  2. Crohn's Disease - Admitted with intractable N/V recently; [facility]   3. Hx of TB - Reports that she was briefly treated, however, could not complete treatment due to rise in LFTs and significant difficulty with associated symptoms?  - Reportedly no longer a candidate for biologics for Crohn's management due to underlying hx of untreated latent TB.  Zong Mcquarrie Advanced Heart Failure Mechanical Circulatory Support

## 2022-05-06 ENCOUNTER — Ambulatory Visit: Payer: Medicaid Other | Admitting: Podiatry

## 2022-05-12 ENCOUNTER — Other Ambulatory Visit (HOSPITAL_COMMUNITY): Payer: Self-pay

## 2022-05-12 NOTE — Progress Notes (Signed)
Advanced Heart Failure Clinic Note   Referring Physician: [facility]*  Primary Care: [facility] Primary Cardiologist: Dr. Gasper Lloyd  HPI:  [name] is a 46 y.o. female with T2DM, Crohn's disease, hx of TB, tobacco use that presented to [facility] in 04/2022 with N/V, elevated hs-troponin and TTE w/ newly reduced LVEF (25%); coronary angiography during admission w/o obstructive CAD and RHC w/ preserved CI. She was diuresed, started on low dose GDMT and discharged home.    Presented to AHF Clinic with Dr. Gasper Lloyd on 05/02/22. Since discharge [name] reported feeling very "foggy headed" with no reported syncope/lightheadedness, easily fatigued and SOB. She reported having to take frequent breaks through the day due to dyspnea when vacuuming, walking >64ft or taking care of her children. She also reported intermittent substernal chest pressure that was provoked by anxiety or significant exertion. She is planning to be seen by [facility]ology for further evaluation of her Crohn's disease.   Today she returns to HF clinic for pharmacist medication titration. At last visit with MD carvedilol was discontinued and metoprolol succinate 12.5 mg daily was initiated.  Overall she is feeling well today. Main complaint is GI issues/acid reflux. She has started to feel "chest pressure" again, but this has improved since her pantoprazole was increased to 80 mg daily. She also has significant neuropathy in her feet, which is uncomfortable and feels like she is "walking on rocks". No dizziness or lightheadedness. SBP at home has been 120-130. BP in clinic today 130/86. No palpitations. No chest pain, just chest pressure from acid reflux. Reports that she gets SOB with mild activity. She keeps her 7 grandchildren every day, which can be tiring. States she has been taking PRN Lasix every 2-3 days due to swelling in her hands and ankles. States she has had to use Lasix more  often since last visit. No LEE on exam today. No PND or orthopnea.    HF Medications: Metoprolol succinate 12.5 mg daily Spironolactone 12.5 mg daily Farxiga 10 mg daily Digoxin 0.125 mg daily Lasix 40 mg PRN  Has the patient been experiencing any side effects to the medications prescribed?  no  Does the patient have any problems obtaining medications due to transportation or finances?   No; [employer] Medicaid  Understanding of regimen: good Understanding of indications: good Potential of compliance: good Patient understands to avoid NSAIDs. Patient understands to avoid decongestants.    Pertinent Lab Values: 05/02/22: Serum creatinine 0.60, BUN 11, Potassium 3.9, Sodium 138  Vital Signs: Weight: 203.2 (last clinic weight: 199.4 lbs) Blood pressure: 130/86  Heart rate: 88   Assessment/Plan: Heart Failure with reduced EF, nonischemic, NYHA III Etiology of FT:[id number], likely stress cardiomyopathy. No significant FH of heart failure. Will obtain CMR for future LVEF evaluation.  NYHA class / AHA Stage:III, significantly fatigued and quickly dyspneic; RHC w/ low filling pressures. Underlying Crohn's is a large component of her persistent fatigue.  Volume status & Diuretics: Euvolemic on exam, continue Lasix 40mg  PRN  Vasodilators : Start Entresto 24/26 mg BID. This may help decrease the amount of PRN Lasix she is requiring. Repeat BMET at follow up in 3 weeks.  Beta-Blocker: Continue metoprolol succinate 12.5 mg daily MRA: Continue spironolactone 12.5 mg daily Cardiometabolic: Continue Farxiga 10 mg daily Devices therapies & Valvulopathies:not indicated Advanced therapies:N/A   2. Crohn's Disease - Admitted with intractable N/V recently; seen by GI at [facility] with plan to be seen at [facility] for tertiary care.    3. Hx of TB - Reports that she was briefly treated, however, could not complete treatment due to rise in LFTs and significant difficulty with associated  symptoms?  - Reportedly no longer a candidate for biologics for Crohn's management due to underlying hx of untreated latent TB.  Follow up 3 weeks with Dr. Gasper Lloyd.    Karle Plumber, PharmD, BCPS, [facility], CPP Heart Failure Clinic Pharmacist [phone]

## 2022-05-16 ENCOUNTER — Telehealth (HOSPITAL_COMMUNITY): Payer: Self-pay

## 2022-05-16 ENCOUNTER — Telehealth: Payer: Self-pay | Admitting: *Deleted

## 2022-05-16 ENCOUNTER — Ambulatory Visit (INDEPENDENT_AMBULATORY_CARE_PROVIDER_SITE_OTHER): Payer: Medicaid Other | Admitting: Podiatry

## 2022-05-16 DIAGNOSIS — L600 Ingrowing nail: Secondary | ICD-10-CM

## 2022-05-16 DIAGNOSIS — M79675 Pain in left toe(s): Secondary | ICD-10-CM

## 2022-05-16 DIAGNOSIS — E1149 Type 2 diabetes mellitus with other diabetic neurological complication: Secondary | ICD-10-CM | POA: Diagnosis not present

## 2022-05-16 DIAGNOSIS — B351 Tinea unguium: Secondary | ICD-10-CM

## 2022-05-16 DIAGNOSIS — M79674 Pain in right toe(s): Secondary | ICD-10-CM

## 2022-05-16 MED ORDER — LIDOCAINE 5 % EX PTCH: 1.0000 | MEDICATED_PATCH | CUTANEOUS | 0 refills | Status: AC

## 2022-05-16 MED ORDER — LIDOCAINE 4 % EX PTCH: 1.0000 | MEDICATED_PATCH | CUTANEOUS | 0 refills | Status: DC

## 2022-05-16 NOTE — Progress Notes (Unsigned)
Subjective:   Patient ID: [name], female   DOB: 46 y.o.   MRN: [id number]   HPI Chief Complaint  Patient presents with   Diabetes    Diabetic foot care, A1c-7.3 BG- 232, Neuropathy started 6 years ago, numbness and burning stinging, possible nail fungus TX: Gabapentin   She was taking gabapentin 2000-3000 daily but now only taking 900mg . It helped when she took higher doses but now now. Over the years the nails "pinch" and get ingrown on the sides. She has trouble wearing closed toe shoes. No drainage.   No open wounds.    ROS      Objective:  Physical Exam  ***     Assessment:  ***     Plan:  ***

## 2022-05-16 NOTE — Telephone Encounter (Signed)
Received a medical clearance form from [facility] , requesting Cardiology clearance for the following procedure: Colonoscopy, EGI. Medical clearance form was signed by Dr. Gasper Lloyd and successfully faxed to [phone] on Monday, December 11,. Form will be scanned into patients chart.

## 2022-05-16 NOTE — Telephone Encounter (Signed)
MY pharmacy is calling to get the 4 % patch(not covered by patient's insurance) changed to 5 %-box of 30,please send a new script.

## 2022-05-16 NOTE — Patient Instructions (Signed)
I have ordered a medication for you that will come from [facility] in [place]. They should be calling you to verify insurance and will mail the medication to you. If you live close by then you can go by their pharmacy to pick up the medication. Their phone number is [phone]. If you do not hear from them in the next few days, please give us a call at [phone].   

## 2022-05-23 ENCOUNTER — Telehealth: Payer: Self-pay | Admitting: Registered"

## 2022-05-23 NOTE — Telephone Encounter (Signed)
Contacted patient to see if she could come in today for pump training since I had a cancellation and time open up. Pt states she does not have vial insulin but has contacted her pharmacy and they will send something to the endocrinologist.   Patient will be notified by Marylene Land if another cancellation comes up.

## 2022-05-25 ENCOUNTER — Other Ambulatory Visit: Payer: Self-pay | Admitting: Internal Medicine

## 2022-05-26 ENCOUNTER — Ambulatory Visit (HOSPITAL_COMMUNITY)
Admission: RE | Admit: 2022-05-26 | Discharge: 2022-05-26 | Disposition: A | Discharge status: Home / Self Care | Payer: Medicaid Other | Source: Ambulatory Visit | Attending: Internal Medicine | Admitting: Internal Medicine

## 2022-05-26 VITALS — BP 130/86 | HR 88 | Wt 203.2 lb

## 2022-05-26 DIAGNOSIS — K5090 Crohn's disease, unspecified, without complications: Secondary | ICD-10-CM | POA: Insufficient documentation

## 2022-05-26 DIAGNOSIS — F17200 Nicotine dependence, unspecified, uncomplicated: Secondary | ICD-10-CM | POA: Diagnosis not present

## 2022-05-26 DIAGNOSIS — Z8611 Personal history of tuberculosis: Secondary | ICD-10-CM | POA: Insufficient documentation

## 2022-05-26 DIAGNOSIS — I5022 Chronic systolic (congestive) heart failure: Secondary | ICD-10-CM

## 2022-05-26 DIAGNOSIS — I110 Hypertensive heart disease with heart failure: Secondary | ICD-10-CM | POA: Insufficient documentation

## 2022-05-26 DIAGNOSIS — I5020 Unspecified systolic (congestive) heart failure: Secondary | ICD-10-CM | POA: Insufficient documentation

## 2022-05-26 DIAGNOSIS — E119 Type 2 diabetes mellitus without complications: Secondary | ICD-10-CM | POA: Diagnosis not present

## 2022-05-26 MED ORDER — ENTRESTO 24-26 MG PO TABS: 1.0000 | ORAL_TABLET | Freq: Two times a day (BID) | ORAL | 11 refills | Status: DC

## 2022-05-26 NOTE — Patient Instructions (Signed)
It was a pleasure seeing you today!  MEDICATIONS: -We are changing your medications today -Start Entresto 24/26 mg (1 tablet) twice daily -Call if you have questions about your medications.   NEXT APPOINTMENT: Return to clinic in 3 weeks with Dr. Gasper Lloyd.  In general, to take care of your heart failure: -Limit your fluid intake to 2 Liters (half-gallon) per day.   -Limit your salt intake to ideally 2-3 grams (2000-3000 mg) per day. -Weigh yourself daily and record, and bring that "weight diary" to your next appointment.  (Weight gain of 2-3 pounds in 1 day typically means fluid weight.) -The medications for your heart are to help your heart and help you live longer.   -Please contact us before stopping any of your heart medications.  Call the clinic at [phone] with questions or to reschedule future appointments.

## 2022-06-01 ENCOUNTER — Telehealth (HOSPITAL_COMMUNITY): Payer: Self-pay | Admitting: *Deleted

## 2022-06-01 ENCOUNTER — Emergency Department (HOSPITAL_COMMUNITY)
Admission: EM | Admit: 2022-06-01 | Discharge: 2022-06-01 | Discharge status: Against Medical Advice | Payer: Medicaid Other

## 2022-06-01 NOTE — Telephone Encounter (Signed)
Pt called c/o burning chest pain/pressure radiating to her neck and back. Pt advised to go to the emergency room. Pt agreeable with plan.

## 2022-06-01 NOTE — ED Notes (Signed)
No answer for triage call

## 2022-06-07 ENCOUNTER — Telehealth: Payer: Self-pay

## 2022-06-07 MED ORDER — INSULIN LISPRO 100 UNIT/ML IJ SOLN: INTRAMUSCULAR | 3 refills | Status: DC

## 2022-06-07 NOTE — Telephone Encounter (Signed)
Patient has pump training on 06/13/22 and needs insulin sent to pharmacy

## 2022-06-13 ENCOUNTER — Encounter (HOSPITAL_COMMUNITY): Payer: Medicaid Other | Admitting: Cardiology

## 2022-06-13 ENCOUNTER — Encounter: Payer: Medicaid Other | Attending: Internal Medicine | Admitting: Registered"

## 2022-06-13 ENCOUNTER — Encounter: Payer: Self-pay | Admitting: Registered"

## 2022-06-13 DIAGNOSIS — E1169 Type 2 diabetes mellitus with other specified complication: Secondary | ICD-10-CM | POA: Insufficient documentation

## 2022-06-13 DIAGNOSIS — Z794 Long term (current) use of insulin: Secondary | ICD-10-CM | POA: Insufficient documentation

## 2022-06-14 ENCOUNTER — Encounter (HOSPITAL_COMMUNITY): Payer: Self-pay | Admitting: Cardiology

## 2022-06-14 ENCOUNTER — Ambulatory Visit (HOSPITAL_COMMUNITY)
Admission: RE | Admit: 2022-06-14 | Discharge: 2022-06-14 | Disposition: A | Discharge status: Home / Self Care | Payer: Medicaid Other | Source: Ambulatory Visit | Attending: Cardiology | Admitting: Cardiology

## 2022-06-14 VITALS — BP 94/50 | HR 109 | Wt 203.4 lb

## 2022-06-14 DIAGNOSIS — I5022 Chronic systolic (congestive) heart failure: Secondary | ICD-10-CM

## 2022-06-14 LAB — BASIC METABOLIC PANEL
Anion gap: 12 (ref 5–15)
BUN: 10 mg/dL (ref 6–20)
CO2: 20 mmol/L — ABNORMAL LOW (ref 22–32)
Calcium: 8.9 mg/dL (ref 8.9–10.3)
Chloride: 103 mmol/L (ref 98–111)
Creatinine, Ser: 0.55 mg/dL (ref 0.44–1.00)
GFR, Estimated: >60 mL/min (ref >60)
Glucose, Bld: 240 mg/dL — ABNORMAL HIGH (ref 70–99)
Potassium: 3.9 mmol/L (ref 3.5–5.1)
Sodium: 135 mmol/L (ref 135–145)

## 2022-06-14 LAB — BRAIN NATRIURETIC PEPTIDE: B Natriuretic Peptide: 5.9 pg/mL (ref 0.0–100.0)

## 2022-06-14 NOTE — Patient Instructions (Signed)
STOP Digoxin  Labs done today, your results will be available in MyChart, we will contact you for abnormal readings.  Your physician has requested that you have an echocardiogram. Echocardiography is a painless test that uses sound waves to create images of your heart. It provides your doctor with information about the size and shape of your heart and how well your heart's chambers and valves are working. This procedure takes approximately one hour. There are no restrictions for this procedure. Please do NOT wear cologne, perfume, aftershave, or lotions (deodorant is allowed). Please arrive 15 minutes prior to your appointment time.  Your physician recommends that you schedule a follow-up appointment in: 4 months ( May 2024) ** please call the office in March to arrange your follow up appointment **  If you have any questions or concerns before your next appointment please send [place] a message through Center or call our office at [phone].    TO LEAVE A MESSAGE FOR THE NURSE SELECT OPTION 2, PLEASE LEAVE A MESSAGE INCLUDING: YOUR NAME DATE OF BIRTH CALL BACK NUMBER REASON FOR CALL**this is important as we prioritize the call backs  YOU WILL RECEIVE A CALL BACK THE SAME DAY AS LONG AS YOU CALL BEFORE 4:00 PM  At the [facility], you and your health needs are our priority. As part of our continuing mission to provide you with exceptional heart care, we have created designated Provider Care Teams. These Care Teams include your primary Cardiologist (physician) and Advanced Practice Providers (APPs- Physician Assistants and Nurse Practitioners) who all work together to provide you with the care you need, when you need it.   You may see any of the following providers on your designated Care Team at your next follow up: Dr Glori Bickers Dr Loralie Champagne Dr. Roxana Hires, NP Lyda Jester, [place] [facility] [place], [place] Forestine Na, NP Audry Riles, PharmD   Please be sure to bring in all your medications bottles to every appointment.

## 2022-06-14 NOTE — Progress Notes (Addendum)
ADVANCED HEART FAILURE CLINIC NOTE  Referring Physician: Carron Curie Urge*  Primary Care: Shamleffer, Melanie Crazier, MD Primary Cardiologist: Dr. Daniel Nones  HPI: [name] is a 47 y.o. female with T2DM, Crohn's disease, hx of TB, tobacco use that presented to [facility] in 11/23 with N/V, elevated hs-troponin and TTE w/ newly reduced LVEF (25%); coronary angiography during admission w/o obstructive CAD and RHC w/ preserved CI. She was diuresed, started on low dose GDMT and discharged home.   Interval hx:  She continues to feel quickly fatigued and reports of upper back pain, neck pain and at times chest pressure.  She still remains fairly concerned about her GI problems and believes that she has gastroparesis leading to a lot of her symptoms.  From a heart failure standpoint she has been fairly stable with minimal lightheadedness  Activity level/exercise tolerance:  NYHA III but limited predominantly by underlying inflammatory disease and pain. Orthopnea:  Sleeps on 2 pillows Paroxysmal noctural dyspnea:  No Chest pain/pressure:  yes Orthostatic lightheadedness:  no Palpitations:  no Lower extremity edema:  no Presyncope/syncope:  no Cough:  no  Past Medical History:  Diagnosis Date   Anxiety    Crohn's disease ([place])    Diabetes mellitus    DKA (diabetic ketoacidosis) (HCC)    Hypertension    Insomnia    Neuropathy     Current Outpatient Medications  Medication Sig Dispense Refill   ACCU-CHEK GUIDE test strip 1 each by Other route in the morning, at noon, in the evening, and at bedtime. Use as instructed 400 each 3   acetaminophen (TYLENOL) 325 MG tablet Take 650 mg by mouth every 6 (six) hours as needed for mild pain or moderate pain.     aspirin EC 81 MG tablet Take 1 tablet (81 mg total) by mouth daily. Swallow whole. 30 tablet 12   atorvastatin (LIPITOR) 80 MG tablet Take 1 tablet (80 mg total) by mouth at bedtime. 30 tablet 3   Blood Glucose Monitoring  Suppl (ACCU-CHEK GUIDE ME) w/Device KIT 1 Device by Does not apply route in the morning, at noon, in the evening, and at bedtime. 1 kit 0   Continuous Blood Gluc Sensor (DEXCOM G6 SENSOR) MISC USE 1 device AS DIRECTED 9 each 1   dapagliflozin propanediol (FARXIGA) 10 MG TABS tablet Take 1 tablet (10 mg total) by mouth daily. 30 tablet 3   digoxin (LANOXIN) 0.125 MG tablet Take 1 tablet (0.125 mg total) by mouth daily. 30 tablet 3   escitalopram (LEXAPRO) 20 MG tablet Take 20 mg by mouth daily.     furosemide (LASIX) 20 MG tablet Take 2 tablets (40 mg total) by mouth as needed. 30 tablet 3   gabapentin (NEURONTIN) 300 MG capsule Take 300 mg by mouth 3 (three) times daily.     Insulin Disposable Pump (OMNIPOD 5 G6 INTRO, GEN 5,) KIT 1 Device by Does not apply route every other day. 1 kit 0   Insulin Disposable Pump (OMNIPOD 5 G6 POD, GEN 5,) MISC Use as directed EVERY OTHER DAY 15 each 3   insulin lispro (HUMALOG) 100 UNIT/ML injection Max daily 60 units per pump 60 mL 3   lidocaine (LIDODERM) 5 % Place 1 patch onto the skin daily. Remove & Discard patch within 12 hours or as directed by MD 30 patch 0   metFORMIN (GLUCOPHAGE-XR) 500 MG 24 hr tablet Take 1 tablet (500 mg total) by mouth 2 (two) times daily with a meal.  180 tablet 2   metoprolol succinate (TOPROL XL) 25 MG 24 hr tablet Take 0.5 tablets (12.5 mg total) by mouth daily. 15 tablet 3   nitroGLYCERIN (NITROSTAT) 0.4 MG SL tablet Place 1 tablet (0.4 mg total) under the tongue every 5 (five) minutes x 3 doses as needed for chest pain. 25 tablet 3   ondansetron (ZOFRAN) 4 MG tablet Take 1 tablet (4 mg total) by mouth every 6 (six) hours as needed for nausea or vomiting. 60 tablet 1   pantoprazole (PROTONIX) 40 MG tablet Take 1 tablet (40 mg total) by mouth daily. 30 tablet 1   promethazine (PHENERGAN) 25 MG tablet promethazine 25 mg tablet  Take 1 tablet every 4 hours by oral route as needed.     sacubitril-valsartan (ENTRESTO) 24-26 MG Take 1  tablet by mouth 2 (two) times daily. 60 tablet 11   spironolactone (ALDACTONE) 25 MG tablet Take 0.5 tablets (12.5 mg total) by mouth daily. 30 tablet 3   zolpidem (AMBIEN) 10 MG tablet Take 10 mg by mouth at bedtime as needed for sleep.     No current facility-administered medications for this encounter.    Allergies  Allergen Reactions   Ceclor [Cefaclor] Hives      Social History   Socioeconomic History   Marital status: Divorced    Spouse name: Not on file   Number of children: 6   Years of education: Not on file   Highest education level: Not on file  Occupational History   Occupation: Disable  Tobacco Use   Smoking status: Every Day    Packs/day: 0.50    Types: Cigarettes   Smokeless tobacco: Never  Vaping Use   Vaping Use: Former  Substance and Sexual Activity   Alcohol use: No   Drug use: No   Sexual activity: Not on file  Other Topics Concern   Not on file  Social History Narrative   ** Merged History Encounter **       Social Determinants of Health   Financial Resource Strain: Not on file  Food Insecurity: No Food Insecurity (04/21/2022)   Hunger Vital Sign    Worried About Running Out of Food in the Last Year: Never true    Ran Out of Food in the Last Year: Never true  Transportation Needs: No Transportation Needs (04/21/2022)   PRAPARE - Administrator, Civil Service (Medical): No    Lack of Transportation (Non-Medical): No  Physical Activity: Not on file  Stress: Not on file  Social Connections: Not on file  Intimate Partner Violence: Not At Risk (04/21/2022)   Humiliation, Afraid, Rape, and Kick questionnaire    Fear of Current or Ex-Partner: No    Emotionally Abused: No    Physically Abused: No    Sexually Abused: No      Family History  Problem Relation Age of Onset   Heart disease Father    Crohn's disease Father    Colon cancer Neg Hx    Rectal cancer Neg Hx    Esophageal cancer Neg Hx     PHYSICAL EXAM: Vitals:    06/14/22 0932  BP: (!) 94/50  Pulse: (!) 109  SpO2: 99%   GENERAL: Anxious HEENT: Negative for arcus senilis or xanthelasma. There is no scleral icterus.  The mucous membranes are pink and moist.   NECK: Supple, No masses. Normal carotid upstrokes without bruits. No masses or thyromegaly.    CHEST: There are no chest wall deformities. There  is no chest wall tenderness. Respirations are unlabored.  Lungs-CTA bilaterally CARDIAC:  JVP: less than 7 cm H2O         Normal S1, S2  Normal rate with regular rhythm. No murmurs, rubs or gallops.  Pulses are 2+ and symmetrical in upper and lower extremities. no edema.  ABDOMEN: Soft, non-tender, non-distended. There are no masses or hepatomegaly. There are normal bowel sounds.  EXTREMITIES: Warm with 1+ pitting edema in lower extremities. LYMPHATIC: No axillary or supraclavicular lymphadenopathy.  NEUROLOGIC: Patient is oriented x3 with no focal or lateralizing neurologic deficits.  PSYCH: Patients affect is appropriate, there is no evidence of anxiety or depression.  SKIN: Warm and dry; no lesions or wounds.   DATA REVIEW  ECG: 05/02/22: NSR  ECHO: 04/19/22: LVEF 25%-30%, G2DD, normal RV function 06/14/2022: Bedside ultrasound today in clinic with normal LV function and preserved RV function.  CATH: RHC/LHC, 04/21/22:    Prox LAD lesion is 25% stenosed.   Mid LAD lesion is 30% stenosed.   Prox RCA lesion is 50% stenosed.   There is severe left ventricular systolic dysfunction.   LV end diastolic pressure is normal.   The left ventricular ejection fraction is less than 25% by visual estimate.  Low filling pressures    ASSESSMENT & PLAN:  Heart Failure with reduced EF, nonischemic, NYHA IIb Etiology of VZ:DGLOVFIEPPI, likely stress cardiomyopathy. No significant FH of heart failure. Will obtain CMR for future LVEF evaluation.  NYHA class / AHA Stage:III, I believe her underlying gastroparesis and Crohn's/inflammatory disease is a large  component of her functional limitations. Volume status & Diuretics: Euvolemic, lasix 40mg  prn only Vasodilators : Continue Entresto 24/26 mg twice daily Beta-Blocker: Continue Toprol 12.5 mg daily. D/C digoxin with improvement in LVEF.  12.5mg  daily Cardiometabolic:farxiga 10mg  daily Devices therapies & Valvulopathies:not indicated Advanced therapies: I performed a bedside echo today that showed normal LVEF and normal right ventricular function. Obtain official TTE.  2. Crohn's Disease -Following at [facility] -From a cardiac standpoint she is okay to proceed with upper and lower endoscopy.  3. Hx of TB - Reports that she was briefly treated, however, could not complete treatment due to rise in LFTs and significant difficulty with associated symptoms?  - Reportedly no longer a candidate for biologics for Crohn's management due to underlying hx of untreated latent TB.  [name] Advanced Heart Failure Mechanical Circulatory Support

## 2022-06-15 NOTE — Progress Notes (Signed)
Start time:  1000   End time:  1150  Patient is here today 06/13/22 for training on the Omnipod 5 Insulin Pump.  It was confirmed that the patient was aware of the following: Blood glucose (BG) testing Treating hypoglycemia Hyperglycemia Carbohydrate counting   (Patient will not be using carbohydrate counting with use of this pump.) Sick day management  Patient was instructed on the following: Have the following supplies available to be prepared: Omnipod PDM and pods Insulin and syringe Blood glucose meter, strips, lancets, lancing device Glucose tablets/fast acting source of carbohydrate/Glucagon  Supply Reorder [place] [phone] ext [phone] Reorder - info/contact number When to reorder (when last box of pods is opened)  System Overview Communication process/distance Storage guidelines Diagnostic tests (CT scans/MRI/Xrays) Travel guidelines  Pod: Fill port/adhesive/needle cap/pink slide insert/Waterproof  PDM Battery/button layout/wireless updates (software updates)  PDM Settings Pump Therapy Order Form with settings below Basic settings - Personalized lock screen/time/time zone/date/date format Basal settings Max basal 2 Basal rates 1.0 Temp basal   Bolus settings Target Blood glucose  120 Insulin to carb (IC) ratio  1 Correction factor 20 mg/dL/unit for BG above 140 mg/dL Minimum blood glucose for bolus calculations 70 mg/dL Reverse correction Duration of insulin action 4 hrs Extended bolus Max bolus Patient was able to accurately enter pump settings into PDM.  Pod Activation Change Pod  Room temperature insulin Fill syringe - min/max amounts DO NOT prefill Pod Site selection/rotation & prep Automated cannula insertion - check infusion site/viewing window & pink slide insert Blood glucose reminder 1.5 hours after insertion When to change POD Patient was able to place pod and view insert.  Home Screen Status Bar, Menu Icon,  Notification/Alarms Tabs Dashboard - IOB  Basal (Temp Basal if Temp Basal running) Pod Info - View Pod detail Last Bolus, Last BG Bolus button  Menu icon PDM function - Temp Basal, Pod, Enter BG, Suspend Manage Programs & Presets - Basal programs, temp basal presets, bolus presets Food Library ([employer] app, other tools for carbohydrate counting) History - Notifications & Alarms, Insulin & BG History Settings - PDM Device, Pod sites, Reminders, Blood Glucose, Basal & Temp Basal, Bolus About  Advanced Features (to be discussed at a further date based on patient needs) Extended bolus Temp basal rate Additional basal programs Presets - Temp basal/Bolus presets  Troubleshooting Hypoglycemia Sick day management resource Hyperglycemia & Ketones  Notifications & Alarms Custom reminders Pod Expiration alert Low Reservoir alert 20 units  Advisory & Hazard Alarms Advisory alarms - intermittent tones - response required Hazard alarm - Continuous vibration, and Tone - urgent attention required - Pod Expired, Empty Reservoir, Occlusion, Pod Error, Auto Off, PDM Error, System Error  Ongoing Success Gl[place]o invitation provided Omnipod app(s) Register for [employer] 24/7 Customer Care  [phone] Reviewed User Guide Showed patient Customer's Bill of Rights and Responsibilities and instructed them to review.  Handouts Provided: Insulin Pump Packet  Patient to contact me via MyChart or phone.

## 2022-06-17 ENCOUNTER — Telehealth: Payer: Self-pay | Admitting: Registered"

## 2022-06-17 NOTE — Telephone Encounter (Signed)
Pt started Omnipod 1/8. Per [place], patient BG levels are staying above 200 - high 300s. Pt is using manual mode.  Pt was instructed to change her basal rate from 1.0 u/hr to 1.1 u/hr. Pt made change and verbalized understanding.   Pt states the bolus calculator was not suggesting a correction bolus with insulin at 283, nor did she see amount of "insulin on board" in the bolus calculator. Patient was not able to stay on the phone to troubleshoot the calculator function.  Pt has appt with [facility] for follow-up on Monday. Her referring MD was contacted to update on insulin pump therapy change.

## 2022-06-20 ENCOUNTER — Ambulatory Visit: Payer: Medicaid Other | Admitting: Registered"

## 2022-06-20 DIAGNOSIS — E1169 Type 2 diabetes mellitus with other specified complication: Secondary | ICD-10-CM

## 2022-06-20 DIAGNOSIS — Z794 Long term (current) use of insulin: Secondary | ICD-10-CM | POA: Diagnosis present

## 2022-06-20 NOTE — Progress Notes (Signed)
Virtual Visit via Video Note  I connected with [name] on 06/20/22 at  3:30 PM EST by a video enabled telemedicine application and verified that I am speaking with the correct person using two identifiers.  Location: Patient: home Provider: [facility]   I discussed the limitations of evaluation and management by telemedicine and the availability of in person appointments. The patient expressed understanding and agreed to proceed.  Diabetes Self-Management Education  Visit Type:  follow-up after insulin pump start  06/20/2022  [name], identified by name and date of birth, is a 47 y.o. female with a diagnosis of Diabetes:Type 2 .  ASSESSMENT Pt reports daily vomiting and has limitations on how much fluid she can drink due to heart condition. Pt states Crohn's disease and gastroparesis complicates her management of diabetes.  Individualized Plan for Diabetes Self-Management Training:   Learning Objective:  Patient will have a greater understanding of diabetes self-management. Patient education plan is to attend individual and/or group sessions per assessed needs and concerns.   Plan:  06/13/22 initial pump settings: Basal rate 1.0 u/hr x 24 hrs; Max basal setting 2u/hr basal 1.0u/hr; Target 120-140mg /dL; 1:1 #10 g/meal; sensitivity 20; Max bolus 30 units. 06/17/22 [facility] instructed Pt to increase basal by 10% to 1.1 u/hr due to TIR at 4%, no hypoglycemia. 06/20/22 Per Dr. Kelton Pillar instructions via MyChart inbasket, Pt was instructed to increase basal rate to 1.3 u/hr  Because appears patient has not been using the basal function correctly, discussed keeping meal bolus at 10 before increasing to 12 units and let Dr. Kelton Pillar re-evaluate glooko data for changes to insulin pump settings next week.  Patient instructions Per Dr. Kelton Pillar instructions, increase basal rate to 1.3 u/hr  Keep controller setting in Manual mode for now while  getting the basal rate closer to appropriate rate per hour  Start giving yourself meal bolus 10 min before eating, especially for easily digested carbohydrates such as fruit. Also, but your meal bolus in the top field (grams). Discuss correction bolus with Dr. Kelton Pillar at visit next week.  Return for follow-up visit with [facility] (me) as needed.  Follow Up Instructions: I discussed the assessment and treatment plan with the patient. The patient was provided an opportunity to ask questions and all were answered. The patient agreed with the plan and demonstrated an understanding of the instructions.   The patient was advised to call back or seek an in-person evaluation if the symptoms worsen or if the condition fails to improve as anticipated.  I provided 25 minutes of non-face-to-face time during this encounter.  Levie Heritage, RD, LDN, [facility]

## 2022-06-20 NOTE — Patient Instructions (Addendum)
Per Dr. Kelton Pillar instructions, increase basal rate to 1.3 u/hr  Keep controller setting in Manual mode for now while getting the basal rate closer to appropriate rate per hour  Start giving yourself meal bolus 10 min before eating, especially for easily digested carbohydrates such as fruit. Also, but your meal bolus in the top field (grams). Discuss correction bolus with Dr. Kelton Pillar.  Return for follow-up visit with [facility] (me) as needed.

## 2022-06-27 ENCOUNTER — Ambulatory Visit: Payer: Medicaid Other | Admitting: Internal Medicine

## 2022-06-27 NOTE — Progress Notes (Signed)
Name: [name]  Age/ Sex: 47 y.o., female   MRN/ DOB: [id number], July 27, 1975     PCP: Cloyd Stagers, MD   Reason for Endocrinology Evaluation: Type 2 Diabetes Mellitus  Initial Endocrine Consultative Visit: 10/20/2020    PATIENT IDENTIFIER: [name] is a 47 y.o. female with a past medical history of T2DM, Dyslipidemia, Crohn's disease, CAD, CHF  and HTN. The patient has followed with Endocrinology clinic since 10/20/2020 for consultative assistance with management of her diabetes.  DIABETIC HISTORY:  [name] was diagnosed with DM as gestational diabetes at age 86, by age 48 she was started on Metformin . Insulin was started in her 30's. Her hemoglobin A1c has ranged from 7.9% in 2012, peaking at 12.8% in 2022  ON her initial visit to our clinic  she had an A1c of 12.8 % . She had Metformin, lantus and Humalog on her list but had not taken insulin in 2 months by then. We continued Metformin, started [place] and restarted insulin .  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 She checks her blood sugars multiple times daily through CGM.Marland Kitchen The patient has not had hypoglycemic episodes since the last clinic visit. She did not bring the meter today   She has Crohn's with chronic diarrhea -is [facility] She had an NESTMI and CHF 04/2022 She was seen by cardiology 05/19/2022 Was seen by podiatry on 05/16/2022    This patient with type 2 diabetes is treated with omnipod (insulin pump). During the visit the pump basal and bolus doses were reviewed including  carb/insulin rations and supplemental doses. The clinical list was updated. The glucose meter download was reviewed in detail to determine if the current pump settings are providing the best glycemic control without excessive hypoglycemia.  Pump and meter download:    Pump   OmniPod Settings   Insulin type   Humalog   Basal rate       0000 1.3 u/h               I:C ratio       0000 1:1 Enter #12                  Sensitivity       0000  20      Goal       0000  120             Type & Model of Pump: OmniPod Insulin Type: Currently using Humalog.  Body mass index is 35.36 kg/m.  PUMP STATISTICS: Average BG: 240 Average Daily Carbs (g): 24.8 Average Total Daily Insulin: 57.5 Average Daily Basal: 29.9 (52%) Average Daily Bolus: 27.6 (48%)   CONTINUOUS GLUCOSE MONITORING RECORD INTERPRETATION    Dates of Recording: 1/9 - 06/28/2022  Sensor description: Dexcom  Results statistics:   CGM use % of time 93%  Average and SD 239/51  Time in range    13    %  % Time Above 180 46  % Time above 250  41  % Time Below target 0   Glycemic patterns summary: Hyperglycemia noted during the day and during the night  Hyperglycemic episodes all day and night, worse postprandial  Hypoglycemic episodes occurred N/A  Overnight periods: Remains high   HOME DIABETES REGIMEN:  Metformin 500 mg , 1 tablet Twice daily  Farxiga 10 mg, 1 tablet in the morning  Humalog      Statin: yes ACE-I/ARB: yes Prior Diabetic Education: yes     DIABETIC COMPLICATIONS: Microvascular complications:  neuropathy Denies: CKD, retinopathy Last Eye Exam: Completed 10/2020  Macrovascular complications:   Denies: CAD, CVA, PVD   HISTORY:  Past Medical History:  Past Medical History:  Diagnosis Date   Anxiety    Crohn's disease (HCC)    Diabetes mellitus    DKA (diabetic ketoacidosis) (HCC)    Hypertension    Insomnia    Neuropathy    Past Surgical History:  Past  Surgical History:  Procedure Laterality Date   RECTAL SURGERY     RIGHT/LEFT HEART CATH AND CORONARY ANGIOGRAPHY N/A 04/21/2022   Procedure: RIGHT/LEFT HEART CATH AND CORONARY ANGIOGRAPHY;  Surgeon: Orpah Cobb, MD;  [facility];  Service: Cardiovascular;  Laterality: N/A;   Social History:  reports that she has been smoking cigarettes. She has been smoking an average of .5 packs per day. She has never used smokeless tobacco. She reports that she does not drink alcohol and does not use drugs. Family History:  Family History  Problem Relation Age of Onset   Heart disease Father    Crohn's disease Father    Colon cancer Neg Hx    Rectal cancer Neg Hx    Esophageal cancer Neg Hx      HOME MEDICATIONS: Allergies as of 06/28/2022       Reactions   Ceclor [cefaclor] Hives        Medication List        Accurate as of June 28, 2022 11:59 AM. If you have any questions, ask your nurse or doctor.          Accu-Chek Guide Me w/Device Kit 1 Device by Does not apply route in the morning, at noon, in the evening, and at bedtime.   Accu-Chek Guide test strip Generic drug: glucose blood 1 each by Other route in the morning, at noon, in the evening, and at bedtime. Use as instructed   acetaminophen 325 MG tablet Commonly known as: TYLENOL Take 650 mg by mouth every 6 (six) hours as needed for mild pain or moderate pain.   aspirin EC 81 MG tablet Take 1 tablet (81 mg total) by mouth daily. Swallow whole.   atorvastatin 80 MG tablet Commonly known as: LIPITOR Take 1 tablet (80 mg total) by mouth at bedtime.   dapagliflozin propanediol 10 MG Tabs tablet Commonly known as: Farxiga Take 1 tablet (10 mg total) by mouth daily.   Dexcom G6 Sensor Misc USE 1 device AS DIRECTED   Entresto 24-26 MG Generic drug: sacubitril-valsartan Take 1 tablet by mouth 2 (two) times daily.   escitalopram 20 MG tablet Commonly known as: LEXAPRO Take 20 mg by mouth  daily.   furosemide 20 MG tablet Commonly known as: Lasix Take 2 tablets (40 mg total) by mouth as needed.   gabapentin 300 MG capsule Commonly known as: NEURONTIN Take 300 mg by mouth 3 (three) times daily.   insulin lispro 100 UNIT/ML injection Commonly known as: HumaLOG Max daily 60 units per pump  lidocaine 5 % Commonly known as: Lidoderm Place 1 patch onto the skin daily. Remove & Discard patch within 12 hours or as directed by MD   metFORMIN 500 MG 24 hr tablet Commonly known as: GLUCOPHAGE-XR Take 1 tablet (500 mg total) by mouth 2 (two) times daily with a meal.   metoprolol succinate 25 MG 24 hr tablet Commonly known as: Toprol XL Take 0.5 tablets (12.5 mg total) by mouth daily.   nitroGLYCERIN 0.4 MG SL tablet Commonly known as: NITROSTAT Place 1 tablet (0.4 mg total) under the tongue every 5 (five) minutes x 3 doses as needed for chest pain.   Omnipod 5 G6 Intro (Gen 5) Kit 1 Device by Does not apply route every other day.   Omnipod 5 G6 Pod (Gen 5) Misc Use as directed EVERY OTHER DAY   ondansetron 4 MG tablet Commonly known as: Zofran Take 1 tablet (4 mg total) by mouth every 6 (six) hours as needed for nausea or vomiting.   pantoprazole 40 MG tablet Commonly known as: Protonix Take 1 tablet (40 mg total) by mouth daily.   promethazine 25 MG tablet Commonly known as: PHENERGAN promethazine 25 mg tablet  Take 1 tablet every 4 hours by oral route as needed.   spironolactone 25 MG tablet Commonly known as: ALDACTONE Take 0.5 tablets (12.5 mg total) by mouth daily.   zolpidem 10 MG tablet Commonly known as: AMBIEN Take 10 mg by mouth at bedtime as needed for sleep.         OBJECTIVE:   Vital Signs: BP 102/70   Pulse 80   Ht 5\' 4"  (1.626 m)   Wt 206 lb (93.4 kg)   LMP 08/03/2019 (Approximate)   SpO2 97%   BMI 35.36 kg/m   Wt Readings from Last 3 Encounters:  06/28/22 206 lb (93.4 kg)  06/14/22 203 lb 6.4 oz (92.3 kg)  05/26/22 203 lb  3.2 oz (92.2 kg)     Exam: General: Pt appears well and is in NAD  Lungs: Clear with good BS bilat   Heart: RRR  Extremities: No pretibial edema.   Neuro: MS is good with appropriate affect, pt is alert and Ox3   DM foot exam:03/04/2022   The skin of the feet is  without sores or ulcerations. The pedal pulses are 2+ on right and 2+ on left. The sensation is absent  to a screening 5.07, 10 gram monofilament bilaterally        DATA REVIEWED:  Lab Results  Component Value Date   HGBA1C 12.8 (A) 10/20/2020   HGBA1C (H) 08/05/2010    Latest Reference Range & Units 02/22/21 08:27  Sodium 135 - 145 mmol/L 141  Potassium 3.5 - 5.1 mmol/L 3.8  Chloride 98 - 111 mmol/L 103  CO2 22 - 32 mmol/L 25  Glucose 70 - 99 mg/dL 217 (H)  BUN 6 - 20 mg/dL 13  Creatinine 0.44 - 1.00 mg/dL 0.50  Calcium 8.9 - 10.3 mg/dL 9.1  Anion gap 5 - 15  13  Phosphorus 2.5 - 4.6 mg/dL 3.7  Albumin 3.5 - 5.0 g/dL 4.0  GFR, Estimated >60 mL/min >60  (H): Data is abnormally high  ASSESSMENT / PLAN / RECOMMENDATIONS:   1) Type 2 Diabetes Mellitus, Poorly controlled, With neuropathic complications and microabuminuria - Most recent A1c of 9.5 %. Goal A1c < 7.0 %.     -Patient continues with hyperglycemia despite being on the OmniPod -I have reviewed her Dexcom and [employer], she  was in the manual mode, I increased her basal rate as below, she was advised to enter # 12 g with a meal and 6 g with a snack -She was switched back to automatic mode - Intolerant to higher doses of metformin  MEDICATIONS: Continue Metformin 500 mg , 1 tablet Twice daily  Continue  Farxiga 10  mg, 1 tablet in the morning      Pump   OmniPod Settings   Insulin type   Humalog   Basal rate       0000 1.55u/h               I:C ratio       0000 1:1 #12 with a meal, #6 with snack                  Sensitivity       0000  20      Goal       0000  120          EDUCATION / INSTRUCTIONS: BG monitoring  instructions: Patient is instructed to check her blood sugars 3 times a day, before each meal . Call [facility] [facility] if: BG persistently < 70  I reviewed the Rule of 15 for the treatment of hypoglycemia in detail with the patient. Literature supplied.   2) Diabetic complications:  Eye: Does not have known diabetic retinopathy.  Neuro/ Feet: Does have known diabetic peripheral neuropathy .  Renal: Patient does not have known baseline CKD. She   is not on an ACEI/ARB at present.     3) Dyslipidemia: Patient is on a statin. LDL above goal.  - If LDL remains above goal, will consider Zetia      Medication Continue atorvastatin 40 mg daily     F/U in 4 months     Signed electronically by: Lyndle Herrlich, MD  [facility] Endocrinology  [facility] [address] [place] [place], [place] [address] Phone: [phone] FAX: [phone]   CC: Deisha Stull, Konrad Dolores, MD [address] [place] [address] Phone: [phone]  Fax: [phone]  Return to Endocrinology clinic as below: Future Appointments  Date Time Provider Department Center  07/05/2022  8:00 AM [facility]  09/07/2022  9:10 AM Aleecia Tapia, Konrad Dolores, MD [facility] None

## 2022-06-27 NOTE — Progress Notes (Deleted)
Name: [name]  Age/ Sex: 47 y.o., female   MRN/ DOB: [id number], 05/26/1976     PCP: Cloyd Stagers, MD   Reason for Endocrinology Evaluation: Type 2 Diabetes Mellitus  Initial Endocrine Consultative Visit: 10/20/2020    PATIENT IDENTIFIER: [name] is a 47 y.o. female with a past medical history of T2DM, Dyslipidemia, Crohn's disease and HTN. The patient has followed with Endocrinology clinic since 10/20/2020 for consultative assistance with management of her diabetes.  DIABETIC HISTORY:  Ms. Lute was diagnosed with DM as gestational diabetes at age 73, by age 3 she was started on Metformin . Insulin was started in her 30's. Her hemoglobin A1c has ranged from 7.9% in 2012, peaking at 12.8% in 2022  ON her initial visit to our clinic  she had an A1c of 12.8 % . She had Metformin, lantus and Humalog on her list but had not taken insulin in 2 months by then. We continued Metformin, started [place] and restarted insulin .  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 She checks her blood sugars occasionally. The patient has not had hypoglycemic episodes since the last clinic visit. She did not bring the meter today   She has Crohn's with chronic diarrhea  She has NOT noted any worsening diarrhea , she gets steroids every few months for    This patient with type 2 diabetes is treated with omnipod (insulin pump). During the visit the pump basal and bolus doses were reviewed including carb/insulin rations and supplemental doses. The clinical list was updated. The glucose meter download was reviewed in detail to  determine if the current pump settings are providing the best glycemic control without excessive hypoglycemia.  Pump and meter download:    Pump   ****  Settings   Insulin type   ***   Basal rate       0000-0200  0.8 u/h    0200-1000  1.5 u/h    1000-0000  0.8 u/h       I:C ratio       0000-0600  1:8    0600-1000  1:5    1000-2000  1:7    2000-0000  1:8       Sensitivity       0000  35       Goal       0000  70-110             Type & Model of Pump: *** Insulin Type: Currently using ***.  There is no height or weight on file to calculate BMI.  PUMP STATISTICS: Average BG: *** +/- *** Average Daily Carbs (g): *** +/- *** Average Total Daily Insulin: *** +/- *** Average Daily Basal: *** (*** %) Average Daily Bolus: *** (*** %) Total Daily Dose in Units / KG: ***     HOME DIABETES REGIMEN:  Metformin 500 mg , 1 tablet Twice daily  Farxiga 10 mg, 1 tablet in the morning  Tresiba 15-18 units BID  Humalog 15 units with each meal  CF : Humalog ( BG- 130/40)  Statin: yes ACE-I/ARB: yes Prior Diabetic Education: yes   METER DOWNLOAD SUMMARY: Did not bring     DIABETIC COMPLICATIONS: Microvascular complications:  neuropathy Denies: CKD, retinopathy Last Eye Exam: Completed 10/2020  Macrovascular complications:   Denies: CAD, CVA, PVD   HISTORY:  Past Medical History:  Past Medical History:  Diagnosis Date   Anxiety    Crohn's disease ([place])    Diabetes mellitus    DKA (diabetic ketoacidosis) ([place])    Hypertension    Insomnia    Neuropathy    Past Surgical History:  Past Surgical History:  Procedure Laterality Date   RECTAL SURGERY     RIGHT/LEFT HEART CATH AND CORONARY ANGIOGRAPHY N/A 04/21/2022   Procedure: RIGHT/LEFT HEART CATH AND CORONARY ANGIOGRAPHY;  Surgeon: Dixie Dials, MD;  Location: [place] [facility];  Service: Cardiovascular;  Laterality: N/A;   Social History:  reports that she has been smoking cigarettes. She has  been smoking an average of .5 packs per day. She has never used smokeless tobacco. She reports that she does not drink alcohol and does not use drugs. Family History:  Family History  Problem Relation Age of Onset   Heart disease Father    Crohn's disease Father    Colon cancer Neg Hx    Rectal cancer Neg Hx    Esophageal cancer Neg Hx      HOME MEDICATIONS: Allergies as of 06/27/2022       Reactions   Ceclor [cefaclor] Hives        Medication List        Accurate as of June 27, 2022  7:32 AM. If you have any questions, ask your nurse or doctor.          Accu-Chek Guide Me w/Device Kit 1 Device by Does not apply route in the morning, at noon, in the evening, and at bedtime.   Accu-Chek Guide test strip Generic drug: glucose blood 1 each by Other route in the morning, at noon, in the evening, and at bedtime. Use as instructed   acetaminophen 325 MG tablet Commonly known as: TYLENOL Take 650 mg by mouth every 6 (six) hours as needed for mild pain or moderate pain.   aspirin EC 81 MG tablet Take 1 tablet (81 mg total) by mouth daily. Swallow whole.   atorvastatin 80 MG tablet Commonly known as: LIPITOR Take 1 tablet (80 mg total) by mouth at bedtime.   dapagliflozin propanediol 10 MG Tabs tablet Commonly known as: Farxiga Take 1 tablet (10 mg total) by mouth daily.   Dexcom G6 Sensor Misc USE 1 device AS DIRECTED   Entresto 24-26 MG Generic drug: sacubitril-valsartan Take 1 tablet by mouth 2 (two) times daily.   escitalopram 20 MG tablet Commonly known as: LEXAPRO Take 20 mg by mouth daily.   furosemide 20 MG tablet Commonly known as: Lasix Take 2 tablets (40 mg total) by mouth as needed.   gabapentin 300 MG capsule Commonly known as: NEURONTIN Take 300 mg by mouth 3 (three) times daily.   insulin lispro 100 UNIT/ML injection Commonly known as: HumaLOG Max daily 60 units per pump   lidocaine 5 % Commonly known as: Lidoderm Place 1 patch  onto the skin daily. Remove & Discard patch within 12 hours or as directed by MD   metFORMIN 500 MG 24 hr tablet Commonly known as: GLUCOPHAGE-XR Take 1 tablet (500 mg total) by mouth 2 (two) times daily with a meal.   metoprolol succinate 25 MG 24  hr tablet Commonly known as: Toprol XL Take 0.5 tablets (12.5 mg total) by mouth daily.   nitroGLYCERIN 0.4 MG SL tablet Commonly known as: NITROSTAT Place 1 tablet (0.4 mg total) under the tongue every 5 (five) minutes x 3 doses as needed for chest pain.   Omnipod 5 G6 Intro (Gen 5) Kit 1 Device by Does not apply route every other day.   Omnipod 5 G6 Pod (Gen 5) Misc Use as directed EVERY OTHER DAY   ondansetron 4 MG tablet Commonly known as: Zofran Take 1 tablet (4 mg total) by mouth every 6 (six) hours as needed for nausea or vomiting.   pantoprazole 40 MG tablet Commonly known as: Protonix Take 1 tablet (40 mg total) by mouth daily.   promethazine 25 MG tablet Commonly known as: PHENERGAN promethazine 25 mg tablet  Take 1 tablet every 4 hours by oral route as needed.   spironolactone 25 MG tablet Commonly known as: ALDACTONE Take 0.5 tablets (12.5 mg total) by mouth daily.   zolpidem 10 MG tablet Commonly known as: AMBIEN Take 10 mg by mouth at bedtime as needed for sleep.         OBJECTIVE:   Vital Signs: LMP 08/03/2019 (Approximate)   Wt Readings from Last 3 Encounters:  06/14/22 203 lb 6.4 oz (92.3 kg)  05/26/22 203 lb 3.2 oz (92.2 kg)  05/02/22 199 lb 6.4 oz (90.4 kg)     Exam: General: Pt appears well and is in NAD  Lungs: Clear with good BS bilat   Heart: RRR  Abdomen: Normoactive bowel sounds, soft, nontender, without masses or organomegaly palpable  Extremities: No pretibial edema.   Neuro: MS is good with appropriate affect, pt is alert and Ox3   DM foot exam:03/04/2022   The skin of the feet is  without sores or ulcerations. The pedal pulses are 2+ on right and 2+ on left. The sensation is  absent  to a screening 5.07, 10 gram monofilament bilaterally        DATA REVIEWED:  Lab Results  Component Value Date   HGBA1C 12.8 (A) 10/20/2020   HGBA1C (H) 08/05/2010    Latest Reference Range & Units 02/22/21 08:27  Sodium 135 - 145 mmol/L 141  Potassium 3.5 - 5.1 mmol/L 3.8  Chloride 98 - 111 mmol/L 103  CO2 22 - 32 mmol/L 25  Glucose 70 - 99 mg/dL 217 (H)  BUN 6 - 20 mg/dL 13  Creatinine 0.44 - 1.00 mg/dL 0.50  Calcium 8.9 - 10.3 mg/dL 9.1  Anion gap 5 - 15  13  Phosphorus 2.5 - 4.6 mg/dL 3.7  Albumin 3.5 - 5.0 g/dL 4.0  GFR, Estimated >60 mL/min >60  (H): Data is abnormally high  ASSESSMENT / PLAN / RECOMMENDATIONS:   1) Type 2 Diabetes Mellitus, Poorly controlled, With neuropathic complications and microabuminuria - Most recent A1c of 10.0 %. Goal A1c < 7.0 %.     - A1c remains above goal despite decreasing from 12.0% to 10.0%  - I did express my concern with being on Ozempic and having Crohn's disease , I have recommended discontinuing this  - Intolerant to higher doses of metformin, will switch to XR - Pt is interested in an insulin pump , Omnipod 5 and dexcom G6 sent to pharmacy, A referral to our CDE for training has been done  - Will change prandial dose of insulin and provide her with a correction scale to be used before each meal  - will  also change tresiba from BID dosing to once daily dosing as below  MEDICATIONS: Continue Metformin 500 mg , 1 tablet Twice daily  Continue  Farxiga 10  mg, 1 tablet in the morning  Change Tresiba 32 units once daily  Increase Humalog 18 units with each meal  Start Correction scale : HUmalog( BG-130/20)   EDUCATION / INSTRUCTIONS: BG monitoring instructions: Patient is instructed to check her blood sugars 3 times a day, before each meal . Call [place] [facility] if: BG persistently < 70  I reviewed the Rule of 15 for the treatment of hypoglycemia in detail with the patient. Literature supplied.   2)  Diabetic complications:  Eye: Does not have known diabetic retinopathy.  Neuro/ Feet: Does have known diabetic peripheral neuropathy .  Renal: Patient does not have known baseline CKD. She   is not on an ACEI/ARB at present.     3) Dyslipidemia: Patient is on a statin. LDL above goal. Discussed cardiolvascular benefits and encouraged compliance. If LDL remains above goal, will consider Zetia      Medication Continue atorvastatin 40 mg daily     F/U in 4 months     Signed electronically by: Mack Guise, MD  [facility] Endocrinology  [facility] [address] [place], [place] [address] Phone: [phone] FAX: [phone]   CC: [place], Melanie Crazier, [place] [address] Phone: [phone]  Fax: [phone]  Return to Endocrinology clinic as below: Future Appointments  Date Time Provider [place]  06/27/2022 11:30 AM Letitia Sabala, Melanie Crazier, MD [facility] None  07/05/2022  8:00 AM [facility] OP 1 [facility]  09/07/2022  9:10 AM Anaissa Macfadden, Melanie Crazier, MD [facility] None

## 2022-06-28 ENCOUNTER — Encounter: Payer: Self-pay | Admitting: Internal Medicine

## 2022-06-28 ENCOUNTER — Ambulatory Visit (INDEPENDENT_AMBULATORY_CARE_PROVIDER_SITE_OTHER): Payer: Medicaid Other | Admitting: Internal Medicine

## 2022-06-28 VITALS — BP 102/70 | HR 80 | Ht 64.0 in | Wt 206.0 lb

## 2022-06-28 DIAGNOSIS — Z794 Long term (current) use of insulin: Secondary | ICD-10-CM

## 2022-06-28 DIAGNOSIS — E1165 Type 2 diabetes mellitus with hyperglycemia: Secondary | ICD-10-CM

## 2022-06-28 DIAGNOSIS — E1142 Type 2 diabetes mellitus with diabetic polyneuropathy: Secondary | ICD-10-CM | POA: Diagnosis not present

## 2022-06-28 NOTE — Patient Instructions (Signed)

## 2022-06-29 ENCOUNTER — Other Ambulatory Visit (HOSPITAL_COMMUNITY): Payer: Self-pay | Admitting: Cardiology

## 2022-06-29 ENCOUNTER — Encounter: Payer: Self-pay | Admitting: Internal Medicine

## 2022-07-04 ENCOUNTER — Telehealth: Payer: Self-pay | Admitting: Registered"

## 2022-07-04 ENCOUNTER — Telehealth: Payer: Self-pay | Admitting: *Deleted

## 2022-07-04 NOTE — Telephone Encounter (Signed)
Pharmacy is calling for clarification on the lidocaine prescription sent  in on 12/11, explained that the prescription sent on 12/11 per epic notes instructions say lidocaine 5 % patch,30 patches, one patch transdermal , every 24 hours, verbalized understanding.

## 2022-07-04 NOTE — Telephone Encounter (Signed)
Patient has been advised and sliding scale has been upload to [employer].

## 2022-07-04 NOTE — Telephone Encounter (Signed)
Pt has not had insulin delivery since 07/03/22 around 11 am due to pump controller malfunction.  Pt states she called Tech support yesterday and they were not able to fix controller issue and they told her she could take pump off because she wasn't getting any insulin from it without the controller. Pt states she was having a hard time understanding tech support person with heavy accent, but feels she was following his instructions.  Pt is expecting a replacement controller tomorrow via [employer]. Pt states she thinks she has more long acting insulin pens. Pt states she has syringes and could use the fast acting insulin, but doesn't know how much she should use. Pt states her current glucose reading is 336 mg/dL. Pt reports she is really thirsty but cannot drink much water because of other health concerns. Pt states is has not eaten and not feeling well.   Pt was advised to contact Dr. Quin Hoop office today to get instructions on switching back to daily injections until she can get the pump started again.

## 2022-07-05 ENCOUNTER — Ambulatory Visit (HOSPITAL_COMMUNITY)
Admission: RE | Admit: 2022-07-05 | Discharge: 2022-07-05 | Disposition: A | Discharge status: Home / Self Care | Payer: Medicaid Other | Source: Ambulatory Visit | Attending: Cardiology | Admitting: Cardiology

## 2022-07-05 DIAGNOSIS — I110 Hypertensive heart disease with heart failure: Secondary | ICD-10-CM | POA: Diagnosis not present

## 2022-07-05 DIAGNOSIS — E119 Type 2 diabetes mellitus without complications: Secondary | ICD-10-CM | POA: Diagnosis not present

## 2022-07-05 DIAGNOSIS — I2510 Atherosclerotic heart disease of native coronary artery without angina pectoris: Secondary | ICD-10-CM | POA: Diagnosis not present

## 2022-07-05 DIAGNOSIS — E785 Hyperlipidemia, unspecified: Secondary | ICD-10-CM | POA: Diagnosis not present

## 2022-07-05 DIAGNOSIS — I5022 Chronic systolic (congestive) heart failure: Secondary | ICD-10-CM | POA: Insufficient documentation

## 2022-07-05 LAB — ECHOCARDIOGRAM COMPLETE
Area-P 1/2: 3.31 cm2
Calc EF: 58.9 %
S' Lateral: 4.35 cm
Single Plane A2C EF: 56.3 %
Single Plane A4C EF: 65.8 %

## 2022-07-06 ENCOUNTER — Encounter: Payer: Self-pay | Admitting: Registered"

## 2022-07-06 ENCOUNTER — Encounter: Payer: Medicaid Other | Admitting: Registered"

## 2022-07-06 DIAGNOSIS — E1169 Type 2 diabetes mellitus with other specified complication: Secondary | ICD-10-CM

## 2022-07-06 NOTE — Progress Notes (Signed)
Patient here to have settings re-entered in replacement Omnipod controller Start [phone]    End [phone]  New device needed to update prior to settings. Pt states last insulin injected was long acting last night. Did not start new pod in temp basal because blood sugar over 200 and automated mode should prevent hypoglycemia. Pt state she is confident it will not go too low and has knowledge of how to treat lows.  Per [employer] device settings 07/02/2022 Basal 24 hr 1.55 u/hr = 37.2 u I:C = 1:1 Sensitivity: 20 mg/dL Target 120 mg/dL  Correct above 140 mg/dL  Settings re-entered from initial pump start 06/13/22 Max basal 2 u/hr Bolus 25 u Duration of action 4 hrs  Pt successfully started new pod in automated mode, states no questions.

## 2022-07-07 ENCOUNTER — Telehealth (HOSPITAL_COMMUNITY): Payer: Self-pay

## 2022-07-07 NOTE — Telephone Encounter (Signed)
Patient aware of results.

## 2022-07-28 ENCOUNTER — Other Ambulatory Visit (HOSPITAL_COMMUNITY): Payer: Self-pay | Admitting: Cardiology

## 2022-08-15 ENCOUNTER — Telehealth: Payer: Self-pay

## 2022-08-15 ENCOUNTER — Encounter: Payer: Self-pay | Admitting: Internal Medicine

## 2022-08-15 MED ORDER — LANTUS SOLOSTAR 100 UNIT/ML ~~LOC~~ SOPN: 50.0000 [IU] | PEN_INJECTOR | Freq: Every day | SUBCUTANEOUS | 1 refills | Status: DC

## 2022-08-15 MED ORDER — INSULIN LISPRO (1 UNIT DIAL) 100 UNIT/ML (KWIKPEN): PEN_INJECTOR | SUBCUTANEOUS | 1 refills | Status: DC

## 2022-08-15 MED ORDER — INSULIN LISPRO 100 UNIT/ML IJ SOLN: INTRAMUSCULAR | 3 refills | Status: DC

## 2022-08-15 MED ORDER — INSULIN PEN NEEDLE 32G X 4 MM MISC: 1.0000 | Freq: Four times a day (QID) | 1 refills | Status: DC

## 2022-08-15 NOTE — Telephone Encounter (Signed)
Pa needed for Dexcom sensors and transmitter . PA will run out tonight and needs this expedited.  Provider line for [facility] is [phone]. Patient advise that she can stop by office to pick up sample.

## 2022-08-16 NOTE — Telephone Encounter (Signed)
Patient came in to office today and picked up sample of Dexcom G6 Sensor.

## 2022-08-19 ENCOUNTER — Other Ambulatory Visit (HOSPITAL_COMMUNITY): Payer: Self-pay

## 2022-08-19 ENCOUNTER — Telehealth: Payer: Self-pay

## 2022-08-19 NOTE — Telephone Encounter (Signed)
Sample of Dexcom placed up front for patient.

## 2022-08-22 ENCOUNTER — Telehealth: Payer: Self-pay

## 2022-08-22 NOTE — Telephone Encounter (Signed)
Please see separate encounter.

## 2022-08-22 NOTE — Telephone Encounter (Signed)
Pharmacy Patient Advocate Encounter  Prior Authorization for [facility] 6 SENSORS/TRANSMITTER has been approved.    Effective dates: 08/22/22 through 08/22/22.  Karie Soda, CPhT Pharmacy Patient Advocate Specialist Direct Number: [phone] Fax: [phone]

## 2022-08-22 NOTE — Telephone Encounter (Signed)
Pharmacy Patient Advocate Encounter   Received notification from [place] that prior authorization for [facility] 6 SENSORS/TRANSMITTER is needed.    EXPEDITED PA submitted on 08/22/22 [employer] [id number]          [id number] Status is pending  Karie Soda, [place] Patient Advocate Specialist Direct Number: [phone] Fax: [phone]

## 2022-08-29 ENCOUNTER — Other Ambulatory Visit (HOSPITAL_COMMUNITY): Payer: Self-pay | Admitting: Cardiology

## 2022-09-07 ENCOUNTER — Ambulatory Visit (INDEPENDENT_AMBULATORY_CARE_PROVIDER_SITE_OTHER): Payer: Medicaid Other | Admitting: Internal Medicine

## 2022-09-07 ENCOUNTER — Encounter: Payer: Self-pay | Admitting: Internal Medicine

## 2022-09-07 VITALS — BP 122/86 | HR 94 | Ht 64.0 in | Wt 222.0 lb

## 2022-09-07 DIAGNOSIS — E1165 Type 2 diabetes mellitus with hyperglycemia: Secondary | ICD-10-CM

## 2022-09-07 DIAGNOSIS — Z794 Long term (current) use of insulin: Secondary | ICD-10-CM

## 2022-09-07 DIAGNOSIS — E1142 Type 2 diabetes mellitus with diabetic polyneuropathy: Secondary | ICD-10-CM | POA: Diagnosis not present

## 2022-09-07 DIAGNOSIS — E785 Hyperlipidemia, unspecified: Secondary | ICD-10-CM

## 2022-09-07 LAB — MICROALBUMIN / CREATININE URINE RATIO
Creatinine,U: 55.2 mg/dL
Microalb Creat Ratio: 6.8 mg/g (ref 0.0–30.0)
Microalb, Ur: 3.8 mg/dL — ABNORMAL HIGH (ref 0.0–1.9)

## 2022-09-07 LAB — LIPID PANEL
Cholesterol: 157 mg/dL (ref 0–200)
HDL: 35.4 mg/dL — ABNORMAL LOW (ref >39.00)
NonHDL: 121.83
Total CHOL/HDL Ratio: 4
Triglycerides: 358 mg/dL — ABNORMAL HIGH (ref 0.0–149.0)
VLDL: 71.6 mg/dL — ABNORMAL HIGH (ref 0.0–40.0)

## 2022-09-07 LAB — BASIC METABOLIC PANEL
BUN: 14 mg/dL (ref 6–23)
CO2: 24 mEq/L (ref 19–32)
Calcium: 9 mg/dL (ref 8.4–10.5)
Chloride: 105 mEq/L (ref 96–112)
Creatinine, Ser: 0.54 mg/dL (ref 0.40–1.20)
GFR: 110.52 mL/min (ref >60.00)
Glucose, Bld: 169 mg/dL — ABNORMAL HIGH (ref 70–99)
Potassium: 4 mEq/L (ref 3.5–5.1)
Sodium: 137 mEq/L (ref 135–145)

## 2022-09-07 LAB — POCT GLYCOSYLATED HEMOGLOBIN (HGB A1C): Hemoglobin A1C: 8.2 % — AB (ref 4.0–5.6)

## 2022-09-07 LAB — LDL CHOLESTEROL, DIRECT: Direct LDL: 73 mg/dL

## 2022-09-07 MED ORDER — METFORMIN HCL ER 500 MG PO TB24: 500.0000 mg | ORAL_TABLET | Freq: Two times a day (BID) | ORAL | 3 refills | Status: DC

## 2022-09-07 MED ORDER — DAPAGLIFLOZIN PROPANEDIOL 10 MG PO TABS: 10.0000 mg | ORAL_TABLET | Freq: Every day | ORAL | 3 refills | Status: DC

## 2022-09-07 NOTE — Progress Notes (Unsigned)
Name: [name]  Age/ Sex: 47 y.o., female   MRN/ DOB: [id number], 1976-02-11     PCP: Cloyd Stagers, MD   Reason for Endocrinology Evaluation: Type 2 Diabetes Mellitus  Initial Endocrine Consultative Visit: 10/20/2020    PATIENT IDENTIFIER: [name] is a 47 y.o. female with a past medical history of T2DM, Dyslipidemia, Crohn's disease, CAD, CHF  and HTN. The patient has followed with Endocrinology clinic since 10/20/2020 for consultative assistance with management of her diabetes.  DIABETIC HISTORY:  Ms. Arai was diagnosed with DM as gestational diabetes at age 72, by age 64 she was started on Metformin . Insulin was started in her 30's. Her hemoglobin A1c has ranged from 7.9% in 2012, peaking at 12.8% in 2022  ON her initial visit to our clinic  she had an A1c of 12.8 % . She had Metformin, lantus and Humalog on her list but had not taken insulin in 2 months by then. We continued Metformin, started [place] and restarted insulin .  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 She checks her blood sugars multiple times daily through CGM.Marland Kitchen The patient has had hypoglycemic episodes since the last clinic visit. She is symptomatic with these symptoms     She has Crohn's with chronic diarrhea -is [facility] She has chronic  nausea with gastroparesis , has chronic constipation  She had an NESTMI and CHF 04/2022, had recent echo  She was seen by cardiology 05/19/2022     This patient with type 2 diabetes is treated with omnipod (insulin pump). During the visit  the pump basal and bolus doses were reviewed including carb/insulin rations and supplemental doses. The clinical list was updated. The glucose meter download was reviewed in detail to determine if the current pump settings are providing the best glycemic control without excessive hypoglycemia.  Pump and meter download:    Pump   OmniPod Settings   Insulin type   Humalog   Basal rate       0000 1.55u/h               I:C ratio       0000 1:1 #12 with a meal, #6 with snack                  Sensitivity       0000  20      Goal       0000  120             Type & Model of Pump: OmniPod Insulin Type: Currently using Humalog.  There is no height or weight on file to calculate BMI.  PUMP STATISTICS: Average BG: 156 Average Daily Carbs (g): 14.9 Average Total Daily Insulin: 41.9 Average Daily Basal: 31.6 (75%) Average Daily Bolus: 10.3 (25%)   CONTINUOUS GLUCOSE MONITORING RECORD INTERPRETATION    Dates of Recording: 3/21- 09/07/2022  Sensor description: Dexcom  Results statistics:   CGM use % of time 81.9%  Average and SD 156/37  Time in range  78  %  % Time Above 180 20  % Time above 250 2  % Time Below target 0   Glycemic patterns summary: BG's optimal over night and day   Hyperglycemic episodes postprandial  Hypoglycemic episodes occurred late evening   Overnight periods: optimal    HOME DIABETES REGIMEN:  Metformin 500 mg , 1 tablet Twice daily  Farxiga 10 mg, 1 tablet in the morning  Humalog      Statin: yes ACE-I/ARB: yes Prior Diabetic Education: yes     DIABETIC COMPLICATIONS: Microvascular complications:  neuropathy Denies: CKD, retinopathy Last Eye Exam: Completed 10/2020  Macrovascular complications:   Denies: CAD, CVA, PVD   HISTORY:  Past Medical History:  Past Medical History:  Diagnosis Date   Anxiety    Crohn's disease ([place])    Diabetes mellitus    DKA (diabetic ketoacidosis) ([place])    Hypertension    Insomnia     Neuropathy    Past Surgical History:  Past Surgical History:  Procedure Laterality Date   RECTAL SURGERY     RIGHT/LEFT HEART CATH AND CORONARY ANGIOGRAPHY N/A 04/21/2022   Procedure: RIGHT/LEFT HEART CATH AND CORONARY ANGIOGRAPHY;  Surgeon: Dixie Dials, MD;  Location: [facility];  Service: Cardiovascular;  Laterality: N/A;   Social History:  reports that she has been smoking cigarettes. She has been smoking an average of .5 packs per day. She has never used smokeless tobacco. She reports that she does not drink alcohol and does not use drugs. Family History:  Family History  Problem Relation Age of Onset   Heart disease Father    Crohn's disease Father    Colon cancer Neg Hx    Rectal cancer Neg Hx    Esophageal cancer Neg Hx      HOME MEDICATIONS: Allergies as of 09/07/2022       Reactions   Ceclor [cefaclor] Hives        Medication List        Accurate as of September 07, 2022  7:06 AM. If you have any questions, ask your nurse or doctor.          Accu-Chek Guide Me w/Device Kit 1 Device by Does not apply route in the morning, at noon, in the evening, and at bedtime.   Accu-Chek Guide test strip Generic drug: glucose blood 1 each by Other route in the morning, at noon, in the evening, and at bedtime. Use as instructed   acetaminophen 325 MG tablet Commonly known as: TYLENOL Take 650 mg by mouth every 6 (six) hours as needed for mild pain or moderate pain.   aspirin EC 81 MG tablet Take 1 tablet (81 mg total) by mouth daily. Swallow whole.   atorvastatin 80 MG tablet Commonly known as: LIPITOR Take 1 tablet (80 mg total) by mouth at bedtime.   dapagliflozin propanediol 10 MG Tabs tablet Commonly known as: Farxiga Take 1 tablet (10 mg total) by mouth daily.   Dexcom G6 Sensor Misc USE 1 device AS DIRECTED   Entresto 24-26 MG Generic drug: sacubitril-valsartan Take 1 tablet by mouth 2 (two) times daily.   escitalopram 20 MG tablet Commonly  known as: LEXAPRO Take 20 mg by mouth daily.   furosemide 20 MG tablet Commonly known as: LASIX TAKE TWO TABLETS BY MOUTH DAILY AS NEEDED (BOTTLE)   gabapentin 300 MG capsule Commonly known as: NEURONTIN Take 300 mg by mouth 3 (three) times daily.   insulin lispro 100 UNIT/ML KwikPen Commonly known  as: HumaLOG KwikPen Keep on hold if the pump fails   insulin lispro 100 UNIT/ML injection Commonly known as: HumaLOG Max daily 80 units per pump   Insulin Pen Needle 32G X 4 MM Misc 1 Device by Does not apply route in the morning, at noon, in the evening, and at bedtime.   Lantus SoloStar 100 UNIT/ML Solostar Pen Generic drug: insulin glargine Inject 50 Units into the skin daily.   lidocaine 5 % Commonly known as: Lidoderm Place 1 patch onto the skin daily. Remove & Discard patch within 12 hours or as directed by MD   metFORMIN 500 MG 24 hr tablet Commonly known as: GLUCOPHAGE-XR Take 1 tablet (500 mg total) by mouth 2 (two) times daily with a meal.   metoprolol succinate 25 MG 24 hr tablet Commonly known as: TOPROL-XL take 1/2 TABLET BY MOUTH DAILY (morning)   nitroGLYCERIN 0.4 MG SL tablet Commonly known as: NITROSTAT Place 1 tablet (0.4 mg total) under the tongue every 5 (five) minutes x 3 doses as needed for chest pain.   Omnipod 5 G6 Intro (Gen 5) Kit 1 Device by Does not apply route every other day.   Omnipod 5 G6 Pods (Gen 5) Misc Use as directed EVERY OTHER DAY   ondansetron 4 MG tablet Commonly known as: Zofran Take 1 tablet (4 mg total) by mouth every 6 (six) hours as needed for nausea or vomiting.   pantoprazole 40 MG tablet Commonly known as: Protonix Take 1 tablet (40 mg total) by mouth daily.   promethazine 25 MG tablet Commonly known as: PHENERGAN promethazine 25 mg tablet  Take 1 tablet every 4 hours by oral route as needed.   spironolactone 25 MG tablet Commonly known as: ALDACTONE Take 0.5 tablets (12.5 mg total) by mouth daily.   zolpidem  10 MG tablet Commonly known as: AMBIEN Take 10 mg by mouth at bedtime as needed for sleep.         OBJECTIVE:   Vital Signs: LMP 08/03/2019 (Approximate)   Wt Readings from Last 3 Encounters:  06/28/22 206 lb (93.4 kg)  06/14/22 203 lb 6.4 oz (92.3 kg)  05/26/22 203 lb 3.2 oz (92.2 kg)     Exam: General: Pt appears well and is in NAD  Lungs: Clear with good BS bilat   Heart: RRR  Extremities: No pretibial edema.   Neuro: MS is good with appropriate affect, pt is alert and Ox3   DM foot exam:09/07/2022   The skin of the feet is  without sores or ulcerations. The pedal pulses are 2+ on right and 2+ on left. The sensation is absent  to a screening 5.07, 10 gram monofilament bilaterally        DATA REVIEWED:  Lab Results  Component Value Date   HGBA1C 12.8 (A) 10/20/2020   HGBA1C (H) 08/05/2010    Latest Reference Range & Units 02/22/21 08:27  Sodium 135 - 145 mmol/L 141  Potassium 3.5 - 5.1 mmol/L 3.8  Chloride 98 - 111 mmol/L 103  CO2 22 - 32 mmol/L 25  Glucose 70 - 99 mg/dL 217 (H)  BUN 6 - 20 mg/dL 13  Creatinine 0.44 - 1.00 mg/dL 0.50  Calcium 8.9 - 10.3 mg/dL 9.1  Anion gap 5 - 15  13  Phosphorus 2.5 - 4.6 mg/dL 3.7  Albumin 3.5 - 5.0 g/dL 4.0  GFR, Estimated >60 mL/min >60  (H): Data is abnormally high  ASSESSMENT / PLAN / RECOMMENDATIONS:   1) Type 2 Diabetes  Mellitus, Poorly controlled, With neuropathic complications and microabuminuria - Most recent A1c of 8.2 %. Goal A1c < 7.0 %.    - A1c trended down from 9.5% to 8.2%  - Intolerant to higher doses of metformin - I will adjust her sensitivity factor as she has been noted with hypoglycemia post correction   MEDICATIONS: Continue Metformin 500 mg , 1 tablet Twice daily  Continue  Farxiga 10  mg, 1 tablet in the morning      Pump   OmniPod Settings   Insulin type   Humalog   Basal rate       0000 1.55u/h               I:C ratio       0000 1:1 #12 with a meal, #6 with snack                   Sensitivity       0000  25      Goal       0000  120          EDUCATION / INSTRUCTIONS: BG monitoring instructions: Patient is instructed to check her blood sugars 3 times a day, before each meal . Call [facility] if: BG persistently < 70  I reviewed the Rule of 15 for the treatment of hypoglycemia in detail with the patient. Literature supplied.   2) Diabetic complications:  Eye: Does not have known diabetic retinopathy.  Neuro/ Feet: Does have known diabetic peripheral neuropathy .  Renal: Patient does not have known baseline CKD. She   is not on an ACEI/ARB at present.     3) Dyslipidemia: Patient is on a statin. LDL above goal.  - If LDL remains above goal, will consider Zetia      Medication Continue atorvastatin 40 mg daily     F/U in 6 months     Signed electronically by: Mack Guise, MD  [facility]  [facility] [address] [place], [place] [address] Phone: [phone] FAX: [phone]   CC: [place], Melanie Crazier, [place] [address] Phone: [phone]  Fax: [phone]  Return to Endocrinology clinic as below: Future Appointments  Date Time Provider [place]  09/07/2022  9:10 AM Garrett Mitchum, Melanie Crazier, MD [facility] None  12/02/2022 10:30 AM Tadd Holtmeyer, Melanie Crazier, MD [facility] None

## 2022-09-07 NOTE — Patient Instructions (Signed)
-   A1c 8.2%, keep up the Good Work !    HOW TO TREAT LOW BLOOD SUGARS (Blood sugar LESS THAN 70 MG/DL) Please follow the RULE OF 15 for the treatment of hypoglycemia treatment (when your (blood sugars are less than 70 mg/dL)   STEP 1: Take 15 grams of carbohydrates when your blood sugar is low, which includes:  3-4 GLUCOSE TABS  OR 3-4 OZ OF JUICE OR REGULAR SODA OR ONE TUBE OF GLUCOSE GEL    STEP 2: RECHECK blood sugar in 15 MINUTES STEP 3: If your blood sugar is still low at the 15 minute recheck --> then, go back to STEP 1 and treat AGAIN with another 15 grams of carbohydrates.

## 2022-09-08 MED ORDER — FENOFIBRATE 160 MG PO TABS: 160.0000 mg | ORAL_TABLET | Freq: Every evening | ORAL | 3 refills | Status: DC

## 2022-09-12 ENCOUNTER — Encounter: Payer: Self-pay | Admitting: Internal Medicine

## 2022-09-23 ENCOUNTER — Other Ambulatory Visit: Payer: Self-pay | Admitting: Internal Medicine

## 2022-10-24 ENCOUNTER — Other Ambulatory Visit (HOSPITAL_COMMUNITY): Payer: Self-pay | Admitting: Cardiology

## 2022-11-18 ENCOUNTER — Other Ambulatory Visit: Payer: Self-pay

## 2022-11-18 MED ORDER — DEXCOM G6 SENSOR MISC: 1 refills | Status: DC

## 2022-11-28 ENCOUNTER — Other Ambulatory Visit (HOSPITAL_COMMUNITY): Payer: Self-pay | Admitting: Cardiology

## 2022-11-29 ENCOUNTER — Encounter: Payer: Self-pay | Admitting: Podiatry

## 2022-11-29 NOTE — Progress Notes (Signed)
Completed prior authorization that I just received.

## 2022-12-02 ENCOUNTER — Ambulatory Visit: Payer: Medicaid Other | Admitting: Internal Medicine

## 2022-12-23 ENCOUNTER — Other Ambulatory Visit (HOSPITAL_COMMUNITY): Payer: Self-pay | Admitting: Cardiology

## 2022-12-26 ENCOUNTER — Other Ambulatory Visit: Payer: Self-pay

## 2022-12-26 DIAGNOSIS — Z794 Long term (current) use of insulin: Secondary | ICD-10-CM

## 2022-12-26 MED ORDER — DEXCOM G6 SENSOR MISC
1 refills | Status: DC
Start: 2022-12-26 — End: 2023-05-08

## 2023-01-16 ENCOUNTER — Other Ambulatory Visit: Payer: Self-pay | Admitting: Internal Medicine

## 2023-02-17 ENCOUNTER — Encounter (HOSPITAL_COMMUNITY): Payer: Self-pay

## 2023-02-17 ENCOUNTER — Emergency Department (HOSPITAL_COMMUNITY): Payer: Medicaid Other

## 2023-02-17 ENCOUNTER — Other Ambulatory Visit: Payer: Self-pay

## 2023-02-17 ENCOUNTER — Emergency Department (HOSPITAL_COMMUNITY)
Admission: EM | Admit: 2023-02-17 | Discharge: 2023-02-17 | Disposition: A | Discharge status: Home / Self Care | Payer: Medicaid Other | Attending: Emergency Medicine | Admitting: Emergency Medicine

## 2023-02-17 ENCOUNTER — Encounter: Payer: Self-pay | Admitting: Internal Medicine

## 2023-02-17 ENCOUNTER — Encounter (HOSPITAL_COMMUNITY): Payer: Self-pay | Admitting: Cardiology

## 2023-02-17 DIAGNOSIS — Z79899 Other long term (current) drug therapy: Secondary | ICD-10-CM | POA: Insufficient documentation

## 2023-02-17 DIAGNOSIS — R0602 Shortness of breath: Secondary | ICD-10-CM | POA: Diagnosis not present

## 2023-02-17 DIAGNOSIS — R5383 Other fatigue: Secondary | ICD-10-CM | POA: Insufficient documentation

## 2023-02-17 DIAGNOSIS — Z7984 Long term (current) use of oral hypoglycemic drugs: Secondary | ICD-10-CM | POA: Insufficient documentation

## 2023-02-17 DIAGNOSIS — Z8616 Personal history of COVID-19: Secondary | ICD-10-CM | POA: Insufficient documentation

## 2023-02-17 DIAGNOSIS — I509 Heart failure, unspecified: Secondary | ICD-10-CM | POA: Diagnosis not present

## 2023-02-17 DIAGNOSIS — I110 Hypertensive heart disease with heart failure: Secondary | ICD-10-CM | POA: Insufficient documentation

## 2023-02-17 DIAGNOSIS — E119 Type 2 diabetes mellitus without complications: Secondary | ICD-10-CM | POA: Insufficient documentation

## 2023-02-17 DIAGNOSIS — R059 Cough, unspecified: Secondary | ICD-10-CM | POA: Diagnosis not present

## 2023-02-17 DIAGNOSIS — R519 Headache, unspecified: Secondary | ICD-10-CM | POA: Diagnosis not present

## 2023-02-17 DIAGNOSIS — M7989 Other specified soft tissue disorders: Secondary | ICD-10-CM | POA: Diagnosis not present

## 2023-02-17 DIAGNOSIS — R0789 Other chest pain: Secondary | ICD-10-CM | POA: Diagnosis present

## 2023-02-17 DIAGNOSIS — R600 Localized edema: Secondary | ICD-10-CM | POA: Insufficient documentation

## 2023-02-17 DIAGNOSIS — Z794 Long term (current) use of insulin: Secondary | ICD-10-CM | POA: Insufficient documentation

## 2023-02-17 DIAGNOSIS — Z7982 Long term (current) use of aspirin: Secondary | ICD-10-CM | POA: Insufficient documentation

## 2023-02-17 LAB — BASIC METABOLIC PANEL
Anion gap: 16 — ABNORMAL HIGH (ref 5–15)
BUN: 17 mg/dL (ref 6–20)
CO2: 22 mmol/L (ref 22–32)
Calcium: 9.6 mg/dL (ref 8.9–10.3)
Chloride: 100 mmol/L (ref 98–111)
Creatinine, Ser: 0.77 mg/dL (ref 0.44–1.00)
GFR, Estimated: >60 mL/min (ref >60)
Glucose, Bld: 189 mg/dL — ABNORMAL HIGH (ref 70–99)
Potassium: 4.1 mmol/L (ref 3.5–5.1)
Sodium: 138 mmol/L (ref 135–145)

## 2023-02-17 LAB — BRAIN NATRIURETIC PEPTIDE: B Natriuretic Peptide: 8.1 pg/mL (ref 0.0–100.0)

## 2023-02-17 LAB — CBC
HCT: 45.5 % (ref 36.0–46.0)
Hemoglobin: 15.1 g/dL — ABNORMAL HIGH (ref 12.0–15.0)
MCH: 28.9 pg (ref 26.0–34.0)
MCHC: 33.2 g/dL (ref 30.0–36.0)
MCV: 87.2 fL (ref 80.0–100.0)
Platelets: 288 10*3/uL (ref 150–400)
RBC: 5.22 MIL/uL — ABNORMAL HIGH (ref 3.87–5.11)
RDW: 13.5 % (ref 11.5–15.5)
WBC: 11 10*3/uL — ABNORMAL HIGH (ref 4.0–10.5)
nRBC: 0 % (ref 0.0–0.2)

## 2023-02-17 LAB — TROPONIN I (HIGH SENSITIVITY): Troponin I (High Sensitivity): 3 ng/L (ref <18)

## 2023-02-17 MED ORDER — INSULIN LISPRO (1 UNIT DIAL) 100 UNIT/ML (KWIKPEN): PEN_INJECTOR | SUBCUTANEOUS | 2 refills | Status: DC

## 2023-02-17 NOTE — ED Triage Notes (Signed)
Pt was sent by PCP for abnormal EKG. Pt c/o chest tightness across entire chest, posterior neck pain, HA, fatigue, int SOB in morning upon wakingx2wks

## 2023-02-17 NOTE — Telephone Encounter (Signed)
I reviewed her history. She did not have any major blockages on her prior heart catheterization and recent echo showed recovery in heart function. Her troponin ( a blood test) today is normal suggesting she is not having a heart attack. Her ECG looks similar to prior ECGs. No concerning findings. Some of her symptoms may be d/t recovering from COVID infection

## 2023-02-17 NOTE — ED Provider Notes (Signed)
[place] EMERGENCY DEPARTMENT AT [facility] Provider Note   CSN: [id number] Arrival date & time: 02/17/23  1102     History {Add pertinent medical, surgical, social history, OB history to HPI:1} Chief Complaint  Patient presents with   Abnormal ECG    [name] is a 47 y.o. female.  47 year old female with a history of HFpEF, Crohn's disease, diabetes, hypertension, and anxiety who presents to the emergency department with chest discomfort.  Reports that over the past 2 weeks she has had left-sided chest discomfort. Achy pain. Not pleuritic. Says that she also has had some shortness of breath but just in the morning.  Intermittent cough that is not productive.  Also has some headache and fatigue.  Reports that she is getting over COVID that was diagnosed 2 weeks ago.  Went to her primary doctor office because of bilateral lower extremity swelling that she has been taking Lasix for the past 3 days. No hx of DVT/PE, cancer, MI, recent surgery. No recent travel.        Home Medications Prior to Admission medications   Medication Sig Start Date End Date Taking? Authorizing Provider  ACCU-CHEK GUIDE test strip 1 each by Other route in the morning, at noon, in the evening, and at bedtime. Use as instructed 01/26/21   Shamleffer, Konrad Dolores, MD  acetaminophen (TYLENOL) 325 MG tablet Take 650 mg by mouth every 6 (six) hours as needed for mild pain or moderate pain.    [provider]  aspirin EC 81 MG tablet Take 1 tablet (81 mg total) by mouth daily. Swallow whole. 05/02/22   Sabharwal, Aditya, DO  atorvastatin (LIPITOR) 80 MG tablet TAKE 1 Tablet BY MOUTH ONCE DAILY at bedtime (bedtime) 12/26/22   Sabharwal, Aditya, DO  Blood Glucose Monitoring Suppl (ACCU-CHEK GUIDE ME) w/Device KIT 1 Device by Does not apply route in the morning, at noon, in the evening, and at bedtime. 01/26/21   Shamleffer, Konrad Dolores, MD  Continuous Glucose Sensor (DEXCOM G6 SENSOR)  MISC USE 1 device AS DIRECTED 12/26/22   Shamleffer, Konrad Dolores, MD  dapagliflozin propanediol (FARXIGA) 10 MG TABS tablet Take 1 tablet (10 mg total) by mouth daily. 09/07/22   Shamleffer, Konrad Dolores, MD  escitalopram (LEXAPRO) 20 MG tablet Take 20 mg by mouth daily. 09/11/20   [provider]  fenofibrate 160 MG tablet Take 1 tablet (160 mg total) by mouth every evening. 09/08/22   Shamleffer, Konrad Dolores, MD  furosemide (LASIX) 20 MG tablet TAKE TWO TABLETS BY MOUTH DAILY AS NEEDED (BOTTLE) 12/26/22   Sabharwal, Aditya, DO  gabapentin (NEURONTIN) 300 MG capsule Take 300 mg by mouth 3 (three) times daily.    [provider]  Insulin Disposable Pump (OMNIPOD 5 G6 INTRO, GEN 5,) KIT 1 Device by Does not apply route every other day. 03/04/22   Shamleffer, Konrad Dolores, MD  Insulin Disposable Pump (OMNIPOD 5 G6 PODS, GEN 5,) MISC Use as directed EVERY OTHER DAY 01/16/23   Shamleffer, Konrad Dolores, MD  insulin glargine (LANTUS SOLOSTAR) 100 UNIT/ML Solostar Pen Inject 50 Units into the skin daily. 08/15/22   Shamleffer, Konrad Dolores, MD  insulin lispro (HUMALOG KWIKPEN) 100 UNIT/ML KwikPen Keep on hold if the pump fails 08/15/22   Shamleffer, Konrad Dolores, MD  insulin lispro (HUMALOG) 100 UNIT/ML injection Max daily 80 units per pump 08/15/22   Shamleffer, Konrad Dolores, MD  Insulin Pen Needle 32G X 4 MM MISC 1 Device by Does not apply route  in the morning, at noon, in the evening, and at bedtime. 08/15/22   Shamleffer, Konrad Dolores, MD  lidocaine (LIDODERM) 5 % Place 1 patch onto the skin daily. Remove & Discard patch within 12 hours or as directed by MD 05/16/22   Vivi Barrack, DPM  metFORMIN (GLUCOPHAGE-XR) 500 MG 24 hr tablet Take 1 tablet (500 mg total) by mouth 2 (two) times daily with a meal. 09/07/22   Shamleffer, Konrad Dolores, MD  metoprolol succinate (TOPROL-XL) 25 MG 24 hr tablet take 1/2 TABLET BY MOUTH DAILY (morning) 11/28/22   Sabharwal, Aditya, DO   nitroGLYCERIN (NITROSTAT) 0.4 MG SL tablet Place 1 tablet (0.4 mg total) under the tongue every 5 (five) minutes x 3 doses as needed for chest pain. 04/22/22   Bensimhon, Bevelyn Buckles, MD  ondansetron (ZOFRAN) 4 MG tablet Take 1 tablet (4 mg total) by mouth every 6 (six) hours as needed for nausea or vomiting. 04/22/22 04/22/23  Bensimhon, Bevelyn Buckles, MD  pantoprazole (PROTONIX) 40 MG tablet Take 1 tablet (40 mg total) by mouth daily. 04/22/22 04/22/23  Bensimhon, Bevelyn Buckles, MD  promethazine (PHENERGAN) 25 MG tablet promethazine 25 mg tablet  Take 1 tablet every 4 hours by oral route as needed.    [provider]  sacubitril-valsartan (ENTRESTO) 24-26 MG Take 1 tablet by mouth 2 (two) times daily. 05/26/22   Sabharwal, Aditya, DO  spironolactone (ALDACTONE) 25 MG tablet take 1/2 TABLET BY MOUTH DAILY (morning) 11/28/22   Sabharwal, Aditya, DO  zolpidem (AMBIEN) 10 MG tablet Take 10 mg by mouth at bedtime as needed for sleep. 10/02/20   [provider]      Allergies    Ceclor [cefaclor]    Review of Systems   Review of Systems  Physical Exam Updated Vital Signs BP (!) 152/96 (BP Location: Right Arm)   Pulse (!) 130   Temp 98.7 F (37.1 C) (Oral)   Resp 16   Ht 5\' 4"  (1.626 m)   Wt 100.7 kg   LMP 08/03/2019 (Approximate)   SpO2 99%   BMI 38.11 kg/m  Physical Exam Vitals and nursing note reviewed.  Constitutional:      General: She is not in acute distress.    Appearance: She is well-developed.  HENT:     Head: Normocephalic and atraumatic.     Right Ear: External ear normal.     Left Ear: External ear normal.     Nose: Nose normal.  Eyes:     Extraocular Movements: Extraocular movements intact.     Conjunctiva/sclera: Conjunctivae normal.     Pupils: Pupils are equal, round, and reactive to light.  Cardiovascular:     Rate and Rhythm: Normal rate and regular rhythm.     Heart sounds: No murmur heard. Pulmonary:     Effort: Pulmonary effort is normal. No  respiratory distress.     Breath sounds: Normal breath sounds.  Musculoskeletal:     Cervical back: Normal range of motion and neck supple.     Right lower leg: Edema (1+) present.     Left lower leg: Edema (1+) present.  Skin:    General: Skin is warm and dry.  Neurological:     Mental Status: She is alert and oriented to person, place, and time. Mental status is at baseline.  Psychiatric:        Mood and Affect: Mood normal.     ED Results / Procedures / Treatments   Labs (all labs ordered are listed,  but only abnormal results are displayed) Labs Reviewed  CBC - Abnormal; Notable for the following components:      Result Value   WBC 11.0 (*)    RBC 5.22 (*)    Hemoglobin 15.1 (*)    All other components within normal limits  BASIC METABOLIC PANEL  BRAIN NATRIURETIC PEPTIDE  TROPONIN I (HIGH SENSITIVITY)    EKG None  Radiology No results found.  Procedures Procedures  {Document cardiac monitor, telemetry assessment procedure when appropriate:1}  Medications Ordered in ED Medications - No data to display  ED Course/ Medical Decision Making/ A&P   {   Click here for ABCD2, HEART and other calculatorsREFRESH Note before signing :1}                              Medical Decision Making Amount and/or Complexity of Data Reviewed Labs: ordered. Radiology: ordered.   ***  {Document critical care time when appropriate:1} {Document review of labs and clinical decision tools ie heart score, Chads2Vasc2 etc:1}  {Document your independent review of radiology images, and any outside records:1} {Document your discussion with family members, caretakers, and with consultants:1} {Document social determinants of health affecting pt's care:1} {Document your decision making why or why not admission, treatments were needed:1} Final Clinical Impression(s) / ED Diagnoses Final diagnoses:  None    Rx / DC Orders ED Discharge Orders     None

## 2023-02-23 ENCOUNTER — Other Ambulatory Visit (HOSPITAL_COMMUNITY): Payer: Self-pay | Admitting: Cardiology

## 2023-02-24 ENCOUNTER — Other Ambulatory Visit: Payer: Self-pay

## 2023-02-24 MED ORDER — INSULIN LISPRO 100 UNIT/ML IJ SOLN: INTRAMUSCULAR | 3 refills | Status: DC

## 2023-03-10 ENCOUNTER — Encounter: Payer: Self-pay | Admitting: Internal Medicine

## 2023-03-10 ENCOUNTER — Ambulatory Visit (INDEPENDENT_AMBULATORY_CARE_PROVIDER_SITE_OTHER): Payer: Medicaid Other | Admitting: Internal Medicine

## 2023-03-10 VITALS — BP 130/70 | HR 74 | Ht 64.0 in | Wt 238.0 lb

## 2023-03-10 DIAGNOSIS — R635 Abnormal weight gain: Secondary | ICD-10-CM

## 2023-03-10 DIAGNOSIS — E1165 Type 2 diabetes mellitus with hyperglycemia: Secondary | ICD-10-CM | POA: Diagnosis not present

## 2023-03-10 DIAGNOSIS — E785 Hyperlipidemia, unspecified: Secondary | ICD-10-CM | POA: Diagnosis not present

## 2023-03-10 DIAGNOSIS — Z794 Long term (current) use of insulin: Secondary | ICD-10-CM

## 2023-03-10 DIAGNOSIS — E1142 Type 2 diabetes mellitus with diabetic polyneuropathy: Secondary | ICD-10-CM | POA: Diagnosis not present

## 2023-03-10 LAB — POCT GLYCOSYLATED HEMOGLOBIN (HGB A1C): Hemoglobin A1C: 9.7 % — AB (ref 4.0–5.6)

## 2023-03-10 MED ORDER — FENOFIBRATE 160 MG PO TABS
160.0000 mg | ORAL_TABLET | Freq: Every evening | ORAL | 3 refills | Status: DC
Start: 2023-03-10 — End: 2023-03-23

## 2023-03-10 MED ORDER — DAPAGLIFLOZIN PROPANEDIOL 10 MG PO TABS
10.0000 mg | ORAL_TABLET | Freq: Every day | ORAL | 3 refills | Status: DC
Start: 2023-03-10 — End: 2023-07-05

## 2023-03-10 MED ORDER — METFORMIN HCL ER 500 MG PO TB24
500.0000 mg | ORAL_TABLET | Freq: Two times a day (BID) | ORAL | 3 refills | Status: DC
Start: 2023-03-10 — End: 2023-07-05

## 2023-03-10 MED ORDER — INSULIN LISPRO 100 UNIT/ML IJ SOLN: INTRAMUSCULAR | 11 refills | Status: DC

## 2023-03-10 NOTE — Patient Instructions (Signed)

## 2023-03-10 NOTE — Progress Notes (Signed)
Name: [name]  Age/ Sex: 47 y.o., female   MRN/ DOB: [id number], 21-Nov-1975     PCP: Orland Penman, MD   Reason for Endocrinology Evaluation: Type 2 Diabetes Mellitus  Initial Endocrine Consultative Visit: 10/20/2020    PATIENT IDENTIFIER: [name] is a 47 y.o. female with a past medical history of T2DM, Dyslipidemia, Crohn's disease, CAD, CHF  and HTN. The patient has followed with Endocrinology clinic since 10/20/2020 for consultative assistance with management of her diabetes.  DIABETIC HISTORY:  [name] was diagnosed with DM as gestational diabetes at age 12, by age 68 she was started on Metformin . Insulin was started in her 30's. Her hemoglobin A1c has ranged from 7.9% in 2012, peaking at 12.8% in 2022  ON her initial visit to our clinic  she had an A1c of 12.8 % . She had Metformin, lantus and Humalog on her list but had not taken insulin in 2 months by then. We continued Metformin, started [place] and restarted insulin .  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 She checks her blood sugars multiple times daily through CGM.Marland Kitchen The patient has not had hypoglycemic episodes since the last clinic visit. She is symptomatic with these symptoms   She is accompanied by her daughter today Patient presented to the ED with chest pain 02/17/2023, troponins negative, she has a history of NSTEMI and CHF  She has Crohn's , she is to have chronic diarrhea but now has constipation , and gastroparesis- is[facility]  Patient is complaining about weight gain since  her MI  This patient with type 2 diabetes is treated with omnipod (insulin pump). During the visit the pump basal and bolus doses were reviewed including carb/insulin rations and supplemental doses. The clinical list was updated. The glucose meter download was reviewed in detail to determine if the current pump settings are providing the best glycemic control without excessive hypoglycemia.  Pump and meter download:    Pump   OmniPod Settings   Insulin type   Humalog   Basal rate       0000 1.55u/h    0900 1.55          I:C ratio       0000 1:1 #12 with a meal, #6 with snack                  Sensitivity       0000  25      Goal       0000  120             Type & Model of Pump: OmniPod Insulin Type: Currently using Humalog.  There is no height or weight on file to calculate BMI.  PUMP STATISTICS: Average BG: 233 Average Daily Carbs (g): 16.7 Average Total Daily Insulin: 76.9 Average Daily Basal: 64(83%) Average Daily Bolus: 17 (12.8%)   CONTINUOUS GLUCOSE MONITORING RECORD INTERPRETATION    Dates of Recording:9/21-10/09/2022  Sensor description: Dexcom  Results statistics:   CGM use % of  time 82.8%  Average and SD 233/55  Time in range 17  %  % Time Above 180 49  % Time above 250 34  % Time Below target 0   Glycemic patterns summary: BGs are high overnight  Hyperglycemic episodes postprandial  Hypoglycemic episodes occurred N/A  Overnight periods: Mostly high   HOME DIABETES REGIMEN:  Metformin 500 mg , 1 tablet Twice daily  Farxiga 10 mg, 1 tablet in the morning  Humalog      Statin: yes ACE-I/ARB: yes Prior Diabetic Education: yes     DIABETIC COMPLICATIONS: Microvascular complications:  neuropathy Denies: CKD, retinopathy Last Eye Exam: Completed 10/2020  Macrovascular complications:   Denies: CAD, CVA, PVD   HISTORY:  Past Medical History:  Past Medical History:  Diagnosis Date   Anxiety    Crohn's disease (HCC)     Diabetes mellitus    DKA (diabetic ketoacidosis) (HCC)    Hypertension    Insomnia    Neuropathy    Past Surgical History:  Past Surgical History:  Procedure Laterality Date   COLONOSCOPY     HERNIA REPAIR     RECTAL SURGERY     RIGHT/LEFT HEART CATH AND CORONARY ANGIOGRAPHY N/A 04/21/2022   Procedure: RIGHT/LEFT HEART CATH AND CORONARY ANGIOGRAPHY;  Surgeon: Orpah Cobb, MD;  Location: [facility];  Service: Cardiovascular;  Laterality: N/A;   Social History:  reports that she has been smoking cigarettes. She has never used smokeless tobacco. She reports current drug use. Drug: Marijuana. She reports that she does not drink alcohol. Family History:  Family History  Problem Relation Age of Onset   Heart disease Father    Crohn's disease Father    Colon cancer Neg Hx    Rectal cancer Neg Hx    Esophageal cancer Neg Hx      HOME MEDICATIONS: Allergies as of 03/10/2023       Reactions   Ceclor [cefaclor] Hives        Medication List        Accurate as of March 10, 2023  6:54 AM. If you have any questions, ask your nurse or doctor.          Accu-Chek Guide Me w/Device Kit 1 Device by Does not apply route in the morning, at noon, in the evening, and at bedtime.   Accu-Chek Guide test strip Generic drug: glucose blood 1 each by Other route in the morning, at noon, in the evening, and at bedtime. Use as instructed   acetaminophen 325 MG tablet Commonly known as: TYLENOL Take 650 mg by mouth every 6 (six) hours as needed for mild pain or moderate pain.   aspirin EC 81 MG tablet Take 1 tablet (81 mg total) by mouth daily. Swallow whole.   atorvastatin 80 MG tablet Commonly known as: LIPITOR TAKE 1 Tablet BY MOUTH ONCE DAILY at bedtime (bedtime)   dapagliflozin propanediol 10 MG Tabs tablet Commonly known as: Farxiga Take 1 tablet (10 mg total) by mouth daily.   Dexcom G6 Sensor Misc USE 1 device AS DIRECTED   Entresto 24-26 MG Generic drug:  sacubitril-valsartan Take 1 tablet by mouth 2 (two) times daily.   escitalopram 20 MG tablet Commonly known as: LEXAPRO Take 20 mg by mouth daily.   fenofibrate 160 MG tablet Take 1 tablet (160 mg total) by mouth every evening.   furosemide 20 MG tablet Commonly known as: LASIX TAKE TWO TABLETS BY MOUTH DAILY AS NEEDED (BOTTLE)   gabapentin 300  MG capsule Commonly known as: NEURONTIN Take 300 mg by mouth 3 (three) times daily.   insulin lispro 100 UNIT/ML KwikPen Commonly known as: HumaLOG KwikPen Max daily 80 units per pump   insulin lispro 100 UNIT/ML injection Commonly known as: HumaLOG Max daily 80 units per pump   Insulin Pen Needle 32G X 4 MM Misc 1 Device by Does not apply route in the morning, at noon, in the evening, and at bedtime.   Lantus SoloStar 100 UNIT/ML Solostar Pen Generic drug: insulin glargine Inject 50 Units into the skin daily.   lidocaine 5 % Commonly known as: Lidoderm Place 1 patch onto the skin daily. Remove & Discard patch within 12 hours or as directed by MD   metFORMIN 500 MG 24 hr tablet Commonly known as: GLUCOPHAGE-XR Take 1 tablet (500 mg total) by mouth 2 (two) times daily with a meal.   metoprolol succinate 25 MG 24 hr tablet Commonly known as: TOPROL-XL take 1/2 TABLET BY MOUTH DAILY (morning)   nitroGLYCERIN 0.4 MG SL tablet Commonly known as: NITROSTAT Place 1 tablet (0.4 mg total) under the tongue every 5 (five) minutes x 3 doses as needed for chest pain.   Omnipod 5 G6 Intro (Gen 5) Kit 1 Device by Does not apply route every other day.   Omnipod 5 G6 Pods (Gen 5) Misc Use as directed EVERY OTHER DAY   ondansetron 4 MG tablet Commonly known as: Zofran Take 1 tablet (4 mg total) by mouth every 6 (six) hours as needed for nausea or vomiting.   pantoprazole 40 MG tablet Commonly known as: Protonix Take 1 tablet (40 mg total) by mouth daily.   promethazine 25 MG tablet Commonly known as: PHENERGAN promethazine 25 mg  tablet  Take 1 tablet every 4 hours by oral route as needed.   spironolactone 25 MG tablet Commonly known as: ALDACTONE take 1/2 TABLET BY MOUTH DAILY (morning)   zolpidem 10 MG tablet Commonly known as: AMBIEN Take 10 mg by mouth at bedtime as needed for sleep.         OBJECTIVE:   Vital Signs: LMP 08/03/2019 (Approximate)   Wt Readings from Last 3 Encounters:  02/17/23 222 lb 0.1 oz (100.7 kg)  09/07/22 222 lb (100.7 kg)  06/28/22 206 lb (93.4 kg)     Exam: General: Pt appears well and is in NAD  Lungs: Clear with good BS bilat   Heart: RRR  Extremities: No pretibial edema.   Neuro: MS is good with appropriate affect, pt is alert and Ox3   DM foot exam:09/07/2022   The skin of the feet is  without sores or ulcerations. The pedal pulses are 2+ on right and 2+ on left. The sensation is absent  to a screening 5.07, 10 gram monofilament bilaterally        DATA REVIEWED:  Lab Results  Component Value Date   HGBA1C 12.8 (A) 10/20/2020   HGBA1C (H) 08/05/2010    Latest Reference Range & Units 09/07/22 09:41  Sodium 135 - 145 mEq/L 137  Potassium 3.5 - 5.1 mEq/L 4.0  Chloride 96 - 112 mEq/L 105  CO2 19 - 32 mEq/L 24  Glucose 70 - 99 mg/dL 829 (H)  BUN 6 - 23 mg/dL 14  Creatinine 5.62 - 1.30 mg/dL 8.65  Calcium 8.4 - 78.4 mg/dL 9.0  GFR >69.62 mL/min 110.52    Latest Reference Range & Units 09/07/22 09:41  Total CHOL/HDL Ratio  4  Cholesterol 0 - 200 mg/dL  157  HDL Cholesterol >39.00 mg/dL 40.98 (L)  Direct LDL mg/dL 11.9  MICROALB/CREAT RATIO 0.0 - 30.0 mg/g 6.8  NonHDL  121.83  Triglycerides 0.0 - 149.0 mg/dL 147.8 (H)  VLDL 0.0 - 29.5 mg/dL 62.1 (H)    Latest Reference Range & Units 09/07/22 09:41  Creatinine,U mg/dL 30.8  Microalb, Ur 0.0 - 1.9 mg/dL 3.8 (H)  MICROALB/CREAT RATIO 0.0 - 30.0 mg/g 6.8    ASSESSMENT / PLAN / RECOMMENDATIONS:   1) Type 2 Diabetes Mellitus, Poorly controlled, With neuropathic complications and microabuminuria -  Most recent A1c of 9.7 %. Goal A1c < 7.0 %.    -Patient continues with worsening glycemic control -In reviewing his CGM/pump download, the patient has not been consistently bolusing for meals nor snacks, her BG last night around 9 PM was 150 Mg/DL, but this morning is 657 mg/DL, this is because the patient she ate supper last night without any bolusing, we again discussed the importance of entering 12 g of carbohydrates with meals and 6 g of carbohydrates with meals -She also has entered 12 g this morning at 6:41 AM withOUT eating breakfast, I have discouraged the patient from this practice and we again discussed the importance of entering carbohydrates with meals and she was also shown how to enter her BG's into the pump so she can receive a correction bolus for hyperglycemia, as she was not aware how to do this - Intolerant to higher doses of metformin - I will adjust her sensitivity factor -I have also increased her basal rate overnight -NOT a candidate for GLP-1 agonist due to gastroparesis   MEDICATIONS: Continue Metformin 500 mg , 1 tablet Twice daily  Continue  Farxiga 10  mg, 1 tablet in the morning      Pump   OmniPod Settings   Insulin type   Humalog   Basal rate       0000 1.60u/h    0900 1.55          I:C ratio       0000 1:1 #12 with a meal, #6 with snack                  Sensitivity       0000  20      Goal       0000  120          EDUCATION / INSTRUCTIONS: BG monitoring instructions: Patient is instructed to check her blood sugars 3 times a day, before each meal . Call [facility] if: BG persistently < 70  I reviewed the Rule of 15 for the treatment of hypoglycemia in detail with the patient. Literature supplied.   2) Diabetic complications:  Eye: Does not have known diabetic retinopathy.  Neuro/ Feet: Does have known diabetic peripheral neuropathy .  Renal: Patient does not have known baseline CKD. She   is not on an ACEI/ARB at  present.     3) Dyslipidemia:    -Her LDL has trended down to 72 mg/DL -TG elevated, and was started on fenofibrate/2024   Medication Continue atorvastatin 80 mg daily  Continue fenofibrate 160 mg daily  4) Weight Gain:  -A referral to weight management clinic has been placed   F/U in 3 months     Signed electronically by: Lyndle Herrlich, MD  [facility] Endocrinology  [facility] [address] [place], [place] [address] Phone: [phone] FAX: [phone]   CC: Jaslynne Dahan, Konrad Dolores,  MD [address] [place] [address] Phone: [phone]  Fax: [phone]  Return to Endocrinology clinic as below: Future Appointments  Date Time Provider Department Center  03/10/2023  8:30 AM Wilhelmena Zea, Konrad Dolores, MD [facility] None  03/21/2023  9:00 AM Dorthula Nettles, DO [facility] None

## 2023-03-20 ENCOUNTER — Encounter: Payer: Self-pay | Admitting: Internal Medicine

## 2023-03-21 ENCOUNTER — Encounter (HOSPITAL_COMMUNITY): Payer: Self-pay | Admitting: Cardiology

## 2023-03-21 ENCOUNTER — Other Ambulatory Visit (HOSPITAL_COMMUNITY): Payer: Self-pay | Admitting: Cardiology

## 2023-03-21 ENCOUNTER — Other Ambulatory Visit (HOSPITAL_COMMUNITY): Payer: Self-pay

## 2023-03-21 ENCOUNTER — Telehealth: Payer: Self-pay

## 2023-03-21 ENCOUNTER — Ambulatory Visit (HOSPITAL_COMMUNITY)
Admission: RE | Admit: 2023-03-21 | Discharge: 2023-03-21 | Disposition: A | Discharge status: Home / Self Care | Payer: Medicaid Other | Source: Ambulatory Visit | Attending: Cardiology | Admitting: Cardiology

## 2023-03-21 VITALS — BP 140/94 | HR 95 | Wt 241.0 lb

## 2023-03-21 DIAGNOSIS — I5022 Chronic systolic (congestive) heart failure: Secondary | ICD-10-CM | POA: Diagnosis not present

## 2023-03-21 DIAGNOSIS — Z79899 Other long term (current) drug therapy: Secondary | ICD-10-CM | POA: Diagnosis not present

## 2023-03-21 DIAGNOSIS — I10 Essential (primary) hypertension: Secondary | ICD-10-CM | POA: Diagnosis not present

## 2023-03-21 DIAGNOSIS — K3184 Gastroparesis: Secondary | ICD-10-CM | POA: Diagnosis not present

## 2023-03-21 DIAGNOSIS — Z794 Long term (current) use of insulin: Secondary | ICD-10-CM | POA: Diagnosis not present

## 2023-03-21 DIAGNOSIS — I110 Hypertensive heart disease with heart failure: Secondary | ICD-10-CM | POA: Insufficient documentation

## 2023-03-21 DIAGNOSIS — Z7984 Long term (current) use of oral hypoglycemic drugs: Secondary | ICD-10-CM | POA: Insufficient documentation

## 2023-03-21 DIAGNOSIS — Z8611 Personal history of tuberculosis: Secondary | ICD-10-CM | POA: Insufficient documentation

## 2023-03-21 DIAGNOSIS — K50919 Crohn's disease, unspecified, with unspecified complications: Secondary | ICD-10-CM

## 2023-03-21 DIAGNOSIS — R5383 Other fatigue: Secondary | ICD-10-CM | POA: Insufficient documentation

## 2023-03-21 DIAGNOSIS — K5090 Crohn's disease, unspecified, without complications: Secondary | ICD-10-CM | POA: Insufficient documentation

## 2023-03-21 DIAGNOSIS — E1143 Type 2 diabetes mellitus with diabetic autonomic (poly)neuropathy: Secondary | ICD-10-CM | POA: Insufficient documentation

## 2023-03-21 DIAGNOSIS — Z6841 Body Mass Index (BMI) 40.0 and over, adult: Secondary | ICD-10-CM | POA: Diagnosis not present

## 2023-03-21 DIAGNOSIS — E669 Obesity, unspecified: Secondary | ICD-10-CM | POA: Diagnosis not present

## 2023-03-21 DIAGNOSIS — F17210 Nicotine dependence, cigarettes, uncomplicated: Secondary | ICD-10-CM | POA: Diagnosis not present

## 2023-03-21 LAB — BRAIN NATRIURETIC PEPTIDE: B Natriuretic Peptide: 23.7 pg/mL (ref 0.0–100.0)

## 2023-03-21 LAB — BASIC METABOLIC PANEL
Anion gap: 14 (ref 5–15)
BUN: 9 mg/dL (ref 6–20)
CO2: 20 mmol/L — ABNORMAL LOW (ref 22–32)
Calcium: 9.1 mg/dL (ref 8.9–10.3)
Chloride: 104 mmol/L (ref 98–111)
Creatinine, Ser: 0.67 mg/dL (ref 0.44–1.00)
GFR, Estimated: >60 mL/min (ref >60)
Glucose, Bld: 295 mg/dL — ABNORMAL HIGH (ref 70–99)
Potassium: 3.7 mmol/L (ref 3.5–5.1)
Sodium: 138 mmol/L (ref 135–145)

## 2023-03-21 MED ORDER — SPIRONOLACTONE 25 MG PO TABS: 25.0000 mg | ORAL_TABLET | Freq: Every day | ORAL | 5 refills | Status: DC

## 2023-03-21 MED ORDER — FUROSEMIDE 20 MG PO TABS: 20.0000 mg | ORAL_TABLET | Freq: Every day | ORAL | 5 refills | Status: DC

## 2023-03-21 NOTE — Telephone Encounter (Signed)
Pharmacy Patient Advocate Encounter   Received notification from Pt Calls Messages that prior authorization for Fenofibrate is required/requested.   Per test claim: PA required; PA submitted to [facility] via CoverMyMeds Key/confirmation #/[id number] Status is pending

## 2023-03-21 NOTE — Telephone Encounter (Signed)
Pharmacy need to verify that Metformin has been changed from ER

## 2023-03-21 NOTE — Patient Instructions (Signed)
Medication Changes:  INCREASE SPIRONOLACTONE TO 25MG  ONCE DAILY   START: FUROSEMIDE (LASIX) 20MG  ONCE DAILY   Lab Work:  Labs done today, your results will be available in MyChart, we will contact you for abnormal readings.  Testing/Procedures:  Your physician has requested that you have an echocardiogram. Echocardiography is a painless test that uses sound waves to create images of your heart. It provides your doctor with information about the size and shape of your heart and how well your heart's chambers and valves are working. This procedure takes approximately one hour. There are no restrictions for this procedure. Please do NOT wear cologne, perfume, aftershave, or lotions (deodorant is allowed). Please arrive 15 minutes prior to your appointment time.  Follow-Up in: 6 MONTHS PLEASE CALL OUR OFFICE AROUND FEBRUARY 2025 TO GET SCHEDULED FOR YOUR APPOINTMENT. PHONE NUMBER IS [phone] OPTION 2    At the [facility], you and your health needs are our priority. We have a designated team specialized in the treatment of Heart Failure. This Care Team includes your primary Heart Failure Specialized Cardiologist (physician), Advanced Practice Providers (APPs- Physician Assistants and Nurse Practitioners), and Pharmacist who all work together to provide you with the care you need, when you need it.   You may see any of the following providers on your designated Care Team at your next follow up:  Dr. Arvilla Meres Dr. Marca Ancona Dr. Dorthula Nettles Dr. Theresia Bough Tonye Becket, NP Robbie Lis, [place] [facility] [place], [place] Brynda Peon, NP [place] Lee, NP Karle Plumber, PharmD   Please be sure to bring in all your medications bottles to every appointment.   Need to Contact us:  If you have any questions or concerns before your next appointment please send [place] a message through [place] or call our office at [phone].    TO LEAVE A MESSAGE FOR  THE NURSE SELECT OPTION 2, PLEASE LEAVE A MESSAGE INCLUDING: YOUR NAME DATE OF BIRTH CALL BACK NUMBER REASON FOR CALL**this is important as we prioritize the call backs  YOU WILL RECEIVE A CALL BACK THE SAME DAY AS LONG AS YOU CALL BEFORE 4:00 PM

## 2023-03-21 NOTE — Telephone Encounter (Signed)
Fenofibrate needs PA

## 2023-03-21 NOTE — Progress Notes (Signed)
ADVANCED HEART FAILURE CLINIC NOTE  Referring Physician: Aaron Mose*  Primary Care: Shamleffer, Konrad Dolores, MD Primary Cardiologist: Dr. Gasper Lloyd  HPI: [name] is a 47 y.o. female with T2DM, Crohn's disease, hx of TB, tobacco use that presented to [facility] in 11/23 with N/V, elevated hs-troponin and TTE w/ newly reduced LVEF (25%); coronary angiography during admission w/o obstructive CAD and RHC w/ preserved CI. She was diuresed, started on low dose GDMT and discharged home. Shortly after starting GDMT, she had recovery of LVEF by TTE on 07/05/22.   Interval hx:  She reports feeling very fatigued although this has been chronic due to her underlying Crohns. She has also been struggling with weight gain over the past several months; reports >40lb weight gain. She believe some of this is due to volume overload and has started taking lasix 80mg  1-2 days weekly. No significant dyspnea, PND or orthopnea during this time.   Activity level/exercise tolerance:  NYHA III but limited predominantly by underlying inflammatory disease and pain. Orthopnea:  Sleeps on 2 pillows Paroxysmal noctural dyspnea:  No Chest pain/pressure:  yes Orthostatic lightheadedness:  no Palpitations:  no Lower extremity edema:  no Presyncope/syncope:  no Cough:  no  Past Medical History:  Diagnosis Date   Anxiety    Crohn's disease (HCC)    Diabetes mellitus    DKA (diabetic ketoacidosis) (HCC)    Hypertension    Insomnia    Neuropathy     Current Outpatient Medications  Medication Sig Dispense Refill   ACCU-CHEK GUIDE test strip 1 each by Other route in the morning, at noon, in the evening, and at bedtime. Use as instructed 400 each 3   acetaminophen (TYLENOL) 325 MG tablet Take 650 mg by mouth every 6 (six) hours as needed for mild pain or moderate pain.     aspirin EC 81 MG tablet Take 1 tablet (81 mg total) by mouth daily. Swallow whole. 30 tablet 12   atorvastatin (LIPITOR)  80 MG tablet TAKE 1 Tablet BY MOUTH ONCE DAILY at bedtime (bedtime) 30 tablet 0   Blood Glucose Monitoring Suppl (ACCU-CHEK GUIDE ME) w/Device KIT 1 Device by Does not apply route in the morning, at noon, in the evening, and at bedtime. 1 kit 0   carisoprodol (SOMA) 350 MG tablet Soma 350 mg tablet Take 1 tablet 3 times a day by oral route as needed for 7 days.     Continuous Glucose Sensor (DEXCOM G6 SENSOR) MISC USE 1 device AS DIRECTED 9 each 1   dapagliflozin propanediol (FARXIGA) 10 MG TABS tablet Take 1 tablet (10 mg total) by mouth daily. 90 tablet 3   escitalopram (LEXAPRO) 20 MG tablet Take 20 mg by mouth daily.     fenofibrate 160 MG tablet Take 1 tablet (160 mg total) by mouth every evening. 90 tablet 3   gabapentin (NEURONTIN) 300 MG capsule Take 300 mg by mouth 3 (three) times daily.     Insulin Disposable Pump (OMNIPOD 5 G6 INTRO, GEN 5,) KIT 1 Device by Does not apply route every other day. 1 kit 0   Insulin Disposable Pump (OMNIPOD 5 G6 PODS, GEN 5,) MISC Use as directed EVERY OTHER DAY 15 each 3   insulin glargine (LANTUS SOLOSTAR) 100 UNIT/ML Solostar Pen Inject 50 Units into the skin daily. 45 mL 1   insulin lispro (HUMALOG KWIKPEN) 100 UNIT/ML KwikPen Max daily 80 units per pump 80 mL 2   insulin lispro (HUMALOG) 100 UNIT/ML  injection Max daily 80 units per pump 30 mL 11   Insulin Pen Needle 32G X 4 MM MISC 1 Device by Does not apply route in the morning, at noon, in the evening, and at bedtime. 400 each 1   lidocaine (LIDODERM) 5 % Place 1 patch onto the skin daily. Remove & Discard patch within 12 hours or as directed by MD 30 patch 0   metFORMIN (GLUCOPHAGE-XR) 500 MG 24 hr tablet Take 1 tablet (500 mg total) by mouth 2 (two) times daily with a meal. 180 tablet 3   metoprolol succinate (TOPROL-XL) 25 MG 24 hr tablet take 1/2 TABLET BY MOUTH DAILY (morning) 45 tablet 3   nitroGLYCERIN (NITROSTAT) 0.4 MG SL tablet Place 1 tablet (0.4 mg total) under the tongue every 5 (five)  minutes x 3 doses as needed for chest pain. 25 tablet 3   ondansetron (ZOFRAN) 4 MG tablet Take 1 tablet (4 mg total) by mouth every 6 (six) hours as needed for nausea or vomiting. 60 tablet 1   oxyCODONE (OXY IR/ROXICODONE) 5 MG immediate release tablet oxycodone 5 mg tablet TAKE 1 TABLET BY MOUTH AT BEDTIME     pantoprazole (PROTONIX) 40 MG tablet Take 1 tablet (40 mg total) by mouth daily. 30 tablet 1   promethazine (PHENERGAN) 25 MG suppository Place rectally.     promethazine (PHENERGAN) 25 MG tablet promethazine 25 mg tablet  Take 1 tablet every 4 hours by oral route as needed.     sacubitril-valsartan (ENTRESTO) 24-26 MG Take 1 tablet by mouth 2 (two) times daily. 60 tablet 11   tiZANidine (ZANAFLEX) 4 MG tablet tizanidine 4 mg tablet TAKE 1 TABLET BY MOUTH EVERY 8 HOURS     traMADol (ULTRAM) 50 MG tablet tramadol 50 mg tablet TAKE 1 TABLET BY MOUTH EVERY 6 HOURS AS NEEDED     zolpidem (AMBIEN) 10 MG tablet Take 10 mg by mouth at bedtime as needed for sleep.     furosemide (LASIX) 20 MG tablet Take 1 tablet (20 mg total) by mouth daily. 30 tablet 5   spironolactone (ALDACTONE) 25 MG tablet Take 1 tablet (25 mg total) by mouth daily. 30 tablet 5   No current facility-administered medications for this encounter.    Allergies  Allergen Reactions   Ceclor [Cefaclor] Hives      Social History   Socioeconomic History   Marital status: Divorced    Spouse name: Not on file   Number of children: 6   Years of education: Not on file   Highest education level: Not on file  Occupational History   Occupation: Disable  Tobacco Use   Smoking status: Every Day    Current packs/day: 0.50    Types: Cigarettes   Smokeless tobacco: Never  Vaping Use   Vaping status: Former  Substance and Sexual Activity   Alcohol use: No   Drug use: Yes    Types: Marijuana   Sexual activity: Not on file  Other Topics Concern   Not on file  Social History Narrative   ** Merged History Encounter **        Social Determinants of Health   Financial Resource Strain: Not on file  Food Insecurity: No Food Insecurity (04/21/2022)   Hunger Vital Sign    Worried About Running Out of Food in the Last Year: Never true    Ran Out of Food in the Last Year: Never true  Transportation Needs: No Transportation Needs (04/21/2022)   PRAPARE - Transportation  Lack of Transportation (Medical): No    Lack of Transportation (Non-Medical): No  Physical Activity: Not on file  Stress: Not on file  Social Connections: Unknown (10/19/2021)   Received from [facility]   Social Network    Social Network: Not on file  Intimate Partner Violence: Not At Risk (04/21/2022)   Humiliation, Afraid, Rape, and Kick questionnaire    Fear of Current or Ex-Partner: No    Emotionally Abused: No    Physically Abused: No    Sexually Abused: No      Family History  Problem Relation Age of Onset   Heart disease Father    Crohn's disease Father    Colon cancer Neg Hx    Rectal cancer Neg Hx    Esophageal cancer Neg Hx     PHYSICAL EXAM: Vitals:   03/21/23 0907  BP: (!) 140/94  Pulse: 95  SpO2: 99%   GENERAL: Well nourished, well developed, and in no apparent distress at rest.  HEENT: Negative for arcus senilis or xanthelasma. There is no scleral icterus.  The mucous membranes are pink and moist.   NECK: Supple, No masses. Normal carotid upstrokes without bruits. No masses or thyromegaly.    CHEST: There are no chest wall deformities. There is no chest wall tenderness. Respirations are unlabored.  Lungs- CTA B/L CARDIAC:  JVP: difficult to assess, does not appear severely elevated          Normal rate with regular rhythm. No murmurs, rubs or gallops.  Pulses are 2+ and symmetrical in upper and lower extremities. No edema.  ABDOMEN: Soft, non-tender, non-distended. There are no masses or hepatomegaly. There are normal bowel sounds.  EXTREMITIES: Warm and well perfused with no cyanosis,  clubbing.  LYMPHATIC: No axillary or supraclavicular lymphadenopathy.  NEUROLOGIC: Patient is oriented x3 with no focal or lateralizing neurologic deficits.  PSYCH: Patients affect is appropriate, there is no evidence of anxiety or depression.  SKIN: Warm and dry; no lesions or wounds.   DATA REVIEW  ECG: 05/02/22: NSR  ECHO: 04/19/22: LVEF 25%-30%, G2DD, normal RV function 06/14/2022: Bedside ultrasound today in clinic with normal LV function and preserved RV function. 07/05/22: normal LV function  CATH: RHC/LHC, 04/21/22:    Prox LAD lesion is 25% stenosed.   Mid LAD lesion is 30% stenosed.   Prox RCA lesion is 50% stenosed.   There is severe left ventricular systolic dysfunction.   LV end diastolic pressure is normal.   The left ventricular ejection fraction is less than 25% by visual estimate.  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 No significant FH of heart failure. Recovery of LVEF on TTE from 07/05/22.  NYHA class / AHA Stage:III, I believe her underlying gastroparesis and Crohn's/inflammatory disease is a large component of her functional limitations. Volume status & Diuretics:  Taking lasix 80mg  1-2 days weekly. Will try lasix 20mg  daily. Will repeat echocardiogram.  Vasodilators : Continue Entresto 24/26 mg twice daily Beta-Blocker: Continue Toprol 12.5 mg daily. D/C digoxin with improvement in LVEF.  MRA: increase to 25mg  daily; repeat BMP/BNP today.  Cardiometabolic:farxiga 10mg  daily Devices therapies & Valvulopathies:not indicated Advanced therapies: Not indicated.   2. Crohn's Disease -Following at [facility] - Reports no recent flares  3. Hx of TB - Reports that she was briefly treated, however, could not complete treatment due to rise in LFTs and significant difficulty  with associated symptoms?  - Reportedly no longer a candidate for  biologics for Crohn's management due to underlying hx of untreated latent TB.  4. Obesity  - Body mass index is 41.37 kg/m. - previously on ozemipic but stopped due to severe intractable nausea/vomiting.  - Will refer to weight clinic.   5. Hypertension - BP elevated today; compliant with Entresto - Reports that BP is well controlled outside the hospital. Will obtain ambulatory log and adjust meds accordingly.  - BMP/BNP today.   I spent 41 minutes caring for this patient today including face to face time, ordering and reviewing labs, reviewing records from recent ER visit, seeing the patient, documenting in the record, and arranging follow ups.   Natia Fahmy Advanced Heart Failure Mechanical Circulatory Support

## 2023-03-23 ENCOUNTER — Other Ambulatory Visit: Payer: Self-pay

## 2023-03-23 DIAGNOSIS — E785 Hyperlipidemia, unspecified: Secondary | ICD-10-CM

## 2023-03-23 MED ORDER — FENOFIBRATE 145 MG PO TABS: 145.0000 mg | ORAL_TABLET | Freq: Every day | ORAL | 3 refills | Status: DC

## 2023-03-23 NOTE — Telephone Encounter (Signed)
Fenofibrate 145mg  is covered by patient insurance per [facility] if okay send new scrip.

## 2023-03-31 NOTE — Telephone Encounter (Signed)
Pharmacy Patient Advocate Encounter  Received notification from [facility] that Prior Authorization for Fenofibrate has been DENIED.  See denial reason below. No denial letter attached in CMM. Will attache denial letter to Media tab once received.    PA #/Case ID/Reference #: [id number]

## 2023-04-24 ENCOUNTER — Other Ambulatory Visit (HOSPITAL_COMMUNITY): Payer: Self-pay | Admitting: Cardiology

## 2023-05-03 ENCOUNTER — Other Ambulatory Visit: Payer: Self-pay | Admitting: Internal Medicine

## 2023-05-03 ENCOUNTER — Ambulatory Visit (HOSPITAL_COMMUNITY)
Admission: RE | Admit: 2023-05-03 | Discharge: 2023-05-03 | Disposition: A | Discharge status: Home / Self Care | Payer: Medicaid Other | Source: Ambulatory Visit | Attending: Cardiology | Admitting: Cardiology

## 2023-05-03 DIAGNOSIS — E785 Hyperlipidemia, unspecified: Secondary | ICD-10-CM | POA: Insufficient documentation

## 2023-05-03 DIAGNOSIS — I509 Heart failure, unspecified: Secondary | ICD-10-CM | POA: Diagnosis not present

## 2023-05-03 DIAGNOSIS — I110 Hypertensive heart disease with heart failure: Secondary | ICD-10-CM | POA: Insufficient documentation

## 2023-05-03 DIAGNOSIS — I5022 Chronic systolic (congestive) heart failure: Secondary | ICD-10-CM | POA: Diagnosis not present

## 2023-05-03 DIAGNOSIS — E119 Type 2 diabetes mellitus without complications: Secondary | ICD-10-CM | POA: Insufficient documentation

## 2023-05-03 DIAGNOSIS — F17200 Nicotine dependence, unspecified, uncomplicated: Secondary | ICD-10-CM | POA: Insufficient documentation

## 2023-05-03 DIAGNOSIS — I252 Old myocardial infarction: Secondary | ICD-10-CM | POA: Insufficient documentation

## 2023-05-03 DIAGNOSIS — I2510 Atherosclerotic heart disease of native coronary artery without angina pectoris: Secondary | ICD-10-CM | POA: Insufficient documentation

## 2023-05-03 LAB — ECHOCARDIOGRAM COMPLETE
AR max vel: 2.44 cm2
AV Area VTI: 2.66 cm2
AV Area mean vel: 2.41 cm2
AV Mean grad: 3 mm[Hg]
AV Peak grad: 5.5 mm[Hg]
Ao pk vel: 1.17 m/s
Area-P 1/2: 3.68 cm2
S' Lateral: 2.6 cm

## 2023-05-05 ENCOUNTER — Other Ambulatory Visit: Payer: Self-pay | Admitting: Internal Medicine

## 2023-05-05 DIAGNOSIS — E119 Type 2 diabetes mellitus without complications: Secondary | ICD-10-CM

## 2023-05-18 ENCOUNTER — Other Ambulatory Visit: Payer: Self-pay | Admitting: Internal Medicine

## 2023-05-18 IMAGING — CT CT ABD-PELV W/O CM
2 of 4 series · 16 of 46 positions shown, 18 images · non-contrast
Comparison: CT abdomen/pelvis 11/16/2016

CLINICAL DATA: Nausea, vomiting

EXAM:
CT ABDOMEN AND PELVIS WITHOUT CONTRAST
TECHNIQUE: Multidetector CT imaging of the abdomen and pelvis was performed
following the standard protocol without IV contrast.

[Series 2: axial st · axial · 0.83mm/px · z∈[+1058,+1478]mm · 13 of 96 slices shown, 15 images]
[im 6/96  soft-tissue]
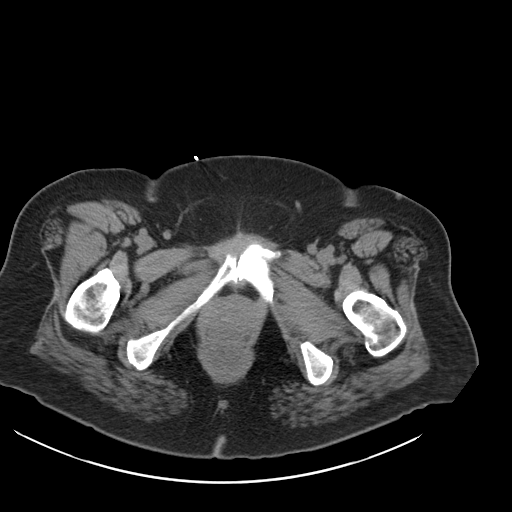
[im 6/96  bone]
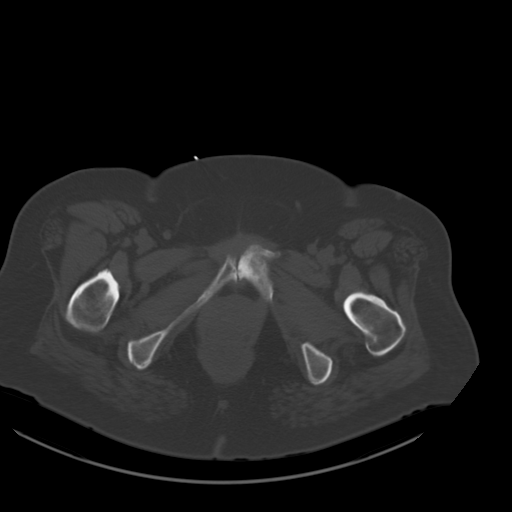
[im 11/96  soft-tissue]
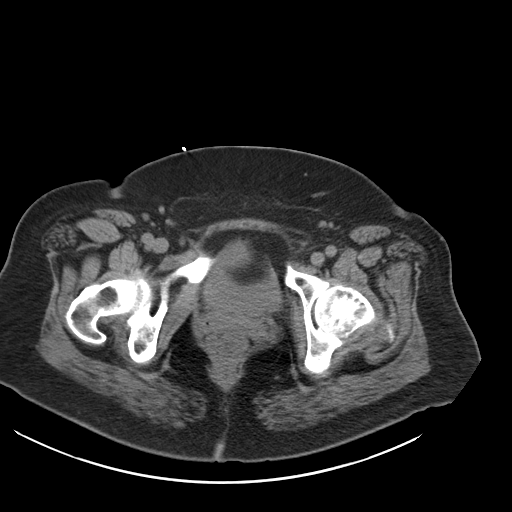
[im 22/96  soft-tissue]
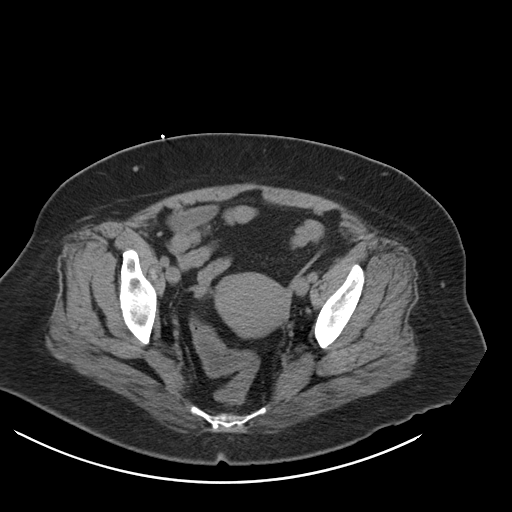
[im 27/96  soft-tissue]
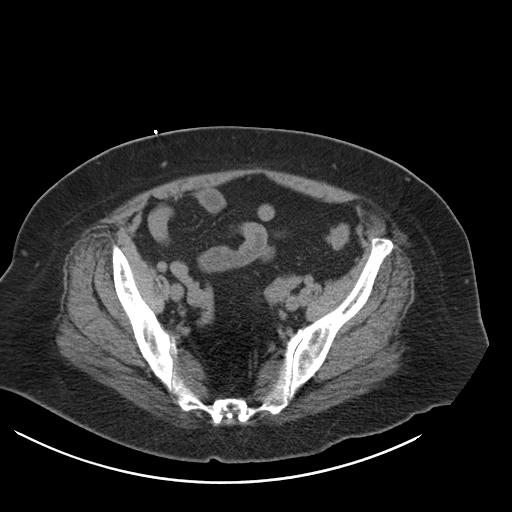
[im 32/96  soft-tissue]
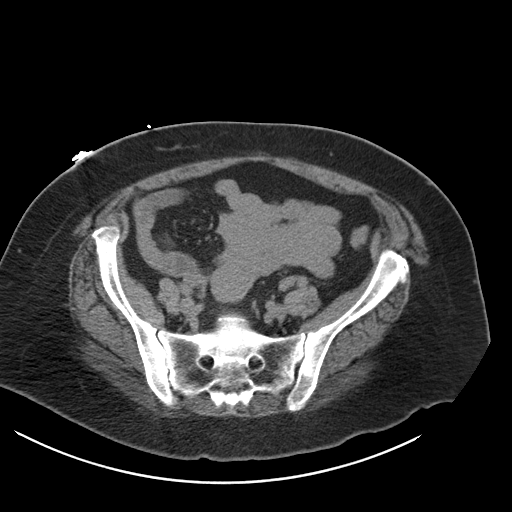
[im 43/96  soft-tissue]
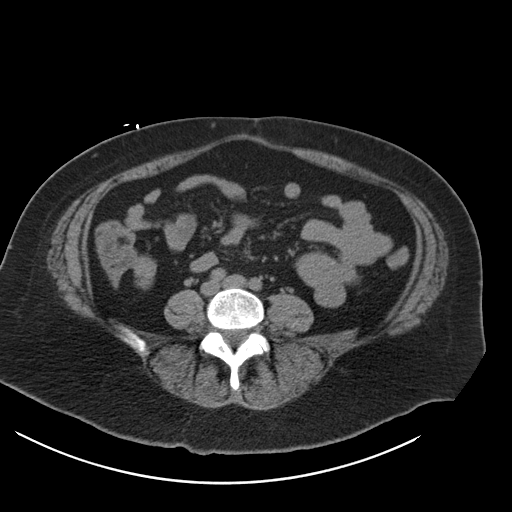
[im 48/96  soft-tissue]
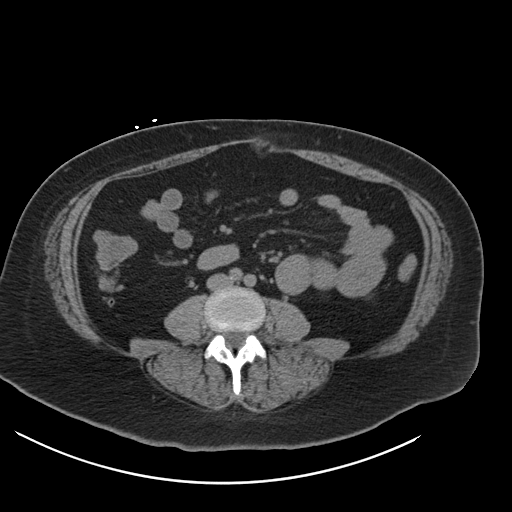
[im 53/96  soft-tissue]
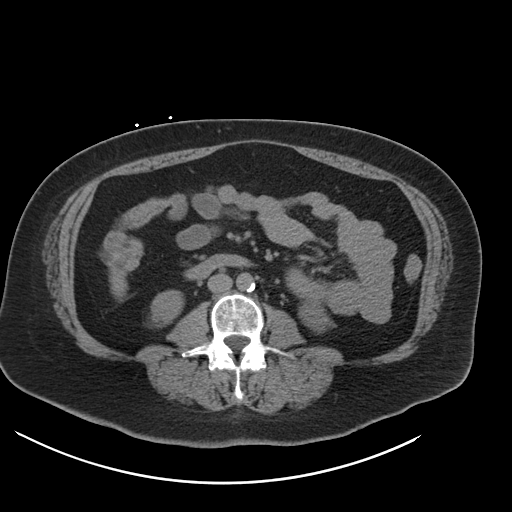
[im 64/96  soft-tissue]
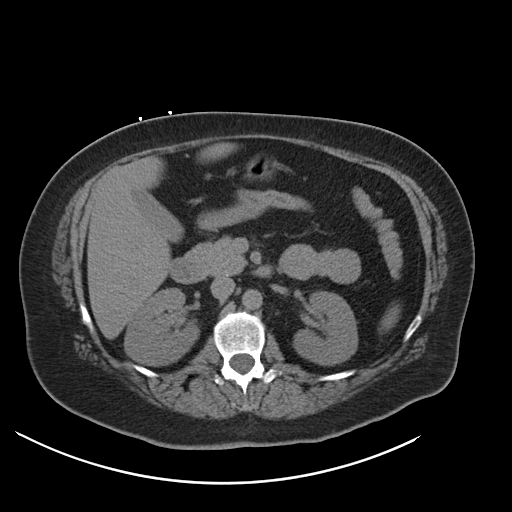
[im 64/96  bone]
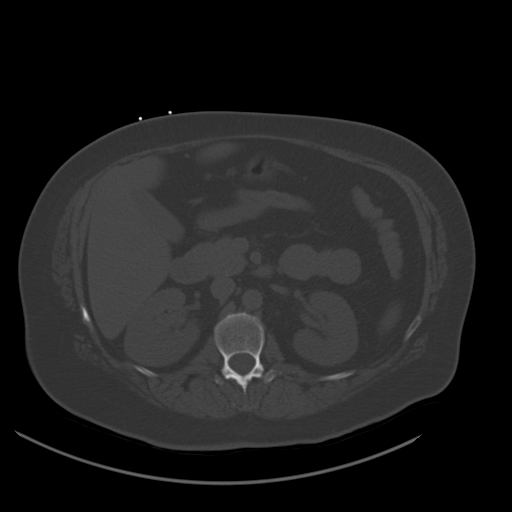
[im 69/96  soft-tissue]
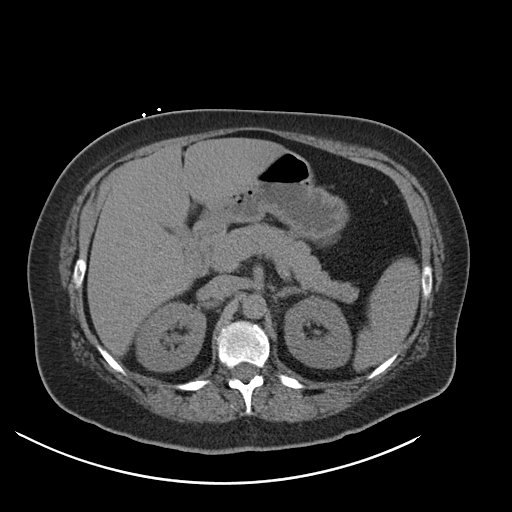
[im 74/96  soft-tissue]
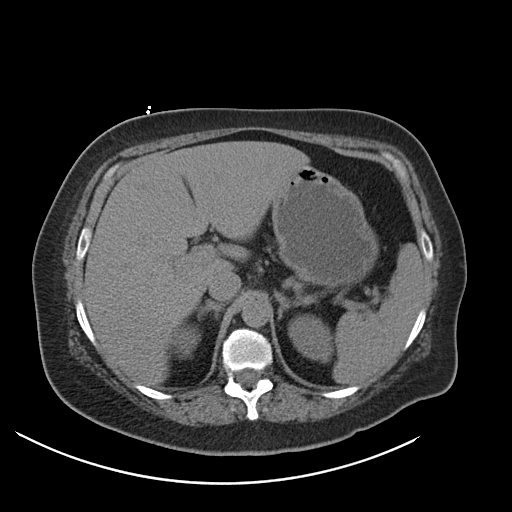
[im 85/96  soft-tissue]
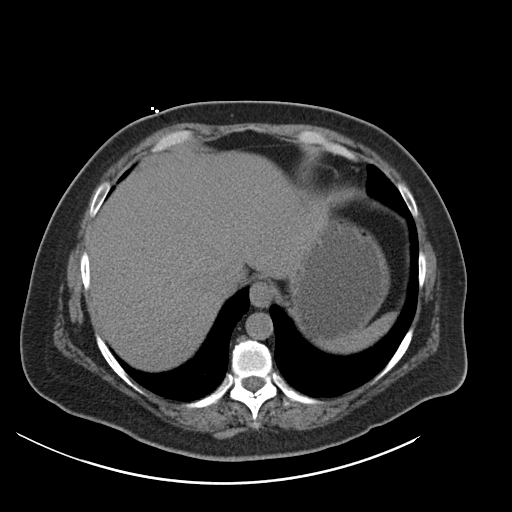
[im 90/96  soft-tissue]
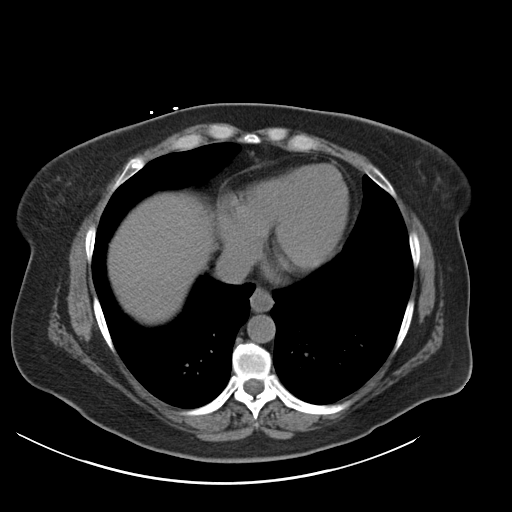

[Series 5: coronal st · coronal · 0.93mm/px · 3 of 131 slices shown]
[im 44/131  soft-tissue]
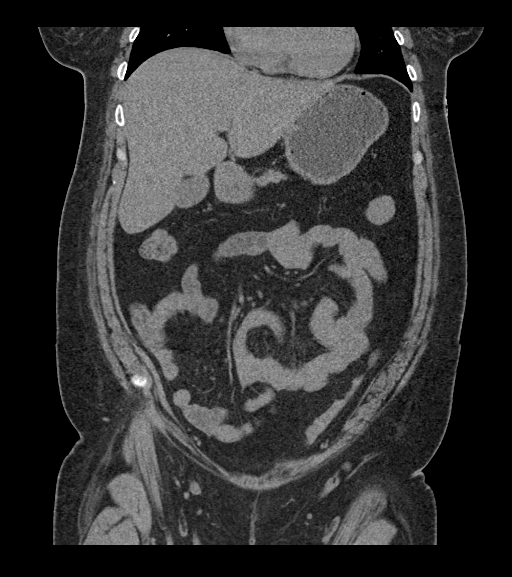
[im 58/131  soft-tissue]
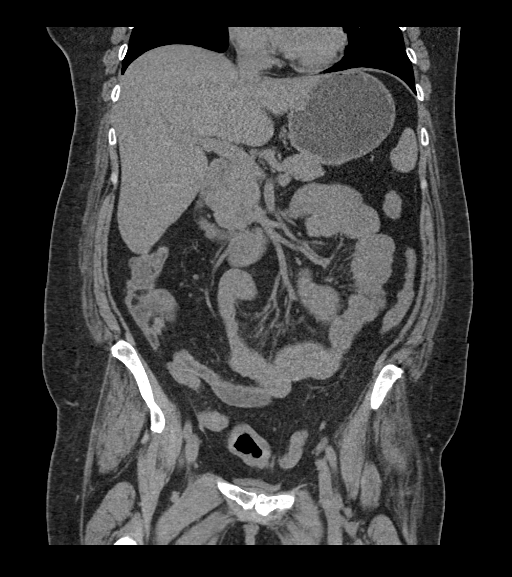
[im 73/131  soft-tissue]
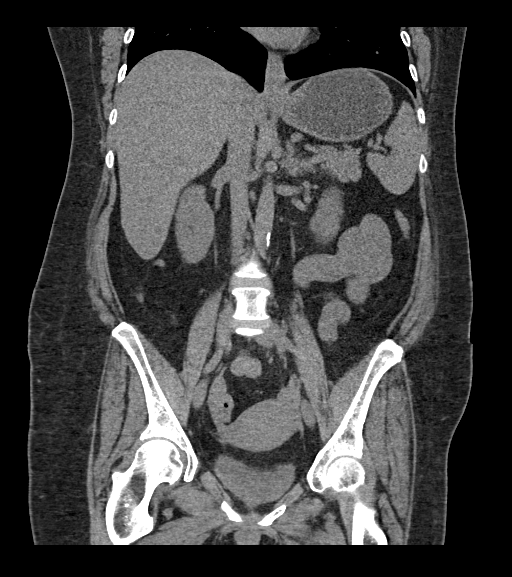

[16 of 46 positions shown; findings below may reference images not displayed]

FINDINGS: Lower chest: The lung bases are clear. The imaged heart is
unremarkable.

Hepatobiliary: The liver and gallbladder are unremarkable. There is
no biliary ductal dilatation.

Pancreas: Unremarkable.

Spleen: Unremarkable.

Adrenals/Urinary Tract: The adrenals are unremarkable.

There is a small calcification in the left interpolar region. The
kidneys are otherwise unremarkable, with no other stone, focal
lesion, hydronephrosis, or hydroureter. The bladder is decompressed
but grossly unremarkable.

Stomach/Bowel: The stomach is unremarkable. There is no evidence of
bowel obstruction. There is no abnormal bowel wall thickening or
inflammatory change. The appendix is normal.

Vascular/Lymphatic: There is mild calcified atherosclerotic plaque
in the nonaneurysmal abdominal aorta. There is no abdominal or
pelvic lymphadenopathy.

Reproductive: The uterus and adnexa are unremarkable.

Other: There is no ascites or free air.

There is a small fat containing ventral abdominal hernia just
superior and to the left of the level of the umbilicus which is
increased in conspicuity since the prior study from 4834. There is
mild stranding within the herniated fat raising suspicion for
strangulation/ischemia.

Musculoskeletal: There is no acute osseous abnormality or aggressive
osseous lesion.
IMPRESSION: 1. Small fat containing ventral abdominal hernia just superior and
to the left of the umbilicus. There is mild stranding in the
herniated fat raising the possibility of strangulation.
2. Otherwise, no acute findings in the abdomen or pelvis.

## 2023-06-26 ENCOUNTER — Ambulatory Visit: Payer: Medicaid Other | Admitting: Internal Medicine

## 2023-06-26 NOTE — Progress Notes (Deleted)
Name: [name]  Age/ Sex: 48 y.o., female   MRN/ DOB: [id number], [birth year]     PCP: Orland Penman, MD   Reason for Endocrinology Evaluation: Type 2 Diabetes Mellitus  Initial Endocrine Consultative Visit: 10/20/2020    PATIENT IDENTIFIER: [name] is a 48 y.o. female with a past medical history of T2DM, Dyslipidemia, Crohn's disease, CAD, CHF  and HTN. The patient has followed with Endocrinology clinic since 10/20/2020 for consultative assistance with management of her diabetes.  DIABETIC HISTORY:  [name] was diagnosed with DM as gestational diabetes at age 28, by age 74 she was started on Metformin . Insulin was started in her 30's. Her hemoglobin A1c has ranged from 7.9% in 2012, peaking at 12.8% in 2022  ON her initial visit to our clinic  she had an A1c of 12.8 % . She had Metformin, lantus and Humalog on her list but had not taken insulin in 2 months by then. We continued Metformin, started [place] and restarted insulin .  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 She checks her blood sugars multiple times daily through CGM.Marland Kitchen The patient has not had hypoglycemic episodes since the last clinic visit. She is symptomatic with these symptoms   She has Crohn's ,  and gastroparesis- is following up with [facility]    This patient with type 2 diabetes is treated with omnipod (insulin pump). During the visit the pump basal and bolus doses were reviewed including carb/insulin rations and supplemental doses. The clinical list was updated. The glucose meter download was  reviewed in detail to determine if the current pump settings are providing the best glycemic control without excessive hypoglycemia.  Pump and meter download:    Pump   OmniPod Settings   Insulin type   Humalog   Basal rate       0000 1.60 u/h    0900 1.55          I:C ratio       0000 1:1 #12 with a meal, #6 with snack                  Sensitivity       0000  25      Goal       0000  120             Type & Model of Pump: OmniPod Insulin Type: Currently using Humalog.  There is no height or weight on file to calculate BMI.  PUMP STATISTICS: Average BG: 233 Average Daily Carbs (g): 16.7 Average Total Daily Insulin: 76.9 Average Daily Basal: 64(83%) Average Daily Bolus: 17 (12.8%)   CONTINUOUS GLUCOSE MONITORING RECORD INTERPRETATION    Dates of Recording:9/21-10/09/2022  Sensor description: Dexcom  Results statistics:   CGM use % of time 82.8%  Average and SD 233/55  Time in range 17  %  % Time Above 180 49  % Time above 250 34  % Time Below target 0   Glycemic patterns summary: BGs are high overnight  Hyperglycemic  episodes postprandial  Hypoglycemic episodes occurred N/A  Overnight periods: Mostly high   HOME DIABETES REGIMEN:  Metformin 500 mg , 1 tablet Twice daily  Farxiga 10 mg, 1 tablet in the morning  Humalog     Statin: yes ACE-I/ARB: yes Prior Diabetic Education: yes     DIABETIC COMPLICATIONS: Microvascular complications:  neuropathy Denies: CKD, retinopathy Last Eye Exam: Completed 10/2020  Macrovascular complications:   Denies: CAD, CVA, PVD   HISTORY:  Past Medical History:  Past Medical History:  Diagnosis Date   Anxiety    Crohn's disease (HCC)    Diabetes mellitus    DKA (diabetic ketoacidosis) (HCC)    Hypertension    Insomnia    Neuropathy    Past Surgical History:  Past Surgical History:  Procedure Laterality Date   COLONOSCOPY     HERNIA REPAIR     RECTAL SURGERY     RIGHT/LEFT HEART CATH  AND CORONARY ANGIOGRAPHY N/A 04/21/2022   Procedure: RIGHT/LEFT HEART CATH AND CORONARY ANGIOGRAPHY;  Surgeon: Orpah Cobb, MD;  Location: [facility];  Service: Cardiovascular;  Laterality: N/A;   Social History:  reports that she has been smoking cigarettes. She has never used smokeless tobacco. She reports current drug use. Drug: Marijuana. She reports that she does not drink alcohol. Family History:  Family History  Problem Relation Age of Onset   Heart disease Father    Crohn's disease Father    Colon cancer Neg Hx    Rectal cancer Neg Hx    Esophageal cancer Neg Hx      HOME MEDICATIONS: Allergies as of 06/26/2023       Reactions   Ceclor [cefaclor] Hives        Medication List        Accurate as of June 26, 2023  7:02 AM. If you have any questions, ask your nurse or doctor.          Accu-Chek Guide Me w/Device Kit 1 Device by Does not apply route in the morning, at noon, in the evening, and at bedtime.   Accu-Chek Guide test strip Generic drug: glucose blood 1 each by Other route in the morning, at noon, in the evening, and at bedtime. Use as instructed   acetaminophen 325 MG tablet Commonly known as: TYLENOL Take 650 mg by mouth every 6 (six) hours as needed for mild pain or moderate pain.   Aspirin Low Dose 81 MG tablet Generic drug: aspirin EC TAKE 1 Tablet BY MOUTH ONCE DAILY. swallow whole. (bedtime)   atorvastatin 80 MG tablet Commonly known as: LIPITOR TAKE 1 Tablet BY MOUTH ONCE DAILY at bedtime (bedtime)   carisoprodol 350 MG tablet Commonly known as: SOMA Soma 350 mg tablet Take 1 tablet 3 times a day by oral route as needed for 7 days.   dapagliflozin propanediol 10 MG Tabs tablet Commonly known as: Farxiga Take 1 tablet (10 mg total) by mouth daily.   Dexcom G6 Sensor Misc USE AS DIRECTED. PLACE ONTO THE SKIN EVERY 10 DAYS   Dexcom G6 Transmitter Misc USE AS DIRECTED with dexcom   Entresto 24-26 MG Generic drug:  sacubitril-valsartan TAKE 1 Tablet BY MOUTH TWICE DAILY (morning, bedtime)   escitalopram 20 MG tablet Commonly known as: LEXAPRO Take 20 mg by mouth daily.   fenofibrate 145 MG tablet Commonly known as: TRICOR Take 1 tablet (145 mg total) by mouth daily.   furosemide 20 MG tablet Commonly known as: LASIX Take 1 tablet (20 mg  total) by mouth daily.   gabapentin 300 MG capsule Commonly known as: NEURONTIN Take 300 mg by mouth 3 (three) times daily.   insulin lispro 100 UNIT/ML KwikPen Commonly known as: HumaLOG KwikPen Max daily 80 units per pump   insulin lispro 100 UNIT/ML injection Commonly known as: HumaLOG Max daily 80 units per pump   Insulin Pen Needle 32G X 4 MM Misc 1 Device by Does not apply route in the morning, at noon, in the evening, and at bedtime.   Lantus SoloStar 100 UNIT/ML Solostar Pen Generic drug: insulin glargine Inject 50 Units into the skin daily.   lidocaine 5 % Commonly known as: Lidoderm Place 1 patch onto the skin daily. Remove & Discard patch within 12 hours or as directed by MD   metFORMIN 500 MG 24 hr tablet Commonly known as: GLUCOPHAGE-XR Take 1 tablet (500 mg total) by mouth 2 (two) times daily with a meal.   metoprolol succinate 25 MG 24 hr tablet Commonly known as: TOPROL-XL take 1/2 TABLET BY MOUTH DAILY (morning)   nitroGLYCERIN 0.4 MG SL tablet Commonly known as: NITROSTAT Place 1 tablet (0.4 mg total) under the tongue every 5 (five) minutes x 3 doses as needed for chest pain.   Omnipod 5 DexG7G6 Intro Gen 5 Kit 1 Device by Does not apply route every other day.   Omnipod 5 DexG7G6 Pods Gen 5 Misc Use as directed EVERY OTHER DAY   oxyCODONE 5 MG immediate release tablet Commonly known as: Oxy IR/ROXICODONE oxycodone 5 mg tablet TAKE 1 TABLET BY MOUTH AT BEDTIME   pantoprazole 40 MG tablet Commonly known as: Protonix Take 1 tablet (40 mg total) by mouth daily.   promethazine 25 MG tablet Commonly known as:  PHENERGAN promethazine 25 mg tablet  Take 1 tablet every 4 hours by oral route as needed.   promethazine 25 MG suppository Commonly known as: PHENERGAN Place rectally.   spironolactone 25 MG tablet Commonly known as: ALDACTONE Take 1 tablet (25 mg total) by mouth daily.   tiZANidine 4 MG tablet Commonly known as: ZANAFLEX tizanidine 4 mg tablet TAKE 1 TABLET BY MOUTH EVERY 8 HOURS   traMADol 50 MG tablet Commonly known as: ULTRAM tramadol 50 mg tablet TAKE 1 TABLET BY MOUTH EVERY 6 HOURS AS NEEDED   zolpidem 10 MG tablet Commonly known as: AMBIEN Take 10 mg by mouth at bedtime as needed for sleep.         OBJECTIVE:   Vital Signs: LMP 08/03/2019 (Approximate)   Wt Readings from Last 3 Encounters:  03/21/23 241 lb (109.3 kg)  03/10/23 238 lb (108 kg)  02/17/23 222 lb 0.1 oz (100.7 kg)     Exam: General: Pt appears well and is in NAD  Lungs: Clear with good BS bilat   Heart: RRR  Extremities: No pretibial edema.   Neuro: MS is good with appropriate affect, pt is alert and Ox3   DM foot exam:09/07/2022   The skin of the feet is  without sores or ulcerations. The pedal pulses are 2+ on right and 2+ on left. The sensation is absent  to a screening 5.07, 10 gram monofilament bilaterally        DATA REVIEWED:  Lab Results  Component Value Date   HGBA1C 12.8 (A) 10/20/2020   HGBA1C (H) 08/05/2010    Latest Reference Range & Units 03/21/23 09:27  Sodium 135 - 145 mmol/L 138  Potassium 3.5 - 5.1 mmol/L 3.7  Chloride 98 - 111 mmol/L  104  CO2 22 - 32 mmol/L 20 (L)  Glucose 70 - 99 mg/dL 657 (H)  BUN 6 - 20 mg/dL 9  Creatinine 8.46 - 9.62 mg/dL 9.52  Calcium 8.9 - 84.1 mg/dL 9.1  Anion gap 5 - 15  14  GFR, Estimated >60 mL/min >60  B Natriuretic Peptide 0.0 - 100.0 pg/mL 23.7  (L): Data is abnormally low (H): Data is abnormally high   ASSESSMENT / PLAN / RECOMMENDATIONS:   1) Type 2 Diabetes Mellitus, Poorly controlled, With neuropathic complications  and microabuminuria - Most recent A1c of 9.7 %. Goal A1c < 7.0 %.    -Patient continues with worsening glycemic control -In reviewing his CGM/pump download, the patient has not been consistently bolusing for meals nor snacks, her BG last night around 9 PM was 150 Mg/DL, but this morning is 324 mg/DL, this is because the patient she ate supper last night without any bolusing, we again discussed the importance of entering 12 g of carbohydrates with meals and 6 g of carbohydrates with meals -She also has entered 12 g this morning at 6:41 AM withOUT eating breakfast, I have discouraged the patient from this practice and we again discussed the importance of entering carbohydrates with meals and she was also shown how to enter her BG's into the pump so she can receive a correction bolus for hyperglycemia, as she was not aware how to do this - Intolerant to higher doses of metformin - I will adjust her sensitivity factor -I have also increased her basal rate overnight -NOT a candidate for GLP-1 agonist due to gastroparesis   MEDICATIONS: Continue Metformin 500 mg , 1 tablet Twice daily  Continue  Farxiga 10  mg, 1 tablet in the morning      Pump   OmniPod Settings   Insulin type   Humalog   Basal rate       0000 1.60u/h    0900 1.55          I:C ratio       0000 1:1 #12 with a meal, #6 with snack                  Sensitivity       0000  20      Goal       0000  120          EDUCATION / INSTRUCTIONS: BG monitoring instructions: Patient is instructed to check her blood sugars 3 times a day, before each meal . Call [facility] if: BG persistently < 70  I reviewed the Rule of 15 for the treatment of hypoglycemia in detail with the patient. Literature supplied.   2) Diabetic complications:  Eye: Does not have known diabetic retinopathy.  Neuro/ Feet: Does have known diabetic peripheral neuropathy .  Renal: Patient does not have known baseline CKD. She   is not  on an ACEI/ARB at present.     3) Dyslipidemia/CAD/CHF :    -Per cardiology    F/U in 3 months     Signed electronically by: Lyndle Herrlich, MD  [facility] End[facility]  [facility] [address] [place], [place] [address] Phone: [phone] FAX: [phone]   CC: Winton Offord, Konrad Dolores, MD [address] [place] [address] Phone: [phone]  Fax: [phone]  Return to Endocrinology clinic as below: Future Appointments  Date Time Provider Department Center  06/26/2023  8:50 AM Reonna Finlayson, Konrad Dolores, MD [id number]  None

## 2023-07-05 ENCOUNTER — Ambulatory Visit: Payer: Medicaid Other | Admitting: Internal Medicine

## 2023-07-05 ENCOUNTER — Encounter: Payer: Self-pay | Admitting: Internal Medicine

## 2023-07-05 VITALS — BP 120/78 | HR 88 | Wt 241.0 lb

## 2023-07-05 DIAGNOSIS — Z794 Long term (current) use of insulin: Secondary | ICD-10-CM | POA: Diagnosis not present

## 2023-07-05 DIAGNOSIS — R809 Proteinuria, unspecified: Secondary | ICD-10-CM

## 2023-07-05 DIAGNOSIS — E1129 Type 2 diabetes mellitus with other diabetic kidney complication: Secondary | ICD-10-CM | POA: Diagnosis not present

## 2023-07-05 DIAGNOSIS — E1165 Type 2 diabetes mellitus with hyperglycemia: Secondary | ICD-10-CM | POA: Diagnosis not present

## 2023-07-05 DIAGNOSIS — E1142 Type 2 diabetes mellitus with diabetic polyneuropathy: Secondary | ICD-10-CM

## 2023-07-05 DIAGNOSIS — E785 Hyperlipidemia, unspecified: Secondary | ICD-10-CM | POA: Diagnosis not present

## 2023-07-05 LAB — BASIC METABOLIC PANEL
BUN: 14 mg/dL (ref 7–25)
CO2: 24 mmol/L (ref 20–32)
Calcium: 9.4 mg/dL (ref 8.6–10.2)
Chloride: 104 mmol/L (ref 98–110)
Creat: 0.76 mg/dL (ref 0.50–0.99)
Glucose, Bld: 258 mg/dL — ABNORMAL HIGH (ref 65–99)
Potassium: 3.8 mmol/L (ref 3.5–5.3)
Sodium: 140 mmol/L (ref 135–146)

## 2023-07-05 LAB — POCT GLYCOSYLATED HEMOGLOBIN (HGB A1C): Hemoglobin A1C: 10 % — AB (ref 4.0–5.6)

## 2023-07-05 LAB — MICROALBUMIN / CREATININE URINE RATIO
Creatinine, Urine: 35 mg/dL (ref 20–275)
Microalb Creat Ratio: 46 mg/g{creat} — ABNORMAL HIGH (ref <30)
Microalb, Ur: 1.6 mg/dL

## 2023-07-05 LAB — TSH: TSH: 1.02 m[IU]/L

## 2023-07-05 LAB — LIPID PANEL
Cholesterol: 187 mg/dL (ref <200)
HDL: 32 mg/dL — ABNORMAL LOW (ref >=50)
Non-HDL Cholesterol (Calc): 155 mg/dL — ABNORMAL HIGH (ref <130)
Total CHOL/HDL Ratio: 5.8 (calc) — ABNORMAL HIGH (ref <5.0)
Triglycerides: 422 mg/dL — ABNORMAL HIGH (ref <150)

## 2023-07-05 LAB — T4, FREE: Free T4: 1.1 ng/dL (ref 0.8–1.8)

## 2023-07-05 MED ORDER — OMNIPOD 5 DEXG7G6 PODS GEN 5 MISC: 1.0000 | 3 refills | Status: DC

## 2023-07-05 MED ORDER — DAPAGLIFLOZIN PROPANEDIOL 10 MG PO TABS: 10.0000 mg | ORAL_TABLET | Freq: Every day | ORAL | 3 refills | Status: DC

## 2023-07-05 MED ORDER — DEXCOM G7 SENSOR MISC: 1.0000 | 3 refills | Status: DC

## 2023-07-05 MED ORDER — INSULIN LISPRO 100 UNIT/ML IJ SOLN: INTRAMUSCULAR | 11 refills | Status: DC

## 2023-07-05 MED ORDER — METFORMIN HCL ER 500 MG PO TB24: 500.0000 mg | ORAL_TABLET | Freq: Two times a day (BID) | ORAL | 3 refills | Status: DC
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
## 2023-07-05 NOTE — Patient Instructions (Signed)
Enter 14 grams with a meal and 7 grams with a snack    HOW TO TREAT LOW BLOOD SUGARS (Blood sugar LESS THAN 70 MG/DL) Please follow the RULE OF 15 for the treatment of hypoglycemia treatment (when your (blood sugars are less than 70 mg/dL)   STEP 1: Take 15 grams of carbohydrates when your blood sugar is low, which includes:  3-4 GLUCOSE TABS  OR 3-4 OZ OF JUICE OR REGULAR SODA OR ONE TUBE OF GLUCOSE GEL    STEP 2: RECHECK blood sugar in 15 MINUTES STEP 3: If your blood sugar is still low at the 15 minute recheck --> then, go back to STEP 1 and treat AGAIN with another 15 grams of carbohydrates.

## 2023-07-06 ENCOUNTER — Encounter: Payer: Self-pay | Admitting: Internal Medicine

## 2023-07-07 ENCOUNTER — Telehealth: Payer: Self-pay

## 2023-07-07 ENCOUNTER — Telehealth: Payer: Self-pay | Admitting: Internal Medicine

## 2023-07-07 ENCOUNTER — Other Ambulatory Visit (HOSPITAL_COMMUNITY): Payer: Self-pay

## 2023-07-07 DIAGNOSIS — R809 Proteinuria, unspecified: Secondary | ICD-10-CM | POA: Insufficient documentation

## 2023-07-07 MED ORDER — FENOFIBRATE 200 MG PO CAPS: 200.0000 mg | ORAL_CAPSULE | Freq: Every day | ORAL | 3 refills | Status: DC

## 2023-07-07 NOTE — Telephone Encounter (Signed)
Please let the patient know that her urine shows she is leaking extra protein in the urine, this is a sign that the diabetes is starting to cause kidney damage, it is reversible at this point but she must improve her glucose control.   Also, her triglycerides are very high, she does need to work on low-fat diet, I have increased her fenofibrate from 145 Mg to 200 Mg daily  This is in addition to continuing atorvastatin 80 mg daily  Her thyroid is normal  I would recommend that she sees our diabetic educator, as her diet is poor and she needs to improve this.  She said she could not afford to go to the weight management clinic, so maybe seeing the dietitian will be a better option for her

## 2023-07-07 NOTE — Telephone Encounter (Signed)
Pharmacy Patient Advocate Encounter   Received notification from Pt Calls Messages that prior authorization for [name] is required/requested.   Insurance verification completed.   The patient is insured through [facility] .   Per test claim: PA required; PA submitted to above mentioned insurance via CoverMyMeds Key/confirmation #/EOC [id number] Status is pending

## 2023-07-07 NOTE — Telephone Encounter (Signed)
Fenofibrate needs PA per My Pharmacy.

## 2023-07-07 NOTE — Telephone Encounter (Signed)
Patient has been advised and will make medication changes. Patient would like the referral to the educator.

## 2023-07-07 NOTE — Addendum Note (Signed)
Addended by: Scarlette Shorts on: 07/07/2023 08:42 AM   Modules accepted: Orders

## 2023-07-07 NOTE — Telephone Encounter (Signed)
Pharmacy Patient Advocate Encounter   Received notification from Pt Calls Messages that prior authorization for Fenofibrate 200 mg is required/requested.   Insurance verification completed.   The patient is insured through [facility] .   Per test claim: PA required; PA submitted to above mentioned insurance via CoverMyMeds Key/confirmation #/EOC [facility] Status is pending

## 2023-07-24 NOTE — Telephone Encounter (Signed)
 Pharmacy Patient Advocate Encounter  Received notification from [facility] that Prior Authorization for Fenofibrate has been DENIED.  See denial reason below. No denial letter attached in CMM. Will attach denial letter to Media tab once received.    PA #/Case ID/Reference #: [id number]

## 2023-07-25 MED ORDER — ICOSAPENT ETHYL 1 G PO CAPS: 2.0000 g | ORAL_CAPSULE | Freq: Two times a day (BID) | ORAL | 3 refills | Status: DC

## 2023-07-25 NOTE — Addendum Note (Signed)
 Addended by: Scarlette Shorts on: 07/25/2023 03:27 PM   Modules accepted: Orders

## 2023-07-26 NOTE — Telephone Encounter (Signed)
LDTVM and sent my chart message.

## 2023-08-01 ENCOUNTER — Telehealth: Payer: Self-pay | Admitting: Nutrition

## 2023-08-01 NOTE — Telephone Encounter (Signed)
 I called and left her a message on how to do this.

## 2023-08-01 NOTE — Telephone Encounter (Signed)
 LVM on how to change the pdm to be able to use G7 sensors.  Also gave omnipod help line if she has difficulty, or if her PDM is not updated to be able to use the G7 sensors Also reminded her that her box of pods must say for use with G7 sensors for the pod to be able to pick up there readings from the sensor

## 2023-08-02 ENCOUNTER — Ambulatory Visit: Payer: Medicaid Other | Admitting: Nutrition

## 2023-08-07 ENCOUNTER — Other Ambulatory Visit: Payer: Self-pay

## 2023-08-07 ENCOUNTER — Telehealth: Payer: Self-pay

## 2023-08-07 NOTE — Telephone Encounter (Signed)
 Pharmacy requesting Fluconazole, would you like to refill again?

## 2023-08-21 ENCOUNTER — Other Ambulatory Visit (HOSPITAL_COMMUNITY): Payer: Self-pay | Admitting: Cardiology

## 2023-08-24 ENCOUNTER — Other Ambulatory Visit (HOSPITAL_COMMUNITY): Payer: Self-pay

## 2023-09-07 ENCOUNTER — Ambulatory Visit: Admitting: Nurse Practitioner

## 2023-09-19 ENCOUNTER — Encounter: Payer: Self-pay | Admitting: Internal Medicine

## 2023-09-21 ENCOUNTER — Telehealth: Payer: Self-pay

## 2023-09-21 ENCOUNTER — Other Ambulatory Visit (HOSPITAL_COMMUNITY): Payer: Self-pay

## 2023-09-21 NOTE — Telephone Encounter (Signed)
 Dexcom needs PA and also need update from [place] PA in January

## 2023-09-21 NOTE — Telephone Encounter (Signed)
 Pharmacy Patient Advocate Encounter   Received notification from Pt Calls Messages that prior authorization for Dexcom G7 sensor is required/requested.   Insurance verification completed.   The patient is insured through [facility] .   Per test claim: PA required; PA submitted to above mentioned insurance via CoverMyMeds Key/confirmation #/EOC [id number] Status is pending

## 2023-09-21 NOTE — Telephone Encounter (Signed)
 Pharmacy Patient Advocate Encounter   Received notification from Pt Calls Messages that prior authorization for Farxiga is required/requested.   Insurance verification completed.   The patient is insured through [facility] .   Per test claim: PA required; PA submitted to above mentioned insurance via CoverMyMeds Key/confirmation #/EOC [facility] Status is pending

## 2023-10-05 ENCOUNTER — Encounter (INDEPENDENT_AMBULATORY_CARE_PROVIDER_SITE_OTHER): Payer: Self-pay | Admitting: Otolaryngology

## 2023-10-12 NOTE — Telephone Encounter (Signed)
 Pharmacy Patient Advocate Encounter  Received notification from [employer] that Prior Authorization for Dexcom G7 Sensor has been APPROVED from 09-21-2023 to 09-20-2024   PA #/Case ID/Reference #: [id number]

## 2023-10-23 ENCOUNTER — Telehealth (HOSPITAL_COMMUNITY): Payer: Self-pay | Admitting: Cardiology

## 2023-10-24 ENCOUNTER — Telehealth: Payer: Self-pay

## 2023-10-24 ENCOUNTER — Other Ambulatory Visit (HOSPITAL_COMMUNITY): Payer: Self-pay

## 2023-10-24 NOTE — Telephone Encounter (Signed)
 Pharmacy Patient Advocate Encounter   Received notification from CoverMyMeds that prior authorization for Omnipod is required/requested.   Insurance verification completed.   The patient is insured through [facility] .   Per test claim: PA required; PA submitted to above mentioned insurance via CoverMyMeds Key/confirmation #/EOC [id number] Status is pending

## 2023-10-25 ENCOUNTER — Telehealth (HOSPITAL_COMMUNITY): Payer: Self-pay | Admitting: Cardiology

## 2023-10-25 NOTE — Telephone Encounter (Signed)
 Called to confirm/remind patient of their appointment at the [facility] on 10/24/23.   Appointment:   [x] Confirmed  [] Left mess   [] No answer/No voice mail  [] VM Full/unable to leave message  [] Phone not in service  Patient reminded to bring all medications and/or complete list.  Confirmed patient has transportation. Gave directions, instructed to utilize valet parking.

## 2023-10-26 ENCOUNTER — Ambulatory Visit (HOSPITAL_COMMUNITY)
Admission: RE | Admit: 2023-10-26 | Discharge: 2023-10-26 | Disposition: A | Discharge status: Home / Self Care | Source: Ambulatory Visit | Attending: Cardiology | Admitting: Cardiology

## 2023-10-26 VITALS — BP 116/80 | HR 106 | Wt 238.6 lb

## 2023-10-26 DIAGNOSIS — Z6841 Body Mass Index (BMI) 40.0 and over, adult: Secondary | ICD-10-CM | POA: Diagnosis not present

## 2023-10-26 DIAGNOSIS — Z7985 Long-term (current) use of injectable non-insulin antidiabetic drugs: Secondary | ICD-10-CM | POA: Diagnosis not present

## 2023-10-26 DIAGNOSIS — K5090 Crohn's disease, unspecified, without complications: Secondary | ICD-10-CM | POA: Diagnosis not present

## 2023-10-26 DIAGNOSIS — I10 Essential (primary) hypertension: Secondary | ICD-10-CM | POA: Diagnosis not present

## 2023-10-26 DIAGNOSIS — K638219 Small intestinal bacterial overgrowth, unspecified: Secondary | ICD-10-CM | POA: Diagnosis not present

## 2023-10-26 DIAGNOSIS — E1121 Type 2 diabetes mellitus with diabetic nephropathy: Secondary | ICD-10-CM

## 2023-10-26 DIAGNOSIS — I428 Other cardiomyopathies: Secondary | ICD-10-CM | POA: Diagnosis not present

## 2023-10-26 DIAGNOSIS — Z7984 Long term (current) use of oral hypoglycemic drugs: Secondary | ICD-10-CM | POA: Insufficient documentation

## 2023-10-26 DIAGNOSIS — I110 Hypertensive heart disease with heart failure: Secondary | ICD-10-CM | POA: Diagnosis not present

## 2023-10-26 DIAGNOSIS — Z794 Long term (current) use of insulin: Secondary | ICD-10-CM | POA: Insufficient documentation

## 2023-10-26 DIAGNOSIS — F17210 Nicotine dependence, cigarettes, uncomplicated: Secondary | ICD-10-CM | POA: Diagnosis not present

## 2023-10-26 DIAGNOSIS — K50919 Crohn's disease, unspecified, with unspecified complications: Secondary | ICD-10-CM

## 2023-10-26 DIAGNOSIS — I5022 Chronic systolic (congestive) heart failure: Secondary | ICD-10-CM | POA: Diagnosis present

## 2023-10-26 DIAGNOSIS — Z8611 Personal history of tuberculosis: Secondary | ICD-10-CM | POA: Diagnosis not present

## 2023-10-26 DIAGNOSIS — E1140 Type 2 diabetes mellitus with diabetic neuropathy, unspecified: Secondary | ICD-10-CM | POA: Insufficient documentation

## 2023-10-26 DIAGNOSIS — E669 Obesity, unspecified: Secondary | ICD-10-CM | POA: Diagnosis not present

## 2023-10-26 LAB — BASIC METABOLIC PANEL WITH GFR
Anion gap: 8 (ref 5–15)
BUN: 7 mg/dL (ref 6–20)
CO2: 23 mmol/L (ref 22–32)
Calcium: 9.1 mg/dL (ref 8.9–10.3)
Chloride: 106 mmol/L (ref 98–111)
Creatinine, Ser: 0.59 mg/dL (ref 0.44–1.00)
GFR, Estimated: >60 mL/min (ref >60)
Glucose, Bld: 219 mg/dL — ABNORMAL HIGH (ref 70–99)
Potassium: 3.9 mmol/L (ref 3.5–5.1)
Sodium: 137 mmol/L (ref 135–145)

## 2023-10-26 LAB — BRAIN NATRIURETIC PEPTIDE: B Natriuretic Peptide: 10.2 pg/mL (ref 0.0–100.0)

## 2023-10-26 LAB — HEMOGLOBIN A1C
Hgb A1c MFr Bld: 8.6 % — ABNORMAL HIGH (ref 4.8–5.6)
Mean Plasma Glucose: 200.12 mg/dL

## 2023-10-26 MED ORDER — METOPROLOL SUCCINATE ER 25 MG PO TB24: 25.0000 mg | ORAL_TABLET | Freq: Every day | ORAL | 3 refills | Status: DC

## 2023-10-26 NOTE — Patient Instructions (Signed)
 Medication Changes:  INCREASE Metoprolol  succinate to 25 mg (1 tab) Daily   Lab Work:  Labs done today, your results will be available in MyChart, we will contact you for abnormal readings.  Special Instructions // Education:  Do the following things EVERYDAY: Weigh yourself in the morning before breakfast. Write it down and keep it in a log. Take your medicines as prescribed Eat low salt foods--Limit salt (sodium) to 2000 mg per day.  Stay as active as you can everyday Limit all fluids for the day to less than 2 liters   Follow-Up in: as needed   At the [facility], you and your health needs are our priority. We have a designated team specialized in the treatment of Heart Failure. This Care Team includes your primary Heart Failure Specialized Cardiologist (physician), Advanced Practice Providers (APPs- Physician Assistants and Nurse Practitioners), and Pharmacist who all work together to provide you with the care you need, when you need it.   You may see any of the following providers on your designated Care Team at your next follow up:  Dr. Jules Oar Dr. Peder Bourdon Dr. Alwin Baars Dr. Judyth Nunnery Nieves Bars, NP Ruddy Corral, [place] [facility] [place], [place] Dennise Fitz, NP [place] Lee, NP Luster Salters, PharmD   Please be sure to bring in all your medications bottles to every appointment.   Need to Contact Us :  If you have any questions or concerns before your next appointment please send us  a message through [place] or call our office at [phone].    TO LEAVE A MESSAGE FOR THE NURSE SELECT OPTION 2, PLEASE LEAVE A MESSAGE INCLUDING: YOUR NAME DATE OF BIRTH CALL BACK NUMBER REASON FOR CALL**this is important as we prioritize the call backs  YOU WILL RECEIVE A CALL BACK THE SAME DAY AS LONG AS YOU CALL BEFORE 4:00 PM

## 2023-10-26 NOTE — Progress Notes (Signed)
 ADVANCED HEART FAILURE CLINIC NOTE  Referring Physician: No ref. provider found  Primary Care: No primary care provider on file. Primary Cardiologist: Dr. Bruce Caper  HPI: [name] is a 48 y.o. female with T2DM, Crohn's disease, hx of TB, tobacco use that presented to [facility] in 11/23 with N/V, elevated hs-troponin and TTE w/ newly reduced LVEF (25%); coronary angiography during admission w/o obstructive CAD and RHC w/ preserved CI. She was diuresed, started on low dose GDMT and discharged home. Shortly after starting GDMT, she had recovery of LVEF by TTE on 07/05/22.   Interval hx:  She reports feeling very fatigued although this has been chronic due to her underlying Crohns. She has also been struggling with weight gain over the past several months; reports >40lb weight gain. She believe some of this is due to volume overload and has started taking lasix  80mg  1-2 days weekly. No significant dyspnea, PND or orthopnea during this time.   Activity level/exercise tolerance:  NYHA III but limited predominantly by underlying inflammatory disease and pain. Orthopnea:  Sleeps on 2 pillows Paroxysmal noctural dyspnea:  No Chest pain/pressure:  yes Orthostatic lightheadedness:  no Palpitations:  no Lower extremity edema:  no Presyncope/syncope:  no Cough:  no  Past Medical History:  Diagnosis Date   Anxiety    Crohn's disease (HCC)    Diabetes mellitus    DKA (diabetic ketoacidosis) (HCC)    Hypertension    Insomnia    Neuropathy     Current Outpatient Medications  Medication Sig Dispense Refill   ACCU-CHEK GUIDE test strip 1 each by Other route in the morning, at noon, in the evening, and at bedtime. Use as instructed 400 each 3   acetaminophen  (TYLENOL ) 325 MG tablet Take 650 mg by mouth every 6 (six) hours as needed for mild pain or moderate pain.     ASPIRIN  LOW DOSE 81 MG tablet TAKE 1 Tablet BY MOUTH ONCE DAILY. swallow whole. (bedtime) 30 tablet 12   atorvastatin   (LIPITOR ) 80 MG tablet TAKE 1 Tablet BY MOUTH ONCE DAILY at bedtime (bedtime) 90 tablet 3   Blood Glucose Monitoring Suppl (ACCU-CHEK GUIDE ME) w/Device KIT 1 Device by Does not apply route in the morning, at noon, in the evening, and at bedtime. 1 kit 0   carisoprodol (SOMA) 350 MG tablet Soma 350 mg tablet Take 1 tablet 3 times a day by oral route as needed for 7 days.     Continuous Glucose Sensor (DEXCOM G7 SENSOR) MISC 1 Device by Does not apply route as directed. 9 each 3   Continuous Glucose Transmitter (DEXCOM G6 TRANSMITTER) MISC USE AS DIRECTED with dexcom 1 each 3   dapagliflozin  propanediol (FARXIGA ) 10 MG TABS tablet Take 1 tablet (10 mg total) by mouth daily. 90 tablet 3   dicyclomine (BENTYL) 10 MG capsule Take 10 mg by mouth 3 (three) times daily as needed.     ENTRESTO  24-26 MG TAKE 1 Tablet BY MOUTH TWICE DAILY (morning, bedtime) 60 tablet 11   escitalopram  (LEXAPRO ) 20 MG tablet Take 20 mg by mouth daily.     fluconazole  (DIFLUCAN ) 150 MG tablet Take 150 mg by mouth every 3 (three) days. As needed     furosemide  (LASIX ) 20 MG tablet TAKE 1 Tablet BY MOUTH ONCE DAILY (BOTTLE) 30 tablet 5   gabapentin  (NEURONTIN ) 300 MG capsule Take 300 mg by mouth 3 (three) times daily.     Insulin  Disposable Pump (OMNIPOD 5 DEXG7G6 PODS GEN 5)  MISC 1 Device by Other route every other day. 45 each 3   insulin  lispro (HUMALOG  KWIKPEN) 100 UNIT/ML KwikPen Max daily 80 units per pump 80 mL 2   insulin  lispro (HUMALOG ) 100 UNIT/ML injection Max daily 100 units per pump 100 mL 11   Insulin  Pen Needle 32G X 4 MM MISC 1 Device by Does not apply route in the morning, at noon, in the evening, and at bedtime. 400 each 1   lidocaine  (LIDODERM ) 5 % Place 1 patch onto the skin daily. Remove & Discard patch within 12 hours or as directed by MD 30 patch 0   metFORMIN  (GLUCOPHAGE -XR) 500 MG 24 hr tablet Take 1 tablet (500 mg total) by mouth 2 (two) times daily with a meal. 180 tablet 3   metoprolol  succinate  (TOPROL -XL) 25 MG 24 hr tablet take 1/2 TABLET BY MOUTH DAILY (morning) 45 tablet 3   nitroGLYCERIN  (NITROSTAT ) 0.4 MG SL tablet Place 1 tablet (0.4 mg total) under the tongue every 5 (five) minutes x 3 doses as needed for chest pain. 25 tablet 3   nystatin (MYCOSTATIN/NYSTOP) powder Apply topically 4 (four) times daily.     pantoprazole  (PROTONIX ) 40 MG tablet Take 1 tablet (40 mg total) by mouth daily. (Patient taking differently: Take 40 mg by mouth 2 (two) times daily.) 30 tablet 1   promethazine  (PHENERGAN ) 25 MG tablet promethazine  25 mg tablet  Take 1 tablet every 4 hours by oral route as needed.     spironolactone  (ALDACTONE ) 25 MG tablet TAKE 1 Tablet BY MOUTH ONCE DAILY (MORNING) 30 tablet 5   tiZANidine  (ZANAFLEX ) 4 MG tablet tizanidine  4 mg tablet TAKE 1 TABLET BY MOUTH EVERY 8 HOURS     XIFAXAN 550 MG TABS tablet Take 550 mg by mouth 3 (three) times daily.     zolpidem  (AMBIEN ) 10 MG tablet Take 10 mg by mouth at bedtime as needed for sleep.     icosapent  Ethyl (VASCEPA ) 1 g capsule Take 2 capsules (2 g total) by mouth 2 (two) times daily. 360 capsule 3   No current facility-administered medications for this encounter.    Allergies  Allergen Reactions   Ceclor [Cefaclor] Hives      Social History   Socioeconomic History   Marital status: Divorced    Spouse name: Not on file   Number of children: 6   Years of education: Not on file   Highest education level: Not on file  Occupational History   Occupation: Disable  Tobacco Use   Smoking status: Every Day    Current packs/day: 0.50    Types: Cigarettes   Smokeless tobacco: Never  Vaping Use   Vaping status: Former  Substance and Sexual Activity   Alcohol use: No   Drug use: Yes    Types: Marijuana   Sexual activity: Not on file  Other Topics Concern   Not on file  Social History Narrative   ** Merged History Encounter **       Social Drivers of Health   Financial Resource Strain: Not on file  Food  Insecurity: No Food Insecurity (04/21/2022)   Hunger Vital Sign    Worried About Running Out of Food in the Last Year: Never true    Ran Out of Food in the Last Year: Never true  Transportation Needs: No Transportation Needs (04/21/2022)   PRAPARE - Administrator, Civil Service (Medical): No    Lack of Transportation (Non-Medical): No  Physical Activity: Not  on file  Stress: Not on file  Social Connections: Unknown (10/19/2021)   Received from [facility]   Social Network    Social Network: Not on file  Intimate Partner Violence: Not At Risk (04/21/2022)   Humiliation, Afraid, Rape, and Kick questionnaire    Fear of Current or Ex-Partner: No    Emotionally Abused: No    Physically Abused: No    Sexually Abused: No      Family History  Problem Relation Age of Onset   Heart disease Father    Crohn's disease Father    Colon cancer Neg Hx    Rectal cancer Neg Hx    Esophageal cancer Neg Hx     PHYSICAL EXAM: Vitals:   10/26/23 1500  BP: 116/80  Pulse: (!) 106  SpO2: 94%   GENERAL: Well nourished, well developed, and in no apparent distress at rest.  HEENT: Negative for arcus senilis or xanthelasma. There is no scleral icterus.  The mucous membranes are pink and moist.   NECK: Supple, No masses. Normal carotid upstrokes without bruits. No masses or thyromegaly.    CHEST: There are no chest wall deformities. There is no chest wall tenderness. Respirations are unlabored.  Lungs- CTA B/L CARDIAC:  JVP: difficult to assess, does not appear severely elevated          Normal rate with regular rhythm. No murmurs, rubs or gallops.  Pulses are 2+ and symmetrical in upper and lower extremities. No edema.  ABDOMEN: Soft, non-tender, non-distended. There are no masses or hepatomegaly. There are normal bowel sounds.  EXTREMITIES: Warm and well perfused with no cyanosis, clubbing.  LYMPHATIC: No axillary or supraclavicular lymphadenopathy.  NEUROLOGIC:  Patient is oriented x3 with no focal or lateralizing neurologic deficits.  PSYCH: Patients affect is appropriate, there is no evidence of anxiety or depression.  SKIN: Warm and dry; no lesions or wounds.   DATA REVIEW  ECG: 05/02/22: NSR  ECHO: 04/19/22: LVEF 25%-30%, G2DD, normal RV function 06/14/2022: Bedside ultrasound today in clinic with normal LV function and preserved RV function. 07/05/22: normal LV function  CATH: RHC/LHC, 04/21/22:    Prox LAD lesion is 25% stenosed.   Mid LAD lesion is 30% stenosed.   Prox RCA lesion is 50% stenosed.   There is severe left ventricular systolic dysfunction.   LV end diastolic pressure is normal.   The left ventricular ejection fraction is less than 25% by visual estimate.  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 No significant FH of heart failure. Recovery of LVEF on TTE from 07/05/22.  NYHA class / AHA Stage:III, I believe her underlying gastroparesis and Crohn's/inflammatory disease is a large component of her functional limitations. Volume status & Diuretics:  Euvolemic on exam; taking lasix  20mg  daily.  Vasodilators : Continue Entresto  24/26 mg twice daily Beta-Blocker: Increase toprol  to 25mg  daily.  MRA: increase to 25mg  daily; repeat BMP/BNP today.  Cardiometabolic:farxiga  10mg  daily Devices therapies & Valvulopathies:not indicated Advanced therapies: Not indicated.   2. Crohn's Disease / SIBO - continues to struggle with fatigue   3. Hx of TB - Reports that she was briefly treated, however, could not complete treatment due to rise in LFTs and significant difficulty with associated symptoms?  - Reportedly no longer a candidate for biologics for Crohn's management due to underlying hx of untreated latent TB.  4. Obesity  - Body mass  index is 40.96 kg/m. - Now on ozempic  5. Hypertension - BP at goal today;  repeat BMP/BNP, continue Entresto   6. T2DM - Now on insulin  pump - A1C of 10 on 07/05/23 - Repeat A1C today  I spent 41 minutes caring for this patient today including face to face time, ordering and reviewing labs, reviewing records from recent ER visit, seeing the patient, documenting in the record, and arranging follow ups.   Vicke Plotner Advanced Heart Failure Mechanical Circulatory Support

## 2023-10-26 NOTE — Telephone Encounter (Signed)
 Pharmacy Patient Advocate Encounter  Received notification from [employer] that Prior Authorization for Omnipod has been CANCELLED due to  the requested product does not require prior authorization   PA #/Case ID/Reference #: [id number]

## 2023-11-01 ENCOUNTER — Ambulatory Visit: Payer: Medicaid Other | Admitting: Internal Medicine

## 2023-11-09 ENCOUNTER — Ambulatory Visit: Admitting: Nurse Practitioner

## 2023-12-12 ENCOUNTER — Encounter: Payer: Self-pay | Admitting: Internal Medicine

## 2023-12-12 ENCOUNTER — Ambulatory Visit (INDEPENDENT_AMBULATORY_CARE_PROVIDER_SITE_OTHER): Admitting: Internal Medicine

## 2023-12-12 VITALS — BP 120/82 | HR 97 | Ht 64.0 in | Wt 243.0 lb

## 2023-12-12 DIAGNOSIS — E1142 Type 2 diabetes mellitus with diabetic polyneuropathy: Secondary | ICD-10-CM | POA: Diagnosis not present

## 2023-12-12 DIAGNOSIS — E1129 Type 2 diabetes mellitus with other diabetic kidney complication: Secondary | ICD-10-CM

## 2023-12-12 DIAGNOSIS — Z794 Long term (current) use of insulin: Secondary | ICD-10-CM | POA: Diagnosis not present

## 2023-12-12 DIAGNOSIS — E1165 Type 2 diabetes mellitus with hyperglycemia: Secondary | ICD-10-CM

## 2023-12-12 DIAGNOSIS — R809 Proteinuria, unspecified: Secondary | ICD-10-CM

## 2023-12-12 MED ORDER — DEXCOM G7 SENSOR MISC: 1.0000 | 3 refills | Status: DC

## 2023-12-12 MED ORDER — DAPAGLIFLOZIN PROPANEDIOL 10 MG PO TABS: 10.0000 mg | ORAL_TABLET | Freq: Every day | ORAL | 3 refills | Status: DC

## 2023-12-12 MED ORDER — OMNIPOD 5 DEXG7G6 PODS GEN 5 MISC: 1.0000 | 3 refills | Status: DC

## 2023-12-12 MED ORDER — METFORMIN HCL ER 500 MG PO TB24: 500.0000 mg | ORAL_TABLET | Freq: Two times a day (BID) | ORAL | 3 refills | Status: DC

## 2023-12-12 NOTE — Progress Notes (Signed)
 Name: [name]  Age/ Sex: 48 y.o., female   MRN/ DOB: [id number], November 16, 1975     PCP: Patient, No Pcp Per   Reason for Endocrinology Evaluation: Type 2 Diabetes Mellitus  Initial Endocrine Consultative Visit: 10/20/2020    PATIENT IDENTIFIER: [name] is a 48 y.o. female with a past medical history of T2DM, Dyslipidemia, Crohn's disease, CAD, CHF  and HTN. The patient has followed with Endocrinology clinic since 10/20/2020 for consultative assistance with management of her diabetes.  DIABETIC HISTORY:  [name] was diagnosed with DM as gestational diabetes at age 71, by age 17 she was started on Metformin  . Insulin  was started in her 74's. Her hemoglobin A1c has ranged from 7.9% in 2012, peaking at 12.8% in 2022  ON her initial visit to our clinic  she had an A1c of 12.8 % . She had Metformin , lantus  and Humalog  on her list but had not taken insulin  in 2 months by then. We continued Metformin , started Farxiga  and restarted insulin  .  She has hx of pyelonephritis as well as candidiasis  She is on disability for Crohn's  disease , unable to tolerate biological due to previous TB exposure    She has stable diarrhea due to Crohn's disease , PCP started her on Ozempic 12/2021    Was started on the OmniPod 06/13/2022  SUBJECTIVE:   During the last visit (07/05/2023): A1c 10.0%    Today (12/12/2023): [name] is here for a follow up on diabetes management. She checks her blood sugars multiple times daily through CGM.SABRA The patient has not had hypoglycemic episodes since the last clinic visit. She is symptomatic with these symptoms    She was evaluated by general surgery for umbilical hernia 11/2023  She has Crohn's , and gastroparesis- is following up with [facility] She also has been on Abx for intestinal over growth  Continues with nausea  Continues with  alternating constipation with diarrhea    This patient with type 2 diabetes is treated with omnipod (insulin   pump). During the visit the pump basal and bolus doses were reviewed including carb/insulin  rations and supplemental doses. The clinical list was updated. The glucose meter download was reviewed in detail to determine if the current pump settings are providing the best glycemic control without excessive hypoglycemia.  Pump and meter download:      Pump   OmniPod Settings   Insulin  type   Humalog    Basal rate       0000 1.65 u/h    0900 1.55          I:C ratio       0000 1:1 #14 with a meal, #7 with snack                  Sensitivity       0000  15      Goal       0000  120            Type & Model of Pump: OmniPod Insulin  Type: Currently using Humalog .  Body mass index is 41.71 kg/m.  PUMP STATISTICS: Average BG:224 Average Daily Carbs (g):15.6 Average Total Daily Insulin : 74.6 Average Daily Basal: 59.1(79%) Average Daily Bolus: 15.5(21%)   CONTINUOUS GLUCOSE MONITORING RECORD INTERPRETATION    Dates of Recording:6/25-12/12/2023  Sensor description: Dexcom  Results statistics:   CGM use % of time 91.6%  Average and SD 224/47  Time in range 19%  % Time Above 180  51  % Time above 250 30  % Time Below target 0   Glycemic patterns summary: BGs are high throughout the day and night  Hyperglycemic episodes all day and night  Hypoglycemic episodes occurred N/A  Overnight periods: High   HOME DIABETES REGIMEN:  Metformin  500 mg , 1 tablet Twice daily  Farxiga  10 mg, 1 tablet in the morning  Humalog      Statin: yes ACE-I/ARB: yes Prior Diabetic Education: yes     DIABETIC COMPLICATIONS: Microvascular complications:  neuropathy Denies: CKD, retinopathy Last Eye Exam: Completed 10/2020  Macrovascular complications:   Denies: CAD, CVA, PVD   HISTORY:  Past Medical History:  Past Medical History:  Diagnosis Date   Anxiety    Crohn's disease (HCC)    Diabetes mellitus    DKA (diabetic ketoacidosis) (HCC)    Hypertension     Insomnia    Neuropathy    Past Surgical History:  Past Surgical History:  Procedure Laterality Date   COLONOSCOPY     HERNIA REPAIR     RECTAL SURGERY     RIGHT/LEFT HEART CATH AND CORONARY ANGIOGRAPHY N/A 04/21/2022   Procedure: RIGHT/LEFT HEART CATH AND CORONARY ANGIOGRAPHY;  Surgeon: Claudene Pacific, MD;  Location: [facility];  Service: Cardiovascular;  Laterality: N/A;   Social History:  reports that she has been smoking cigarettes. She has never used smokeless tobacco. She reports current drug use. Drug: Marijuana. She reports that she does not drink alcohol. Family History:  Family History  Problem Relation Age of Onset   Heart disease Father    Crohn's disease Father    Colon cancer Neg Hx    Rectal cancer Neg Hx    Esophageal cancer Neg Hx      HOME MEDICATIONS: Allergies as of 12/12/2023       Reactions   Ceclor [cefaclor] Hives        Medication List        Accurate as of December 12, 2023  1:29 PM. If you have any questions, ask your nurse or doctor.          Accu-Chek Guide Me w/Device Kit 1 Device by Does not apply route in the morning, at noon, in the evening, and at bedtime.   Accu-Chek Guide test strip Generic drug: glucose blood 1 each by Other route in the morning, at noon, in the evening, and at bedtime. Use as instructed   acetaminophen  325 MG tablet Commonly known as: TYLENOL  Take 650 mg by mouth every 6 (six) hours as needed for mild pain or moderate pain.   Aspirin  Low Dose 81 MG tablet Generic drug: aspirin  EC TAKE 1 Tablet BY MOUTH ONCE DAILY. swallow whole. (bedtime)   atorvastatin  80 MG tablet Commonly known as: LIPITOR  TAKE 1 Tablet BY MOUTH ONCE DAILY at bedtime (bedtime)   carisoprodol 350 MG tablet Commonly known as: SOMA Soma 350 mg tablet Take 1 tablet 3 times a day by oral route as needed for 7 days.   dapagliflozin  propanediol 10 MG Tabs tablet Commonly known as: Farxiga  Take 1 tablet (10 mg total) by mouth daily.    Dexcom G6 Transmitter Misc USE AS DIRECTED with dexcom   Dexcom G7 Sensor Misc 1 Device by Does not apply route as directed.   dicyclomine 10 MG capsule Commonly known as: BENTYL Take 10 mg by mouth 3 (three) times daily as needed.   Entresto  24-26 MG Generic drug: sacubitril-valsartan TAKE 1 Tablet BY MOUTH TWICE DAILY (morning, bedtime)  escitalopram  20 MG tablet Commonly known as: LEXAPRO  Take 20 mg by mouth daily.   fluconazole  150 MG tablet Commonly known as: DIFLUCAN  Take 150 mg by mouth every 3 (three) days. As needed   furosemide  20 MG tablet Commonly known as: LASIX  TAKE 1 Tablet BY MOUTH ONCE DAILY (BOTTLE)   gabapentin  300 MG capsule Commonly known as: NEURONTIN  Take 300 mg by mouth 3 (three) times daily.   icosapent  Ethyl 1 g capsule Commonly known as: Vascepa  Take 2 capsules (2 g total) by mouth 2 (two) times daily.   insulin  lispro 100 UNIT/ML KwikPen Commonly known as: HumaLOG  KwikPen Max daily 80 units per pump   insulin  lispro 100 UNIT/ML injection Commonly known as: HumaLOG  Max daily 100 units per pump   Insulin  Pen Needle 32G X 4 MM Misc 1 Device by Does not apply route in the morning, at noon, in the evening, and at bedtime.   lidocaine  5 % Commonly known as: Lidoderm  Place 1 patch onto the skin daily. Remove & Discard patch within 12 hours or as directed by MD   metFORMIN  500 MG 24 hr tablet Commonly known as: GLUCOPHAGE -XR Take 1 tablet (500 mg total) by mouth 2 (two) times daily with a meal.   metoprolol  succinate 25 MG 24 hr tablet Commonly known as: TOPROL -XL Take 1 tablet (25 mg total) by mouth daily.   nitroGLYCERIN  0.4 MG SL tablet Commonly known as: NITROSTAT  Place 1 tablet (0.4 mg total) under the tongue every 5 (five) minutes x 3 doses as needed for chest pain.   nystatin powder Commonly known as: MYCOSTATIN/NYSTOP Apply topically 4 (four) times daily.   Omnipod 5 DexG7G6 Pods Gen 5 Misc 1 Device by Other route every  other day.   pantoprazole  40 MG tablet Commonly known as: Protonix  Take 1 tablet (40 mg total) by mouth daily. What changed: when to take this   promethazine  25 MG tablet Commonly known as: PHENERGAN  promethazine  25 mg tablet  Take 1 tablet every 4 hours by oral route as needed.   spironolactone  25 MG tablet Commonly known as: ALDACTONE  TAKE 1 Tablet BY MOUTH ONCE DAILY (MORNING)   tiZANidine  4 MG tablet Commonly known as: ZANAFLEX  tizanidine  4 mg tablet TAKE 1 TABLET BY MOUTH EVERY 8 HOURS   Xifaxan 550 MG Tabs tablet Generic drug: rifaximin Take 550 mg by mouth 3 (three) times daily.   zolpidem  10 MG tablet Commonly known as: AMBIEN  Take 10 mg by mouth at bedtime as needed for sleep.         OBJECTIVE:   Vital Signs: BP 120/82 (BP Location: Left Arm, Patient Position: Sitting, Cuff Size: Normal)   Pulse 97   Ht 5' 4 (1.626 m)   Wt 243 lb (110.2 kg)   LMP 08/03/2019 (Approximate)   SpO2 95%   BMI 41.71 kg/m   Wt Readings from Last 3 Encounters:  12/12/23 243 lb (110.2 kg)  10/26/23 238 lb 9.6 oz (108.2 kg)  07/05/23 241 lb (109.3 kg)     Exam: General: Pt appears well and is in NAD  Lungs: Clear with good BS bilat   Heart: RRR  Extremities: No pretibial edema.   Neuro: MS is good with appropriate affect, pt is alert and Ox3   DM foot exam:12/12/2023   The skin of the feet is  without sores or ulcerations. The pedal pulses are 2+ on right and 2+ on left. The sensation is absent  to a screening 5.07, 10 gram monofilament bilaterally  DATA REVIEWED:  Lab Results  Component Value Date   HGBA1C 12.8 (A) 10/20/2020   HGBA1C (H) 08/05/2010    Latest Reference Range & Units 07/05/23 08:50  Total CHOL/HDL Ratio <5.0 (calc) 5.8 (H)  Cholesterol <200 mg/dL 812  HDL Cholesterol > OR = 50 mg/dL 32 (L)  LDL Cholesterol (Calc) mg/dL (calc) Pend  MICROALB/CREAT RATIO <30 mg/g creat 46 (H)  Non-HDL Cholesterol (Calc) <130 mg/dL (calc) 844 (H)   Triglycerides <150 mg/dL 577 (H)    Latest Reference Range & Units 07/05/23 08:50  TSH mIU/L 1.02  T4,Free(Direct) 0.8 - 1.8 ng/dL 1.1    Latest Reference Range & Units 07/05/23 08:50  Microalb, Ur mg/dL 1.6  MICROALB/CREAT RATIO <30 mg/g creat 46 (H)  Creatinine, Urine 20 - 275 mg/dL 35  (H): Data is abnormally high   Latest Reference Range & Units 07/05/23 08:50  Sodium 135 - 146 mmol/L 140  Potassium 3.5 - 5.3 mmol/L 3.8  Chloride 98 - 110 mmol/L 104  CO2 20 - 32 mmol/L 24  Glucose 65 - 99 mg/dL 741 (H)  BUN 7 - 25 mg/dL 14  Creatinine 9.49 - 9.00 mg/dL 9.23  Calcium  8.6 - 10.2 mg/dL 9.4  BUN/Creatinine Ratio 6 - 22 (calc) SEE NOTE:  (H): Data is abnormally high   ASSESSMENT / PLAN / RECOMMENDATIONS:   1) Type 2 Diabetes Mellitus, Poorly controlled, With neuropathic complications and microabuminuria - Most recent A1c of 8.6 %. Goal A1c < 7.0 %.    -Patient continues with hyperglycemia, I did explain to the patient the importance of optimizing glucose control prior to her hernia surgery which is scheduled next month.  A1c goal <7.5% to decrease risk of infections and complications - Intolerant to higher doses of metformin  -She does have gastroparesis, and would avoid GLP-1 agonist at this time - Will increase basal rate as below as well as sensitivity factor - Patient has not been bolusing on a regular basis, we discussed the importance of bolusing with meals/snacks  MEDICATIONS: Continue Metformin  500 mg , 1 tablet Twice daily  Continue  Farxiga  10  mg, 1 tablet in the morning      Pump   OmniPod Settings   Insulin  type   Humalog    Basal rate       0000 1.70 u/h    0900 1.60          I:C ratio       0000 1:1 #14 with a meal, #7 with snack                  Sensitivity       0000  10      Goal       0000  120          EDUCATION / INSTRUCTIONS: BG monitoring instructions: Patient is instructed to check her blood sugars 3 times a day, before each  meal . Call [facility] if: BG persistently < 70  I reviewed the Rule of 15 for the treatment of hypoglycemia in detail with the patient. Literature supplied.   2) Diabetic complications:  Eye: Does not have known diabetic retinopathy.  Neuro/ Feet: Does have known diabetic peripheral neuropathy .  Renal: Patient does not have known baseline CKD. She   is not on an ACEI/ARB at present.     3) Dyslipidemia/CAD/CHF :    -Per cardiology   4) Microalbuminuria :  -MA/CR ratio elevated -Patient on Entresto  -Will encourage optimizing  glucose control   F/U in 4 months     Signed electronically by: Stefano Redgie Butts, MD  [facility] Endocr[facility]  [facility] [address] [place] [place], [place] [address] Phone: [phone] FAX: [phone]   CC: Patient, No Pcp Per No address on file Phone: None  Fax: None  Return to Endocrinology clinic as below: Future Appointments  Date Time Provider Department Center  12/21/2023  9:00 AM Billy Knee, FNP [id number]

## 2023-12-12 NOTE — Patient Instructions (Signed)
 Enter 14 grams with a meal and 7 grams with a snack    HOW TO TREAT LOW BLOOD SUGARS (Blood sugar LESS THAN 70 MG/DL) Please follow the RULE OF 15 for the treatment of hypoglycemia treatment (when your (blood sugars are less than 70 mg/dL)   STEP 1: Take 15 grams of carbohydrates when your blood sugar is low, which includes:  3-4 GLUCOSE TABS  OR 3-4 OZ OF JUICE OR REGULAR SODA OR ONE TUBE OF GLUCOSE GEL    STEP 2: RECHECK blood sugar in 15 MINUTES STEP 3: If your blood sugar is still low at the 15 minute recheck --> then, go back to STEP 1 and treat AGAIN with another 15 grams of carbohydrates.

## 2023-12-21 ENCOUNTER — Ambulatory Visit: Admitting: Internal Medicine

## 2023-12-21 ENCOUNTER — Encounter: Payer: Self-pay | Admitting: Internal Medicine

## 2023-12-21 VITALS — BP 116/78 | HR 100 | Temp 98.1°F | Ht 64.0 in | Wt 236.2 lb

## 2023-12-21 DIAGNOSIS — Z6841 Body Mass Index (BMI) 40.0 and over, adult: Secondary | ICD-10-CM

## 2023-12-21 DIAGNOSIS — F32A Depression, unspecified: Secondary | ICD-10-CM

## 2023-12-21 DIAGNOSIS — F419 Anxiety disorder, unspecified: Secondary | ICD-10-CM

## 2023-12-21 DIAGNOSIS — Z124 Encounter for screening for malignant neoplasm of cervix: Secondary | ICD-10-CM

## 2023-12-21 DIAGNOSIS — K638219 Small intestinal bacterial overgrowth, unspecified: Secondary | ICD-10-CM

## 2023-12-21 DIAGNOSIS — G629 Polyneuropathy, unspecified: Secondary | ICD-10-CM

## 2023-12-21 DIAGNOSIS — E785 Hyperlipidemia, unspecified: Secondary | ICD-10-CM

## 2023-12-21 DIAGNOSIS — Z794 Long term (current) use of insulin: Secondary | ICD-10-CM | POA: Diagnosis not present

## 2023-12-21 DIAGNOSIS — B370 Candidal stomatitis: Secondary | ICD-10-CM

## 2023-12-21 DIAGNOSIS — I10 Essential (primary) hypertension: Secondary | ICD-10-CM

## 2023-12-21 DIAGNOSIS — Z1231 Encounter for screening mammogram for malignant neoplasm of breast: Secondary | ICD-10-CM

## 2023-12-21 DIAGNOSIS — L409 Psoriasis, unspecified: Secondary | ICD-10-CM

## 2023-12-21 DIAGNOSIS — B372 Candidiasis of skin and nail: Secondary | ICD-10-CM

## 2023-12-21 DIAGNOSIS — Z711 Person with feared health complaint in whom no diagnosis is made: Secondary | ICD-10-CM

## 2023-12-21 DIAGNOSIS — E1169 Type 2 diabetes mellitus with other specified complication: Secondary | ICD-10-CM

## 2023-12-21 DIAGNOSIS — I5022 Chronic systolic (congestive) heart failure: Secondary | ICD-10-CM | POA: Insufficient documentation

## 2023-12-21 DIAGNOSIS — E66813 Obesity, class 3: Secondary | ICD-10-CM

## 2023-12-21 MED ORDER — PANTOPRAZOLE SODIUM 40 MG PO TBEC: 40.0000 mg | DELAYED_RELEASE_TABLET | Freq: Two times a day (BID) | ORAL | Status: AC

## 2023-12-21 MED ORDER — FLUCONAZOLE 150 MG PO TABS: ORAL_TABLET | ORAL | 0 refills | Status: DC

## 2023-12-21 MED ORDER — NYSTATIN 100000 UNIT/GM EX POWD: Freq: Four times a day (QID) | CUTANEOUS | 3 refills | Status: DC

## 2023-12-21 MED ORDER — NYSTATIN 100000 UNIT/ML MT SUSP
5.0000 mL | Freq: Four times a day (QID) | OROMUCOSAL | 0 refills | Status: DC
Start: 2023-12-21 — End: 2024-04-04

## 2023-12-21 NOTE — Progress Notes (Signed)
 [facility] PRIMARY CARE [facility] [address] [place] [address] Dept: [phone] Dept Fax: [phone]  New Patient Office Visit  Subjective:   [name] [birth year] 12/21/2023  Chief Complaint  Patient presents with   Establish Care    Wants referrals to specialist     HPI: [name] presents today to establish care at [facility]. Introduced to Publishing rights manager role and practice setting.  All questions answered. She has been seen at a [place] office within past 3 years.  Concerns: See below   Discussed the use of AI scribe software for clinical note transcription with the patient, who gave verbal consent to proceed.  History of Present Illness   [name] is a 48 year old female with a history of type 2 diabetes with neuropathy, dyslipidemia, anxiety and depression, hypertension, Crohn's disease, congestive heart failure, and SIBO who presents to establish primary care.  Patient reports her type 2 diabetes is managed by endocrinology.  Per endocrinology notes patient is to continue taking metformin  500 mg twice daily, and continue insulin  pump.  Patient reports that she had discussed taking Ozempic for weight loss and diabetes control with her endocrinologist and the gastroenterologist.  Both specialties did not agree for patient to take this medication given her Crohn's disease and gastroparesis.  Patient states that she went to an [facility] urgent care facility, who started prescribing the medication for the patient.   She is experiencing a recurrence of small intestinal bacterial overgrowth (SIBO) symptoms after completing an eight-week course of antibiotics, which initially improved her condition. Symptoms returned last week, causing significant discomfort. She is unable to see her gastroenterologist until October due to a missed appointment.  She reports severe psoriasis affecting her scalp  and possibly other areas, including under her breasts and in the groin, where she suspects yeast. She has tried various treatments, including Diflucan  and over-the-counter creams, without relief. She describes the condition as 'miserable' and 'itchy,' with cracking skin.  She also reports thrush on her tongue.  Her congestive heart failure NYHA IIb, blood pressure, and dyslipidemia are managed by cardiology.  Per cardiology notes patient is on Entresto  for CHF and HTN management.  She is also on Farxiga  and atorvastatin  80 mg.  She is experiencing memory issues, including getting lost while driving, which has occurred three times. She is concerned about the potential side effects of gabapentin , which she takes for neuropathy, and wonders if it might be contributing to her memory problems. She also feels overwhelmed and depressed, questioning the effectiveness of her current antidepressant, escitalopram .She has a complex social situation, caring for a seven-year-old autistic son and multiple grandchildren, while also dealing with family stressors, including living with a daughter with whom she does not get along.      Patient reports she is on multiple different medications.  She states they come to her in a pill pack so she does not know what all medication she is currently taking.  She receives her medications through my pharmacy in [place].  PCP office is requesting list of medications to be sent for review to ensure accurate list on chart.  The following portions of the patient's history were reviewed and updated as appropriate: past medical history, past surgical history, family history, social history, allergies, medications, and problem list.   Patient Active Problem List   Diagnosis Date Noted   Microalbuminuria 07/07/2023   NSTEMI (non-ST elevated myocardial infarction) (HCC) 04/20/2022  Anxiety 04/19/2022   Depressive disorder 04/19/2022   Chronic back pain 04/19/2022    Gastrointestinal Crohn's disease (HCC) 04/19/2022   Neuropathy 04/19/2022   Acute coronary syndrome (HCC) 04/19/2022   Intractable nausea and vomiting 02/22/2021   Type 2 diabetes mellitus (HCC) 10/20/2020   Dyslipidemia 10/20/2020   Hyperlipidemia 07/17/2020   Hypertensive disorder 07/17/2020   Past Medical History:  Diagnosis Date   Anxiety    Crohn's disease (HCC)    Diabetes mellitus    DKA (diabetic ketoacidosis) (HCC)    Hypertension    Insomnia    Neuropathy    Past Surgical History:  Procedure Laterality Date   COLONOSCOPY     HERNIA REPAIR     RECTAL SURGERY     RIGHT/LEFT HEART CATH AND CORONARY ANGIOGRAPHY N/A 04/21/2022   Procedure: RIGHT/LEFT HEART CATH AND CORONARY ANGIOGRAPHY;  Surgeon: Claudene Pacific, MD;  Location: [facility] INVASIVE CV [facility];  Service: Cardiovascular;  Laterality: N/A;   Family History  Problem Relation Age of Onset   Heart disease Father    Crohn's disease Father    Colon cancer Neg Hx    Rectal cancer Neg Hx    Esophageal cancer Neg Hx     Current Outpatient Medications:    ACCU-CHEK GUIDE test strip, 1 each by Other route in the morning, at noon, in the evening, and at bedtime. Use as instructed, Disp: 400 each, Rfl: 3   acetaminophen  (TYLENOL ) 325 MG tablet, Take 650 mg by mouth every 6 (six) hours as needed for mild pain or moderate pain., Disp: , Rfl:    ASPIRIN  LOW DOSE 81 MG tablet, TAKE 1 Tablet BY MOUTH ONCE DAILY. swallow whole. (bedtime), Disp: 30 tablet, Rfl: 12   atorvastatin  (LIPITOR ) 80 MG tablet, TAKE 1 Tablet BY MOUTH ONCE DAILY at bedtime (bedtime), Disp: 90 tablet, Rfl: 3   Blood Glucose Monitoring Suppl (ACCU-CHEK GUIDE ME) w/Device KIT, 1 Device by Does not apply route in the morning, at noon, in the evening, and at bedtime., Disp: 1 kit, Rfl: 0   fluconazole  (DIFLUCAN ) 150 MG tablet, Take 1 tablet by mouth once. Repeat dose in 3 days if symptoms persist., Disp: 2 tablet, Rfl: 0   nystatin  (MYCOSTATIN ) 100000 UNIT/ML  suspension, Take 5 mLs (500,000 Units total) by mouth 4 (four) times daily., Disp: 60 mL, Rfl: 0   carisoprodol (SOMA) 350 MG tablet, Soma 350 mg tablet Take 1 tablet 3 times a day by oral route as needed for 7 days., Disp: , Rfl:    Continuous Glucose Sensor (DEXCOM G7 SENSOR) MISC, 1 Device by Does not apply route as directed., Disp: 9 each, Rfl: 3   dapagliflozin  propanediol (FARXIGA ) 10 MG TABS tablet, Take 1 tablet (10 mg total) by mouth daily., Disp: 90 tablet, Rfl: 3   dicyclomine (BENTYL) 10 MG capsule, Take 10 mg by mouth 3 (three) times daily as needed., Disp: , Rfl:    ENTRESTO  24-26 MG, TAKE 1 Tablet BY MOUTH TWICE DAILY (morning, bedtime), Disp: 60 tablet, Rfl: 11   escitalopram  (LEXAPRO ) 20 MG tablet, Take 20 mg by mouth daily., Disp: , Rfl:    furosemide  (LASIX ) 20 MG tablet, TAKE 1 Tablet BY MOUTH ONCE DAILY (BOTTLE), Disp: 30 tablet, Rfl: 5   gabapentin  (NEURONTIN ) 300 MG capsule, Take 300 mg by mouth 3 (three) times daily., Disp: , Rfl:    icosapent  Ethyl (VASCEPA ) 1 g capsule, Take 2 capsules (2 g total) by mouth 2 (two) times daily., Disp: 360 capsule, Rfl:  3   Insulin  Disposable Pump (OMNIPOD 5 DEXG7G6 PODS GEN 5) MISC, 1 Device by Other route every other day., Disp: 45 each, Rfl: 3   insulin  lispro (HUMALOG  KWIKPEN) 100 UNIT/ML KwikPen, Max daily 80 units per pump, Disp: 80 mL, Rfl: 2   insulin  lispro (HUMALOG ) 100 UNIT/ML injection, Max daily 100 units per pump, Disp: 100 mL, Rfl: 11   Insulin  Pen Needle 32G X 4 MM MISC, 1 Device by Does not apply route in the morning, at noon, in the evening, and at bedtime., Disp: 400 each, Rfl: 1   lidocaine  (LIDODERM ) 5 %, Place 1 patch onto the skin daily. Remove & Discard patch within 12 hours or as directed by MD, Disp: 30 patch, Rfl: 0   metFORMIN  (GLUCOPHAGE -XR) 500 MG 24 hr tablet, Take 1 tablet (500 mg total) by mouth 2 (two) times daily with a meal., Disp: 180 tablet, Rfl: 3   metoprolol  succinate (TOPROL -XL) 25 MG 24 hr tablet,  Take 1 tablet (25 mg total) by mouth daily., Disp: 90 tablet, Rfl: 3   nitroGLYCERIN  (NITROSTAT ) 0.4 MG SL tablet, Place 1 tablet (0.4 mg total) under the tongue every 5 (five) minutes x 3 doses as needed for chest pain., Disp: 25 tablet, Rfl: 3   nystatin  (MYCOSTATIN /NYSTOP ) powder, Apply topically 4 (four) times daily., Disp: 60 g, Rfl: 3   pantoprazole  (PROTONIX ) 40 MG tablet, Take 1 tablet (40 mg total) by mouth 2 (two) times daily., Disp: , Rfl:    promethazine  (PHENERGAN ) 25 MG tablet, promethazine  25 mg tablet  Take 1 tablet every 4 hours by oral route as needed., Disp: , Rfl:    spironolactone  (ALDACTONE ) 25 MG tablet, TAKE 1 Tablet BY MOUTH ONCE DAILY (MORNING), Disp: 30 tablet, Rfl: 5   tiZANidine  (ZANAFLEX ) 4 MG tablet, tizanidine  4 mg tablet TAKE 1 TABLET BY MOUTH EVERY 8 HOURS, Disp: , Rfl:    XIFAXAN 550 MG TABS tablet, Take 550 mg by mouth 3 (three) times daily., Disp: , Rfl:    zolpidem  (AMBIEN ) 10 MG tablet, Take 10 mg by mouth at bedtime as needed for sleep., Disp: , Rfl:  Allergies  Allergen Reactions   Ceclor [Cefaclor] Hives    ROS: A complete ROS was performed with pertinent positives/negatives noted in the HPI. The remainder of the ROS are negative.   Objective:   Today's Vitals   12/21/23 0857  BP: 116/78  Pulse: 100  Temp: 98.1 F (36.7 C)  TempSrc: Temporal  SpO2: 99%  Weight: 236 lb 3.2 oz (107.1 kg)  Height: 5' 4 (1.626 m)    GENERAL: Well-appearing, in NAD. Well nourished.  SKIN: Pink, warm and dry. No rash, lesion, ulceration, or ecchymoses.  NECK: Trachea midline. Full ROM w/o pain or tenderness. No lymphadenopathy.  RESPIRATORY: Chest wall symmetrical. Respirations even and non-labored. Breath sounds clear to auscultation bilaterally.  CARDIAC: S1, S2 present, regular rate and rhythm. Peripheral pulses 2+ bilaterally.  MSK: Muscle tone and strength appropriate for age. Joints w/o tenderness, redness, or swelling.  EXTREMITIES: Without clubbing,  cyanosis, or edema.  NEUROLOGIC: No motor or sensory deficits. Steady, even gait.  PSYCH/MENTAL STATUS: Alert, oriented x 3. Cooperative, appropriate mood and affect.   Health Maintenance Due  Topic Date Due   Hepatitis C Screening  Never done   DTaP/Tdap/Td (1 - Tdap) Never done   Hepatitis B Vaccines (1 of 3 - 19+ 3-dose series) Never done    No results found for any visits on 12/21/23.  Assessment &  Plan:  Assessment and Plan    Small Intestinal Bacterial Overgrowth (SIBO) - Continue dietary management to avoid known triggers. - Follow-up with GI specialist.  Obesity -Patient reports she is taking once weekly injections of Ozempic as prescribed by an urgent care facility.  This has not been recommended by her GI and endocrinology specialist.  I agree with specialty teams due to patient's gastroparesis and Crohn's disease, Ozempic is not the best course of action for weight loss.  - Advised against rapid weight loss and emphasized sustainable dietary changes. - Discuss weight management strategies focusing on sustainable dietary changes.  Type 2 Diabetes Mellitus - Continue management with endocrinology  Psoriasis - Refer to dermatology for further evaluation and management.  Intertrigo and Thrush  Yeast infection under breasts and groin with ineffective previous treatments. Significant discomfort and itching. - Prescribe nystatin  solution for thrush. - Advise on hygiene to keep affected areas dry. - Prescribe nystatin  powder   Anxiety and Depression Feeling overwhelmed and depressed. Interested in counseling but difficulty finding Medicaid-accepting providers. - Provide list of counseling resources accepting Medicaid - Consider adjustment of antidepressant therapy if necessary.  Awaiting current medication list from pharmacy to verify medications.  Congestive Heart Failure  - Continue management with cardiology   Hypertension  - Well controlled currently    Dyslipidemia  - Continue statin therapy  - continue f/u with cardiology   Peripheral Neuropathy Significant neuropathy in feet, concerned about gabapentin  side effects including memory issues. - Review current medications for potential side effects.  Memory Issues Episodes of forgetfulness, possible medication side effect or related to underlying conditions. - Review current medications for potential side effects. - Consider referral to neuropsychiatrist if memory issues persist.  General Health Maintenance Requires routine health screenings and specialist referrals. - Order mammogram and schedule with mobile unit. - Refer to gynecology for routine Pap smear. - Refer to ophthalmology for annual retinal exam.  Follow-up Requires follow-up to review medication list and assess ongoing health issues. - Follow up in 3 months. - Obtain and review complete list of current medications. - Schedule follow-up appointments with specialists as needed.       Orders Placed This Encounter  Procedures   MM 3D SCREENING MAMMOGRAM BILATERAL BREAST    Baseline? INS: Medicaid    Standing Status:   Future    Expiration Date:   12/20/2024    Reason for Exam (SYMPTOM  OR DIAGNOSIS REQUIRED):   screening for breast cancer    Preferred imaging location?:   [facility]    Is the patient pregnant?:   No   Ambulatory referral to Dermatology    Referral Priority:   Routine    Referral Type:   Consultation    Referral Reason:   Specialty Services Required    Requested Specialty:   Dermatology    Number of Visits Requested:   1   Ambulatory referral to Ophthalmology    Referral Priority:   Routine    Referral Type:   Consultation    Referral Reason:   Specialty Services Required    Requested Specialty:   Ophthalmology    Number of Visits Requested:   1   Ambulatory referral to Gynecology    Referral Priority:   Routine    Referral Type:   Consultation    Referral Reason:   Specialty  Services Required    Requested Specialty:   Gynecology    Number of Visits Requested:   1   Meds ordered  this encounter  Medications   pantoprazole  (PROTONIX ) 40 MG tablet    Sig: Take 1 tablet (40 mg total) by mouth 2 (two) times daily.    Supervising Provider:   THOMPSON, AARON B [[id number]]   nystatin  (MYCOSTATIN /NYSTOP ) powder    Sig: Apply topically 4 (four) times daily.    Dispense:  60 g    Refill:  3    Supervising Provider:   THOMPSON, AARON B [[id number]]   nystatin  (MYCOSTATIN ) 100000 UNIT/ML suspension    Sig: Take 5 mLs (500,000 Units total) by mouth 4 (four) times daily.    Dispense:  60 mL    Refill:  0    Supervising Provider:   THOMPSON, AARON B [[id number]]   fluconazole  (DIFLUCAN ) 150 MG tablet    Sig: Take 1 tablet by mouth once. Repeat dose in 3 days if symptoms persist.    Dispense:  2 tablet    Refill:  0    Supervising Provider:   SEBASTIAN BEVERLEY NOVAK [[id number]]    Return in about 3 months (around 03/22/2024) for Chronic Condition follow up.   Rosina Senters, FNP

## 2023-12-21 NOTE — Patient Instructions (Signed)
[facility] in [place], [place] [address] Urgent Care Services (ages 48 yo and up, available 24/7) Outpatient Counseling & Psychiatry (accepts people with no insurance, available during business hours)  [place] Medicaid  (* = Spanish available;  + = Psychiatric services) * [facility] in [place]:                                     [phone] or [phone] Virtual & Onsite  [facility]:                                              [place]:                                         [phone] Virtual & Onsite  * [facility]:                                                   [phone]   [facility]                                                    [phone] Virtual & Onsite  [facility]           [phone] Virtual   [facility]:                                                           [facility]:                                      [place]:                            [facility]                                                [facility] [place]:             [phone] or [phone]   *  [facility]                                                 [phone] Virtual & Onsite    Website to Find a Therapist:       [email or web address]   Substance Use [place]:                                [phone]  Alcoholics Anonymous:      [phone]  Narcotics Anonymous:       [phone]  Quit Smoking Hotline:         800-QUIT-NOW (800-784-8669)    

## 2024-01-08 DIAGNOSIS — F1290 Cannabis use, unspecified, uncomplicated: Secondary | ICD-10-CM | POA: Insufficient documentation

## 2024-02-14 ENCOUNTER — Other Ambulatory Visit (HOSPITAL_COMMUNITY): Payer: Self-pay | Admitting: Cardiology

## 2024-02-19 ENCOUNTER — Inpatient Hospital Stay: Admission: RE | Admit: 2024-02-19 | Source: Ambulatory Visit

## 2024-03-05 ENCOUNTER — Encounter: Payer: Self-pay | Admitting: Obstetrics

## 2024-03-05 ENCOUNTER — Ambulatory Visit (INDEPENDENT_AMBULATORY_CARE_PROVIDER_SITE_OTHER): Admitting: Obstetrics

## 2024-03-05 ENCOUNTER — Other Ambulatory Visit (HOSPITAL_COMMUNITY)
Admission: RE | Admit: 2024-03-05 | Discharge: 2024-03-05 | Disposition: A | Discharge status: Home / Self Care | Source: Ambulatory Visit | Attending: Obstetrics | Admitting: Obstetrics

## 2024-03-05 VITALS — BP 119/76 | HR 100 | Ht 64.0 in | Wt 243.0 lb

## 2024-03-05 DIAGNOSIS — Z1239 Encounter for other screening for malignant neoplasm of breast: Secondary | ICD-10-CM

## 2024-03-05 DIAGNOSIS — E2839 Other primary ovarian failure: Secondary | ICD-10-CM

## 2024-03-05 DIAGNOSIS — K5090 Crohn's disease, unspecified, without complications: Secondary | ICD-10-CM

## 2024-03-05 DIAGNOSIS — Z01419 Encounter for gynecological examination (general) (routine) without abnormal findings: Secondary | ICD-10-CM | POA: Insufficient documentation

## 2024-03-05 DIAGNOSIS — Z794 Long term (current) use of insulin: Secondary | ICD-10-CM

## 2024-03-05 DIAGNOSIS — E1100 Type 2 diabetes mellitus with hyperosmolarity without nonketotic hyperglycemic-hyperosmolar coma (NKHHC): Secondary | ICD-10-CM

## 2024-03-05 DIAGNOSIS — Z6841 Body Mass Index (BMI) 40.0 and over, adult: Secondary | ICD-10-CM

## 2024-03-05 DIAGNOSIS — I5022 Chronic systolic (congestive) heart failure: Secondary | ICD-10-CM | POA: Diagnosis not present

## 2024-03-05 DIAGNOSIS — I10 Essential (primary) hypertension: Secondary | ICD-10-CM

## 2024-03-05 DIAGNOSIS — E66813 Obesity, class 3: Secondary | ICD-10-CM

## 2024-03-05 NOTE — Progress Notes (Signed)
 Had pap 8 years ago. States abnormal but did not go back for recheck. Denies issues today. Defer STI testing. Last partner 7 years ago.

## 2024-03-05 NOTE — Progress Notes (Signed)
 Subjective:        [name] is a 48 y.o. female here for a routine exam.  Current complaints: None.    Personal health questionnaire:  Is patient Ashkenazi Jewish, have a family history of breast and/or ovarian cancer: no Is there a family history of uterine cancer diagnosed at age < 62, gastrointestinal cancer, urinary tract cancer, family member who is a Personnel officer syndrome-associated carrier: no Is the patient overweight and hypertensive, family history of diabetes, personal history of gestational diabetes, preeclampsia or PCOS: yes Is patient over 51, have PCOS,  family history of premature CHD under age 60, diabetes, smoke, have hypertension or peripheral artery disease:  no At any time, has a partner hit, kicked or otherwise hurt or frightened you?: no Over the past 2 weeks, have you felt down, depressed or hopeless?: no Over the past 2 weeks, have you felt little interest or pleasure in doing things?:no   Gynecologic History Patient's last menstrual period was 08/03/2019 (approximate). Contraception: post menopausal status Last Pap: ~ 8 years ago. Results were: abnormal Last mammogram: unknown. Results were: normal  Obstetric History OB History  Gravida Para Term Preterm AB Living  6 5 5  0 0   SAB IAB Ectopic Multiple Live Births  0 0 0      # Outcome Date GA Lbr Len/2nd Weight Sex Type Anes PTL Lv  6 Term 11/20/03     Vag-Spont     5 Term 12/29/00     Vag-Spont     4 Term 05/19/96 [redacted]w[redacted]d    Vag-Spont     3 Term 10/03/94     Vag-Spont     2 Term 09/04/92     Vag-Spont     1 Gravida             Past Medical History:  Diagnosis Date   Anxiety    Crohn's disease (HCC)    Diabetes mellitus    DKA (diabetic ketoacidosis) (HCC)    Hypertension    Insomnia    Neuropathy     Past Surgical History:  Procedure Laterality Date   COLONOSCOPY     HERNIA REPAIR     RECTAL SURGERY     RIGHT/LEFT HEART CATH AND CORONARY ANGIOGRAPHY N/A 04/21/2022   Procedure:  RIGHT/LEFT HEART CATH AND CORONARY ANGIOGRAPHY;  Surgeon: Claudene Pacific, MD;  Location: [facility];  Service: Cardiovascular;  Laterality: N/A;     Current Outpatient Medications:    ACCU-CHEK GUIDE test strip, 1 each by Other route in the morning, at noon, in the evening, and at bedtime. Use as instructed, Disp: 400 each, Rfl: 3   acetaminophen  (TYLENOL ) 325 MG tablet, Take 650 mg by mouth every 6 (six) hours as needed for mild pain or moderate pain., Disp: , Rfl:    atorvastatin  (LIPITOR ) 80 MG tablet, TAKE 1 Tablet BY MOUTH ONCE DAILY at bedtime (bedtime), Disp: 90 tablet, Rfl: 3   Blood Glucose Monitoring Suppl (ACCU-CHEK GUIDE ME) w/Device KIT, 1 Device by Does not apply route in the morning, at noon, in the evening, and at bedtime., Disp: 1 kit, Rfl: 0   Continuous Glucose Sensor (DEXCOM G7 SENSOR) MISC, 1 Device by Does not apply route as directed., Disp: 9 each, Rfl: 3   dapagliflozin  propanediol (FARXIGA ) 10 MG TABS tablet, Take 1 tablet (10 mg total) by mouth daily., Disp: 90 tablet, Rfl: 3   ENTRESTO  24-26 MG, TAKE 1 Tablet BY MOUTH TWICE DAILY (morning, bedtime), Disp:  60 tablet, Rfl: 11   escitalopram  (LEXAPRO ) 20 MG tablet, Take 20 mg by mouth daily., Disp: , Rfl:    furosemide  (LASIX ) 20 MG tablet, TAKE 1 Tablet BY MOUTH ONCE DAILY (BOTTLE), Disp: 30 tablet, Rfl: 5   icosapent  Ethyl (VASCEPA ) 1 g capsule, Take 2 capsules (2 g total) by mouth 2 (two) times daily., Disp: 360 capsule, Rfl: 3   Insulin  Disposable Pump (OMNIPOD 5 DEXG7G6 PODS GEN 5) MISC, 1 Device by Other route every other day., Disp: 45 each, Rfl: 3   insulin  lispro (HUMALOG  KWIKPEN) 100 UNIT/ML KwikPen, Max daily 80 units per pump, Disp: 80 mL, Rfl: 2   insulin  lispro (HUMALOG ) 100 UNIT/ML injection, Max daily 100 units per pump, Disp: 100 mL, Rfl: 11   Insulin  Pen Needle 32G X 4 MM MISC, 1 Device by Does not apply route in the morning, at noon, in the evening, and at bedtime., Disp: 400 each, Rfl: 1    metFORMIN  (GLUCOPHAGE -XR) 500 MG 24 hr tablet, Take 1 tablet (500 mg total) by mouth 2 (two) times daily with a meal., Disp: 180 tablet, Rfl: 3   metoprolol  succinate (TOPROL -XL) 25 MG 24 hr tablet, Take 1 tablet (25 mg total) by mouth daily., Disp: 90 tablet, Rfl: 3   pantoprazole  (PROTONIX ) 40 MG tablet, Take 1 tablet (40 mg total) by mouth 2 (two) times daily., Disp: , Rfl:    spironolactone  (ALDACTONE ) 25 MG tablet, TAKE 1 Tablet BY MOUTH ONCE DAILY (MORNING), Disp: 30 tablet, Rfl: 5   tiZANidine  (ZANAFLEX ) 4 MG tablet, tizanidine  4 mg tablet TAKE 1 TABLET BY MOUTH EVERY 8 HOURS, Disp: , Rfl:    zolpidem  (AMBIEN ) 10 MG tablet, Take 10 mg by mouth at bedtime as needed for sleep., Disp: , Rfl:    ASPIRIN  LOW DOSE 81 MG tablet, TAKE 1 Tablet BY MOUTH ONCE DAILY. swallow whole. (bedtime) (Patient not taking: Reported on 03/05/2024), Disp: 30 tablet, Rfl: 12   carisoprodol (SOMA) 350 MG tablet, Soma 350 mg tablet Take 1 tablet 3 times a day by oral route as needed for 7 days., Disp: , Rfl:    dicyclomine (BENTYL) 10 MG capsule, Take 10 mg by mouth 3 (three) times daily as needed., Disp: , Rfl:    fluconazole  (DIFLUCAN ) 150 MG tablet, Take 1 tablet by mouth once. Repeat dose in 3 days if symptoms persist. (Patient not taking: Reported on 03/05/2024), Disp: 2 tablet, Rfl: 0   gabapentin  (NEURONTIN ) 300 MG capsule, Take 300 mg by mouth 3 (three) times daily. (Patient not taking: Reported on 03/05/2024), Disp: , Rfl:    lidocaine  (LIDODERM ) 5 %, Place 1 patch onto the skin daily. Remove & Discard patch within 12 hours or as directed by MD, Disp: 30 patch, Rfl: 0   nitroGLYCERIN  (NITROSTAT ) 0.4 MG SL tablet, Place 1 tablet (0.4 mg total) under the tongue every 5 (five) minutes x 3 doses as needed for chest pain. (Patient not taking: Reported on 03/05/2024), Disp: 25 tablet, Rfl: 3   nystatin  (MYCOSTATIN ) 100000 UNIT/ML suspension, Take 5 mLs (500,000 Units total) by mouth 4 (four) times daily., Disp: 60 mL, Rfl:  0   nystatin  (MYCOSTATIN /NYSTOP ) powder, Apply topically 4 (four) times daily. (Patient not taking: Reported on 03/05/2024), Disp: 60 g, Rfl: 3   promethazine  (PHENERGAN ) 25 MG tablet, promethazine  25 mg tablet  Take 1 tablet every 4 hours by oral route as needed. (Patient not taking: Reported on 03/05/2024), Disp: , Rfl:    XIFAXAN 550 MG TABS  tablet, Take 550 mg by mouth 3 (three) times daily., Disp: , Rfl:  Allergies  Allergen Reactions   Ceclor [Cefaclor] Hives    Social History   Tobacco Use   Smoking status: Every Day    Current packs/day: 0.50    Types: Cigarettes   Smokeless tobacco: Never  Substance Use Topics   Alcohol use: No    Family History  Problem Relation Age of Onset   Heart disease Father    Crohn's disease Father    Colon cancer Neg Hx    Rectal cancer Neg Hx    Esophageal cancer Neg Hx       Review of Systems  Constitutional: negative for fatigue and weight loss Respiratory: negative for cough and wheezing Cardiovascular: negative for chest pain, fatigue and palpitations Gastrointestinal: negative for abdominal pain and change in bowel habits Musculoskeletal:negative for myalgias Neurological: negative for gait problems and tremors Behavioral/Psych: negative for abusive relationship, depression Endocrine: negative for temperature intolerance    Genitourinary:negative for abnormal menstrual periods, genital lesions, hot flashes, sexual problems and vaginal discharge Integument/breast: negative for breast lump, breast tenderness, nipple discharge and skin lesion(s)    Objective:       BP 119/76   Pulse 100   Ht 5' 4 (1.626 m)   Wt 243 lb (110.2 kg)   LMP 08/03/2019 (Approximate)   BMI 41.71 kg/m  General:   Alert and no distress  Skin:   no rash or abnormalities  Lungs:   clear to auscultation bilaterally  Heart:   regular rate and rhythm, S1, S2 normal, no murmur, click, rub or gallop  Breasts:   normal without suspicious masses, skin or  nipple changes or axillary nodes  Abdomen:  normal findings: no organomegaly, soft, non-tender and no hernia  Pelvis:  External genitalia: normal general appearance Urinary system: urethral meatus normal and bladder without fullness, nontender Vaginal: normal without tenderness, induration or masses Cervix: normal appearance Adnexa: normal bimanual exam Uterus: anteverted and non-tender, normal size   Lab Review Urine pregnancy test Labs reviewed yes Radiologic studies reviewed yes  I have spent a total of 20 minutes of face-to-face time, excluding clinical staff time, reviewing notes and preparing to see patient, ordering tests and/or medications, and counseling the patient.   Assessment:    1. Encounter for gynecological examination with Papanicolaou smear of cervix (Primary) Rx: - Cytology - PAP( [place])  2. Screening breast examination Rx: - MM 3D SCREENING MAMMOGRAM BILATERAL BREAST; Future  3. Hypoestrogenism Rx: - DG BONE DENSITY (DXA); Future  4. Chronic systolic congestive heart failure (HCC) - clinically stable - managed by Cardiology  5. Gastrointestinal Crohn's disease (HCC) - managed by GI  6. Hypertension, unspecified type - clinically stable - managed by PCP  7. Type 2 diabetes mellitus with hyperosmolarity without coma, with long-term current use of insulin  (HCC) - managed by PCP  8. Class 3 severe obesity due to excess calories without serious comorbidity with body mass index (BMI) of 40.0 to 44.9 in adult - weight reduction with the aid of dietary changes, exercise and behavioral modification recommended      Plan:    Education reviewed: calcium  supplements, depression evaluation, low fat, low cholesterol diet, safe sex/STD prevention, self breast exams, and weight bearing exercise. Mammogram ordered. Follow up in: 1 year. Bone Density Study ordered    Orders Placed This Encounter  Procedures   MM 3D SCREENING MAMMOGRAM BILATERAL BREAST     Standing Status:   Future  Expiration Date:   03/05/2025    Reason for Exam (SYMPTOM  OR DIAGNOSIS REQUIRED):   Needs routine screening    Is the patient pregnant?:   No    Preferred imaging location?:   [facility]   DG BONE DENSITY (DXA)    Standing Status:   Future    Expiration Date:   03/05/2025    Reason for Exam (SYMPTOM  OR DIAGNOSIS REQUIRED):   Hypoestronenism    Is patient pregnant?:   No    Preferred imaging location?:   [facility]    CARLIN RONAL CENTERS, MD, FACOG Attending Obstetrician & Gynecologist, [facility] for [facility], [facility], [place] 03/05/2024

## 2024-03-06 LAB — CYTOLOGY - PAP
Comment: NEGATIVE
Diagnosis: NEGATIVE
High risk HPV: NEGATIVE

## 2024-03-20 ENCOUNTER — Other Ambulatory Visit (HOSPITAL_COMMUNITY): Payer: Self-pay | Admitting: Cardiology

## 2024-03-22 ENCOUNTER — Ambulatory Visit: Admitting: Internal Medicine

## 2024-03-26 ENCOUNTER — Ambulatory Visit (HOSPITAL_BASED_OUTPATIENT_CLINIC_OR_DEPARTMENT_OTHER)
Admission: RE | Admit: 2024-03-26 | Discharge: 2024-03-26 | Disposition: A | Discharge status: Home / Self Care | Source: Ambulatory Visit | Attending: Obstetrics | Admitting: Obstetrics

## 2024-03-26 ENCOUNTER — Ambulatory Visit (HOSPITAL_BASED_OUTPATIENT_CLINIC_OR_DEPARTMENT_OTHER)
Admission: RE | Admit: 2024-03-26 | Discharge: 2024-03-26 | Disposition: A | Source: Ambulatory Visit | Attending: Obstetrics | Admitting: Obstetrics

## 2024-03-26 ENCOUNTER — Encounter (HOSPITAL_BASED_OUTPATIENT_CLINIC_OR_DEPARTMENT_OTHER): Payer: Self-pay

## 2024-03-26 DIAGNOSIS — Z1239 Encounter for other screening for malignant neoplasm of breast: Secondary | ICD-10-CM | POA: Diagnosis present

## 2024-03-26 DIAGNOSIS — E2839 Other primary ovarian failure: Secondary | ICD-10-CM | POA: Insufficient documentation

## 2024-03-29 ENCOUNTER — Other Ambulatory Visit: Payer: Self-pay | Admitting: Internal Medicine

## 2024-03-29 DIAGNOSIS — R928 Other abnormal and inconclusive findings on diagnostic imaging of breast: Secondary | ICD-10-CM

## 2024-04-04 ENCOUNTER — Encounter: Payer: Self-pay | Admitting: Internal Medicine

## 2024-04-04 ENCOUNTER — Ambulatory Visit: Admitting: Internal Medicine

## 2024-04-04 VITALS — BP 122/78 | HR 101 | Temp 97.8°F | Ht 65.0 in | Wt 246.8 lb

## 2024-04-04 DIAGNOSIS — M7711 Lateral epicondylitis, right elbow: Secondary | ICD-10-CM

## 2024-04-04 DIAGNOSIS — E1169 Type 2 diabetes mellitus with other specified complication: Secondary | ICD-10-CM | POA: Diagnosis not present

## 2024-04-04 DIAGNOSIS — G8929 Other chronic pain: Secondary | ICD-10-CM

## 2024-04-04 DIAGNOSIS — M542 Cervicalgia: Secondary | ICD-10-CM

## 2024-04-04 DIAGNOSIS — F419 Anxiety disorder, unspecified: Secondary | ICD-10-CM

## 2024-04-04 DIAGNOSIS — F32A Depression, unspecified: Secondary | ICD-10-CM

## 2024-04-04 DIAGNOSIS — I10 Essential (primary) hypertension: Secondary | ICD-10-CM | POA: Diagnosis not present

## 2024-04-04 DIAGNOSIS — Z794 Long term (current) use of insulin: Secondary | ICD-10-CM

## 2024-04-04 DIAGNOSIS — R519 Headache, unspecified: Secondary | ICD-10-CM

## 2024-04-04 DIAGNOSIS — M7712 Lateral epicondylitis, left elbow: Secondary | ICD-10-CM

## 2024-04-04 MED ORDER — VENLAFAXINE HCL ER 37.5 MG PO CP24: 37.5000 mg | ORAL_CAPSULE | Freq: Every day | ORAL | 0 refills | Status: DC

## 2024-04-04 NOTE — Progress Notes (Unsigned)
 [facility] [address] [place] [address] Dept: [phone] Dept Fax: [phone]    Subjective:   [name] 07/17/1975 04/04/2024  Chief Complaint  Patient presents with   Follow-up    3 months headache eyes are swollen, elbow pain, abnormal mammogram     HPI: [name] presents today for re-assessment and management of chronic medical conditions.  Discussed the use of AI scribe software for clinical note transcription with the patient, who gave verbal consent to proceed.  History of Present Illness   [name] is a 48 year old female with a medical history of type 2 diabetes, SIBO, hypertension, congestive heart failure, psoriasis, anxiety, and NSTEMI.  She presents for her first follow up office visit today after establishing care with PCP in July.   Her diabetes is managed by endocrinology.  CHF managed by cardiology.   She feels anxious and depressed, noting she has not been on any antidepressant medication for four months. Lexapro , which she was previously prescribed, she reports has stopped working. She is unsure of other medications she has tried in the past.   She has been experiencing a persistent headache for three months, described as a constant throbbing pain that does not fully resolve with Tylenol . She has not had this evaluated prior. The headache is accompanied by swollen upper eyelids, photophobia, nausea, and blurry vision. She attributes the nausea to her SIBO and suspects the blurry vision is related to her diabetes as this is chronic. She has not seen her eye doctor for routine eye checks due to her diabetes. No recent neck injury is reported, but she notes neck soreness and pain radiating from her neck to her head.  She describes bilateral elbow pain, initially starting in the left elbow six to eight months ago and now affecting both elbows. The pain is severe enough to  prevent her from lifting heavy objects. Tylenol  has not been effective. She has not engaged in physical therapy. She has been diagnosed in the past with tennis elbow.         04/04/2024    9:10 AM 12/21/2023    9:04 AM  Depression screen PHQ 2/9  Decreased Interest 3 3  Down, Depressed, Hopeless 3 2  PHQ - 2 Score 6 5  Altered sleeping 3 2  Tired, decreased energy 3 3  Change in appetite 0 3  Feeling bad or failure about yourself  3 2  Trouble concentrating 2 1  Moving slowly or fidgety/restless 2 0  Suicidal thoughts 1 0  PHQ-9 Score 20 16  Difficult doing work/chores Very difficult Not difficult at all      04/04/2024    9:09 AM 12/21/2023    9:04 AM  GAD 7 : Generalized Anxiety Score  Nervous, Anxious, on Edge 3 2  Control/stop worrying 3 2  Worry too much - different things 3 2  Trouble relaxing 3 2  Restless 2 0  Easily annoyed or irritable 3 3  Afraid - awful might happen 2 1  Total GAD 7 Score 19 12  Anxiety Difficulty Extremely difficult       The following portions of the patient's history were reviewed and updated as appropriate: past medical history, past surgical history, family history, social history, allergies, medications, and problem list.   Patient Active Problem List   Diagnosis Date Noted   Marijuana use 01/08/2024   Chronic systolic congestive heart failure (HCC) 12/21/2023  Yeast dermatitis 12/21/2023   Thrush 12/21/2023   Small intestinal bacterial overgrowth (SIBO) 12/21/2023   Microalbuminuria 07/07/2023   NSTEMI (non-ST elevated myocardial infarction) (HCC) 04/20/2022   Anxiety 04/19/2022   Depressive disorder 04/19/2022   Chronic back pain 04/19/2022   Gastrointestinal Crohn's disease (HCC) 04/19/2022   Neuropathy 04/19/2022   Acute coronary syndrome (HCC) 04/19/2022   Intractable nausea and vomiting 02/22/2021   Type 2 diabetes mellitus (HCC) 10/20/2020   Dyslipidemia 10/20/2020   Hyperlipidemia 07/17/2020   Hypertensive disorder  07/17/2020   Past Medical History:  Diagnosis Date   Anxiety    Crohn's disease (HCC)    Diabetes mellitus    DKA (diabetic ketoacidosis) (HCC)    Hypertension    Insomnia    Neuropathy    Past Surgical History:  Procedure Laterality Date   COLONOSCOPY     HERNIA REPAIR     RECTAL SURGERY     RIGHT/LEFT HEART CATH AND CORONARY ANGIOGRAPHY N/A 04/21/2022   Procedure: RIGHT/LEFT HEART CATH AND CORONARY ANGIOGRAPHY;  Surgeon: Claudene Pacific, MD;  Location: [facility];  Service: Cardiovascular;  Laterality: N/A;   Family History  Problem Relation Age of Onset   Heart disease Father    Crohn's disease Father    Colon cancer Neg Hx    Rectal cancer Neg Hx    Esophageal cancer Neg Hx     Current Outpatient Medications:    albuterol (PROVENTIL) (2.5 MG/3ML) 0.083% nebulizer solution, Inhale 2.5 mg into the lungs., Disp: , Rfl:    ENTRESTO  24-26 MG, TAKE 1 Tablet BY MOUTH TWICE DAILY (morning, bedtime), Disp: 60 tablet, Rfl: 11   venlafaxine XR (EFFEXOR XR) 37.5 MG 24 hr capsule, Take 1 capsule (37.5 mg total) by mouth daily., Disp: 90 capsule, Rfl: 0   ACCU-CHEK GUIDE test strip, 1 each by Other route in the morning, at noon, in the evening, and at bedtime. Use as instructed, Disp: 400 each, Rfl: 3   acetaminophen  (TYLENOL ) 325 MG tablet, Take 650 mg by mouth every 6 (six) hours as needed for mild pain or moderate pain., Disp: , Rfl:    albuterol (VENTOLIN HFA) 108 (90 Base) MCG/ACT inhaler, SMARTSIG:2 Puff(s) By Mouth 4 Times Daily PRN, Disp: , Rfl:    ASPIRIN  LOW DOSE 81 MG tablet, TAKE 1 Tablet BY MOUTH ONCE DAILY. swallow whole. (bedtime) (Patient not taking: Reported on 03/05/2024), Disp: 30 tablet, Rfl: 12   atorvastatin  (LIPITOR ) 80 MG tablet, TAKE 1 Tablet BY MOUTH ONCE DAILY at bedtime (bedtime), Disp: 90 tablet, Rfl: 3   Blood Glucose Monitoring Suppl (ACCU-CHEK GUIDE ME) w/Device KIT, 1 Device by Does not apply route in the morning, at noon, in the evening, and at  bedtime., Disp: 1 kit, Rfl: 0   carisoprodol (SOMA) 350 MG tablet, Soma 350 mg tablet Take 1 tablet 3 times a day by oral route as needed for 7 days., Disp: , Rfl:    Continuous Glucose Sensor (DEXCOM G7 SENSOR) MISC, 1 Device by Does not apply route as directed., Disp: 9 each, Rfl: 3   dapagliflozin  propanediol (FARXIGA ) 10 MG TABS tablet, Take 1 tablet (10 mg total) by mouth daily., Disp: 90 tablet, Rfl: 3   dicyclomine (BENTYL) 10 MG capsule, Take 10 mg by mouth 3 (three) times daily as needed., Disp: , Rfl:    furosemide  (LASIX ) 20 MG tablet, TAKE 1 Tablet BY MOUTH ONCE DAILY (BOTTLE), Disp: 30 tablet, Rfl: 5   icosapent  Ethyl (VASCEPA ) 1 g capsule, Take  2 capsules (2 g total) by mouth 2 (two) times daily., Disp: 360 capsule, Rfl: 3   Insulin  Disposable Pump (OMNIPOD 5 DEXG7G6 PODS GEN 5) MISC, 1 Device by Other route every other day., Disp: 45 each, Rfl: 3   insulin  lispro (HUMALOG  KWIKPEN) 100 UNIT/ML KwikPen, Max daily 80 units per pump, Disp: 80 mL, Rfl: 2   insulin  lispro (HUMALOG ) 100 UNIT/ML injection, Max daily 100 units per pump, Disp: 100 mL, Rfl: 11   Insulin  Pen Needle 32G X 4 MM MISC, 1 Device by Does not apply route in the morning, at noon, in the evening, and at bedtime., Disp: 400 each, Rfl: 1   lidocaine  (LIDODERM ) 5 %, Place 1 patch onto the skin daily. Remove & Discard patch within 12 hours or as directed by MD, Disp: 30 patch, Rfl: 0   metFORMIN  (GLUCOPHAGE -XR) 500 MG 24 hr tablet, Take 1 tablet (500 mg total) by mouth 2 (two) times daily with a meal., Disp: 180 tablet, Rfl: 3   metoprolol  succinate (TOPROL -XL) 25 MG 24 hr tablet, Take 1 tablet (25 mg total) by mouth daily., Disp: 90 tablet, Rfl: 3   nitroGLYCERIN  (NITROSTAT ) 0.4 MG SL tablet, Place 1 tablet (0.4 mg total) under the tongue every 5 (five) minutes x 3 doses as needed for chest pain. (Patient not taking: Reported on 03/05/2024), Disp: 25 tablet, Rfl: 3   nystatin  (MYCOSTATIN /NYSTOP ) powder, Apply topically 4 (four)  times daily. (Patient not taking: Reported on 03/05/2024), Disp: 60 g, Rfl: 3   pantoprazole  (PROTONIX ) 40 MG tablet, Take 1 tablet (40 mg total) by mouth 2 (two) times daily., Disp: , Rfl:    spironolactone  (ALDACTONE ) 25 MG tablet, TAKE 1 Tablet BY MOUTH ONCE DAILY (MORNING), Disp: 30 tablet, Rfl: 5   tiZANidine  (ZANAFLEX ) 4 MG tablet, tizanidine  4 mg tablet TAKE 1 TABLET BY MOUTH EVERY 8 HOURS, Disp: , Rfl:    XIFAXAN 550 MG TABS tablet, Take 550 mg by mouth 3 (three) times daily., Disp: , Rfl:    zolpidem  (AMBIEN ) 10 MG tablet, Take 10 mg by mouth at bedtime as needed for sleep., Disp: , Rfl:  Allergies  Allergen Reactions   Ceclor [Cefaclor] Hives     ROS: A complete ROS was performed with pertinent positives/negatives noted in the HPI. The remainder of the ROS are negative.    Objective:   Today's Vitals   04/04/24 0843  BP: 122/78  Pulse: (!) 101  Temp: 97.8 F (36.6 C)  TempSrc: Temporal  SpO2: 96%  Weight: 246 lb 12.8 oz (111.9 kg)  Height: 5' 5 (1.651 m)    GENERAL: Well-appearing, in NAD. Well nourished.  SKIN: Pink, warm and dry. No rash, lesion, ulceration, or ecchymoses.  HEENT:    HEAD: Normocephalic, non-traumatic.  EYES: Conjunctive pink without exudate. PERRL, EOMI. Bilateral upper eyelid enlargement without redness or warmth.  NECK: Trachea midline. Full ROM w/o pain or tenderness. No lymphadenopathy. Poor posture.  RESPIRATORY: Chest wall symmetrical. Respirations even and non-labored. Breath sounds clear to auscultation bilaterally.  CARDIAC: S1, S2 present, regular rate and rhythm. Peripheral pulses 2+ bilaterally.  MSK: Muscle tone and strength appropriate for age. Pain with palpation to lateral epicondyle of left arm  EXTREMITIES: Without clubbing, cyanosis, or edema.  NEUROLOGIC: Steady, even gait.  PSYCH/MENTAL STATUS: Alert, oriented x 3. Cooperative, appropriate mood and affect.   Health Maintenance Due  Topic Date Due   Hepatitis C Screening   Never done   DTaP/Tdap/Td (1 - Tdap) Never done  Hepatitis B Vaccines 19-59 Average Risk (1 of 3 - 19+ 3-dose series) Never done   Influenza Vaccine  01/05/2024   COVID-19 Vaccine (4 - 2025-26 season) 02/05/2024    No results found for any visits on 04/04/24.  The ASCVD Risk score (Arnett DK, et al., 2019) failed to calculate for the following reasons:   Risk score cannot be calculated because patient has a medical history suggesting prior/existing ASCVD     Assessment & Plan:  Assessment and Plan  Type 2 diabetes  - continue management with endocrinology   Hypertension  - stable    Depression and anxiety Chronic depression and anxiety with increased symptoms. Lexapro  ineffective. Effexor chosen for depression, anxiety, and migraine prevention. - Prescribe Effexor for depression, anxiety, and migraine prevention. - Advise follow-up in 6 weeks to assess response to Effexor.  Chronic headache with neck pain Chronic headache with neck pain.  Effexor selected for headache and mood symptoms. - Prescribe Effexor for headache management. - Recommend follow-up with an eye doctor for a yearly eye exam, especially considering diabetes. - Neck pain can be contributing to persistent headache. Continue f/u with ortho as referred.  - Advise follow-up in 6 weeks to assess response to Effexor.  Bilateral lateral epicondylitis (tennis elbow) - ineffective previous conservative measures. - Refer to orthopedist for evaluation and management. - Consider physical therapy for strengthening and pain management. - Discuss potential for steroid injection if deemed appropriate by orthopedist.  General Health Maintenance Recent mammogram and upcoming diagnostic imaging for left breast asymmetry. Awaiting further imaging results. - Monitor results of upcoming ultrasound and diagnostic mammogram for left breast. - Advise follow-up with endocrinologist for diabetes management. - Encourage routine  follow-up with cardiologist for heart health.       Orders Placed This Encounter  Procedures   Ambulatory referral to Orthopedics    Referral Priority:   Routine    Referral Type:   Consultation    Number of Visits Requested:   1   No images are attached to the encounter or orders placed in the encounter. Meds ordered this encounter  Medications   venlafaxine XR (EFFEXOR XR) 37.5 MG 24 hr capsule    Sig: Take 1 capsule (37.5 mg total) by mouth daily.    Dispense:  90 capsule    Refill:  0    Supervising Provider:   SEBASTIAN BEVERLEY NOVAK [[id number]]    Return in about 6 weeks (around 05/16/2024) for anxiety, depression, headache.   Rosina Senters, FNP

## 2024-04-10 ENCOUNTER — Encounter: Payer: Self-pay | Admitting: Internal Medicine

## 2024-04-11 ENCOUNTER — Ambulatory Visit
Admission: RE | Admit: 2024-04-11 | Discharge: 2024-04-11 | Disposition: A | Discharge status: Home / Self Care | Source: Ambulatory Visit | Attending: Internal Medicine | Admitting: Internal Medicine

## 2024-04-11 ENCOUNTER — Ambulatory Visit: Payer: Self-pay | Admitting: Internal Medicine

## 2024-04-11 ENCOUNTER — Other Ambulatory Visit: Payer: Self-pay | Admitting: Internal Medicine

## 2024-04-11 ENCOUNTER — Encounter: Payer: Self-pay | Admitting: Internal Medicine

## 2024-04-11 DIAGNOSIS — R928 Other abnormal and inconclusive findings on diagnostic imaging of breast: Secondary | ICD-10-CM

## 2024-04-11 DIAGNOSIS — N6320 Unspecified lump in the left breast, unspecified quadrant: Secondary | ICD-10-CM

## 2024-04-16 ENCOUNTER — Ambulatory Visit: Admitting: Internal Medicine

## 2024-04-16 ENCOUNTER — Ambulatory Visit
Admission: RE | Admit: 2024-04-16 | Discharge: 2024-04-16 | Disposition: A | Discharge status: Home / Self Care | Source: Ambulatory Visit | Attending: Internal Medicine | Admitting: Internal Medicine

## 2024-04-16 DIAGNOSIS — R928 Other abnormal and inconclusive findings on diagnostic imaging of breast: Secondary | ICD-10-CM

## 2024-04-16 DIAGNOSIS — N6320 Unspecified lump in the left breast, unspecified quadrant: Secondary | ICD-10-CM

## 2024-04-17 ENCOUNTER — Telehealth: Payer: Self-pay

## 2024-04-17 ENCOUNTER — Other Ambulatory Visit: Payer: Self-pay | Admitting: Internal Medicine

## 2024-04-17 DIAGNOSIS — E785 Hyperlipidemia, unspecified: Secondary | ICD-10-CM

## 2024-04-17 NOTE — Telephone Encounter (Signed)
 Copied from CRM [id number]. Topic: Clinical - Medication Question >> Apr 17, 2024  1:40 PM [name] wrote: Reason for CRM: Pharmacy calling- Patient was prescribed venlafaxine XR (EFFEXOR XR) 37.5 MG 24 hr capsule and she is taking laxapro from another provider- pharmacy wanting to know if she should be taking both... please contact pharmacy

## 2024-04-17 NOTE — Telephone Encounter (Signed)
 No patient should not take both. Patient stated to me at her office visit that she was no longer taking the Lexapro . She should only be taking the venlafaxine.

## 2024-04-17 NOTE — Telephone Encounter (Signed)
 Patient was prescribed venlafaxine XR (EFFEXOR XR) 37.5 MG 24 hr capsule and she is taking laxapro from another provider- pharmacy wanting to know if she should be taking both   Please advise

## 2024-04-18 ENCOUNTER — Ambulatory Visit: Admitting: Internal Medicine

## 2024-04-22 ENCOUNTER — Ambulatory Visit: Payer: Self-pay | Admitting: Internal Medicine

## 2024-04-22 ENCOUNTER — Ambulatory Visit
Admission: RE | Admit: 2024-04-22 | Discharge: 2024-04-22 | Disposition: A | Source: Ambulatory Visit | Attending: Internal Medicine | Admitting: Internal Medicine

## 2024-04-22 ENCOUNTER — Ambulatory Visit (INDEPENDENT_AMBULATORY_CARE_PROVIDER_SITE_OTHER): Admitting: Internal Medicine

## 2024-04-22 ENCOUNTER — Telehealth: Payer: Self-pay

## 2024-04-22 VITALS — BP 122/84 | Ht 65.0 in | Wt 246.0 lb

## 2024-04-22 DIAGNOSIS — R928 Other abnormal and inconclusive findings on diagnostic imaging of breast: Secondary | ICD-10-CM

## 2024-04-22 DIAGNOSIS — Z794 Long term (current) use of insulin: Secondary | ICD-10-CM

## 2024-04-22 DIAGNOSIS — E1165 Type 2 diabetes mellitus with hyperglycemia: Secondary | ICD-10-CM

## 2024-04-22 HISTORY — PX: BREAST BIOPSY: SHX20

## 2024-04-22 LAB — POCT GLYCOSYLATED HEMOGLOBIN (HGB A1C): Hemoglobin A1C: 8.8 % — AB (ref 4.0–5.6)

## 2024-04-22 MED ORDER — DEXCOM G7 SENSOR MISC: 1.0000 | 3 refills | Status: DC

## 2024-04-22 MED ORDER — OMNIPOD 5 DEXG7G6 PODS GEN 5 MISC: 1.0000 | 3 refills | Status: DC

## 2024-04-22 MED ORDER — LANTUS SOLOSTAR 100 UNIT/ML ~~LOC~~ SOPN: 60.0000 [IU] | PEN_INJECTOR | Freq: Every day | SUBCUTANEOUS | 3 refills | Status: AC

## 2024-04-22 MED ORDER — INSULIN LISPRO (1 UNIT DIAL) 100 UNIT/ML (KWIKPEN): PEN_INJECTOR | SUBCUTANEOUS | 2 refills | Status: DC

## 2024-04-22 MED ORDER — DAPAGLIFLOZIN PROPANEDIOL 10 MG PO TABS: 10.0000 mg | ORAL_TABLET | Freq: Every day | ORAL | 3 refills | Status: AC

## 2024-04-22 MED ORDER — INSULIN PEN NEEDLE 32G X 4 MM MISC: 1.0000 | Freq: Four times a day (QID) | 1 refills | Status: AC

## 2024-04-22 MED ORDER — METFORMIN HCL ER 500 MG PO TB24: 500.0000 mg | ORAL_TABLET | Freq: Two times a day (BID) | ORAL | 3 refills | Status: AC

## 2024-04-22 NOTE — Progress Notes (Signed)
 Name: [name]  Age/ Sex: 48 y.o., female   MRN/ DOB: [id number], [birth year]     PCP: Billy Knee, FNP   Reason for Endocrinology Evaluation: Type 2 Diabetes Mellitus  Initial Endocrine Consultative Visit: 10/20/2020    PATIENT IDENTIFIER: [name] is a 48 y.o. female with a past medical history of T2DM, Dyslipidemia, Crohn's disease, CAD, CHF  and HTN. The patient has followed with Endocrinology clinic since 10/20/2020 for consultative assistance with management of her diabetes.  DIABETIC HISTORY:  [name] was diagnosed with DM as gestational diabetes at age 49, by age 70 she was started on Metformin  . Insulin  was started in her 54's. Her hemoglobin A1c has ranged from 7.9% in 2012, peaking at 12.8% in 2022  ON her initial visit to our clinic  she had an A1c of 12.8 % . She had Metformin , lantus  and Humalog  on her list but had not taken insulin  in 2 months by then. We continued Metformin , started Farxiga  and restarted insulin  .  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 She checks her blood sugars multiple times daily through CGM.SABRA The patient has not had hypoglycemic episodes since the last clinic visit. She is symptomatic with these symptoms    She was evaluated by general surgery for umbilical hernia 11/2023  She has Crohn's with chronic diarrhea - is following up [facility] Per Pt gastroparesis has been ruled out  No abdominal pain  Has occasional nausea with minimal vomiting    This patient with type 2 diabetes is treated with omnipod (insulin  pump). During the  visit the pump basal and bolus doses were reviewed including carb/insulin  rations and supplemental doses. The clinical list was updated. The glucose meter download was reviewed in detail to determine if the current pump settings are providing the best glycemic control without excessive hypoglycemia.  Pump and meter download:      Pump   OmniPod Settings   Insulin  type   Humalog    Basal rate       0000 1.7 u/h    0900 1.6          I:C ratio       0000 1:1 #14 with a meal, #7 with snack                  Sensitivity       0000  10      Goal       0000  120            Type & Model of Pump: OmniPod Insulin  Type: Currently using Humalog .  There is no height or weight on file to calculate BMI.  PUMP STATISTICS: Average BG:245 Average Daily Carbs (g):16.4 Average Total Daily Insulin : 81.4 Average Daily Basal: 55.7 (68%) Average Daily Bolus: 25.7 (32%)   CONTINUOUS GLUCOSE MONITORING RECORD INTERPRETATION    Dates of Recording:6 11/1 - 04/22/2024  Sensor description: Dexcom  Results statistics:   CGM use % of time 94.1%  Average and SD 245/59  Time in range  14%  % Time Above 180 44  % Time above 250 42  % Time Below target 0   Glycemic patterns summary: BGs elevated throughout the day and night  Hyperglycemic episodes all day and night  Hypoglycemic episodes occurred N/A  Overnight periods: High   HOME DIABETES REGIMEN:  Metformin  500 mg , 1 tablet Twice daily  Farxiga  10 mg, 1 tablet in the morning  Humalog      Statin: yes ACE-I/ARB: yes Prior Diabetic Education: yes     DIABETIC COMPLICATIONS: Microvascular complications:  neuropathy Denies: CKD, retinopathy Last Eye Exam: Completed 10/2020  Macrovascular complications:   Denies: CAD, CVA, PVD   HISTORY:  Past Medical History:  Past Medical History:  Diagnosis Date   Anxiety    Crohn's disease (HCC)    Diabetes mellitus    DKA (diabetic ketoacidosis) (HCC)    Hypertension     Insomnia    Neuropathy    Past Surgical History:  Past Surgical History:  Procedure Laterality Date   COLONOSCOPY     HERNIA REPAIR     RECTAL SURGERY     RIGHT/LEFT HEART CATH AND CORONARY ANGIOGRAPHY N/A 04/21/2022   Procedure: RIGHT/LEFT HEART CATH AND CORONARY ANGIOGRAPHY;  Surgeon: Claudene Pacific, MD;  Location: [facility];  Service: Cardiovascular;  Laterality: N/A;   Social History:  reports that she has been smoking cigarettes. She has never used smokeless tobacco. She reports current drug use. Drug: Marijuana. She reports that she does not drink alcohol. Family History:  Family History  Problem Relation Age of Onset   Heart disease Father    Crohn's disease Father    Colon cancer Neg Hx    Rectal cancer Neg Hx    Esophageal cancer Neg Hx      HOME MEDICATIONS: Allergies as of 04/22/2024       Reactions   Ceclor [cefaclor] Hives        Medication List        Accurate as of April 22, 2024  7:34 AM. If you have any questions, ask your nurse or doctor.          Accu-Chek Guide Me w/Device Kit 1 Device by Does not apply route in the morning, at noon, in the evening, and at bedtime.   Accu-Chek Guide test strip Generic drug: glucose blood 1 each by Other route in the morning, at noon, in the evening, and at bedtime. Use as instructed   acetaminophen  325 MG tablet Commonly known as: TYLENOL  Take 650 mg by mouth every 6 (six) hours as needed for mild pain or moderate pain.   albuterol 108 (90 Base) MCG/ACT inhaler Commonly known as: VENTOLIN HFA SMARTSIG:2 Puff(s) By Mouth 4 Times Daily PRN   albuterol (2.5 MG/3ML) 0.083% nebulizer solution Commonly known as: PROVENTIL Inhale 2.5 mg into the lungs.   Aspirin  Low Dose 81 MG tablet Generic drug: aspirin  EC TAKE 1 Tablet BY MOUTH ONCE DAILY. swallow whole. (bedtime)   atorvastatin  80 MG tablet Commonly known as: LIPITOR  TAKE 1 Tablet BY MOUTH ONCE DAILY at bedtime (bedtime)    carisoprodol 350 MG tablet Commonly known as: SOMA Soma 350 mg tablet Take 1 tablet 3 times a day by oral route as needed for 7 days.   dapagliflozin  propanediol 10 MG Tabs tablet Commonly known as: Farxiga  Take 1 tablet (10 mg total) by mouth daily.   Dexcom G7 Sensor Misc 1 Device by Does not apply route as directed.   dicyclomine 10 MG  capsule Commonly known as: BENTYL Take 10 mg by mouth 3 (three) times daily as needed.   Entresto  24-26 MG Generic drug: sacubitril-valsartan TAKE 1 Tablet BY MOUTH TWICE DAILY (morning, bedtime)   furosemide  20 MG tablet Commonly known as: LASIX  TAKE 1 Tablet BY MOUTH ONCE DAILY (BOTTLE)   icosapent  Ethyl 1 g capsule Commonly known as: Vascepa  Take 2 capsules (2 g total) by mouth 2 (two) times daily.   insulin  lispro 100 UNIT/ML KwikPen Commonly known as: HumaLOG  KwikPen Max daily 80 units per pump   insulin  lispro 100 UNIT/ML injection Commonly known as: HumaLOG  Max daily 100 units per pump   Insulin  Pen Needle 32G X 4 MM Misc 1 Device by Does not apply route in the morning, at noon, in the evening, and at bedtime.   lidocaine  5 % Commonly known as: Lidoderm  Place 1 patch onto the skin daily. Remove & Discard patch within 12 hours or as directed by MD   metFORMIN  500 MG 24 hr tablet Commonly known as: GLUCOPHAGE -XR Take 1 tablet (500 mg total) by mouth 2 (two) times daily with a meal.   metoprolol  succinate 25 MG 24 hr tablet Commonly known as: TOPROL -XL Take 1 tablet (25 mg total) by mouth daily.   nitroGLYCERIN  0.4 MG SL tablet Commonly known as: NITROSTAT  Place 1 tablet (0.4 mg total) under the tongue every 5 (five) minutes x 3 doses as needed for chest pain.   nystatin  powder Commonly known as: MYCOSTATIN /NYSTOP  Apply topically 4 (four) times daily.   Omnipod 5 DexG7G6 Pods Gen 5 Misc 1 Device by Other route every other day.   pantoprazole  40 MG tablet Commonly known as: Protonix  Take 1 tablet (40 mg total) by  mouth 2 (two) times daily.   spironolactone  25 MG tablet Commonly known as: ALDACTONE  TAKE 1 Tablet BY MOUTH ONCE DAILY (MORNING)   tiZANidine  4 MG tablet Commonly known as: ZANAFLEX  tizanidine  4 mg tablet TAKE 1 TABLET BY MOUTH EVERY 8 HOURS   venlafaxine XR 37.5 MG 24 hr capsule Commonly known as: Effexor XR Take 1 capsule (37.5 mg total) by mouth daily.   Xifaxan 550 MG Tabs tablet Generic drug: rifaximin Take 550 mg by mouth 3 (three) times daily.   zolpidem  10 MG tablet Commonly known as: AMBIEN  Take 10 mg by mouth at bedtime as needed for sleep.         OBJECTIVE:   Vital Signs: LMP 08/03/2019 (Approximate)   Wt Readings from Last 3 Encounters:  04/04/24 246 lb 12.8 oz (111.9 kg)  03/05/24 243 lb (110.2 kg)  12/21/23 236 lb 3.2 oz (107.1 kg)     Exam: General: Pt appears well and is in NAD  Lungs: Clear with good BS bilat   Heart: RRR  Extremities: No pretibial edema.   Neuro: MS is good with appropriate affect, pt is alert and Ox3   DM foot exam:12/12/2023   The skin of the feet is  without sores or ulcerations. The pedal pulses are 2+ on right and 2+ on left. The sensation is absent  to a screening 5.07, 10 gram monofilament bilaterally        DATA REVIEWED:  Lab Results  Component Value Date   HGBA1C 12.8 (A) 10/20/2020   HGBA1C (H) 08/05/2010    Latest Reference Range & Units 07/05/23 08:50  Total CHOL/HDL Ratio <5.0 (calc) 5.8 (H)  Cholesterol <200 mg/dL 812  HDL Cholesterol > OR = 50 mg/dL 32 (L)  LDL Cholesterol (Calc) mg/dL (calc)  Pend  MICROALB/CREAT RATIO <30 mg/g creat 46 (H)  Non-HDL Cholesterol (Calc) <130 mg/dL (calc) 844 (H)  Triglycerides <150 mg/dL 577 (H)    Latest Reference Range & Units 07/05/23 08:50  TSH mIU/L 1.02  T4,Free(Direct) 0.8 - 1.8 ng/dL 1.1    Latest Reference Range & Units 07/05/23 08:50  Microalb, Ur mg/dL 1.6  MICROALB/CREAT RATIO <30 mg/g creat 46 (H)  Creatinine, Urine 20 - 275 mg/dL 35  (H): Data  is abnormally high   Latest Reference Range & Units 07/05/23 08:50  Sodium 135 - 146 mmol/L 140  Potassium 3.5 - 5.3 mmol/L 3.8  Chloride 98 - 110 mmol/L 104  CO2 20 - 32 mmol/L 24  Glucose 65 - 99 mg/dL 741 (H)  BUN 7 - 25 mg/dL 14  Creatinine 9.49 - 9.00 mg/dL 9.23  Calcium  8.6 - 10.2 mg/dL 9.4  BUN/Creatinine Ratio 6 - 22 (calc) SEE NOTE:  (H): Data is abnormally high   ASSESSMENT / PLAN / RECOMMENDATIONS:   1) Type 2 Diabetes Mellitus, Poorly controlled, With neuropathic complications and microabuminuria - Most recent A1c of 8.8%. Goal A1c < 7.0 %.    -Patient continues with worsening glycemic control -She is not consistent with entering boluses with each meal/snack -I did encourage the patient to continue to enter carbohydrates into the pump as well as BG reading, we did demonstrate through the OmniPod receiver how she should use this with each meal - Intolerant to higher doses of metformin  -She does have gastroparesis, ulcerative colitis and other chronic GI issues, and would avoid GLP-1 agonist at this time - I will increase her basal rate as below MEDICATIONS: Continue Metformin  500 mg , 1 tablet Twice daily  Continue  Farxiga  10  mg, 1 tablet in the morning      Pump   OmniPod Settings   Insulin  type   Humalog    Basal rate       0000 1.80 u/h    0900 1.70          I:C ratio       0000 1:1 #14 with a meal, #7 with snack                  Sensitivity       0000  10      Goal       0000  120          EDUCATION / INSTRUCTIONS: BG monitoring instructions: Patient is instructed to check her blood sugars 3 times a day, before each meal . Call [facility] if: BG persistently < 70  I reviewed the Rule of 15 for the treatment of hypoglycemia in detail with the patient. Literature supplied.   2) Diabetic complications:  Eye: Does not have known diabetic retinopathy.  Neuro/ Feet: Does have known diabetic peripheral neuropathy .  Renal:  Patient does not have known baseline CKD. She   is not on an ACEI/ARB at present.     3) Dyslipidemia/CAD/CHF :    -Per cardiology   4) Microalbuminuria :  -MA/CR ratio elevated -Patient on Entresto  -Will encourage optimizing glucose control   F/U in 4 months     Signed electronically by: Stefano Redgie Butts, MD  [facility]  [facility] [address] [place], [place] [address] Phone: [phone] FAX: [phone]   CC: Billy Knee, FNP [address] [place] [address] Phone: [phone]  Fax: [phone]  Return to Endocrinology clinic  as below: Future Appointments  Date Time Provider Department Center  04/22/2024  8:30 AM [facility] PROCEDURES 1 [facility]  04/22/2024  1:20 PM Aceyn Kathol, Donell Cardinal, MD [facility] None  05/16/2024  9:20 AM Billy Knee, FNP [facility] [place] [facility]  07/31/2024 11:00 AM Orman Erminio POUR, PA-C [facility] None

## 2024-04-22 NOTE — Patient Instructions (Signed)
 Enter 14 grams with a meal and 7 grams with a snack    HOW TO TREAT LOW BLOOD SUGARS (Blood sugar LESS THAN 70 MG/DL) Please follow the RULE OF 15 for the treatment of hypoglycemia treatment (when your (blood sugars are less than 70 mg/dL)   STEP 1: Take 15 grams of carbohydrates when your blood sugar is low, which includes:  3-4 GLUCOSE TABS  OR 3-4 OZ OF JUICE OR REGULAR SODA OR ONE TUBE OF GLUCOSE GEL    STEP 2: RECHECK blood sugar in 15 MINUTES STEP 3: If your blood sugar is still low at the 15 minute recheck --> then, go back to STEP 1 and treat AGAIN with another 15 grams of carbohydrates.

## 2024-04-22 NOTE — Telephone Encounter (Signed)
 Pharmacy called needing a new script for humalog  with specific directions (how many times a day and how many units) in order for insurance to pay for it.

## 2024-04-23 LAB — SURGICAL PATHOLOGY

## 2024-04-23 MED ORDER — INSULIN LISPRO (1 UNIT DIAL) 100 UNIT/ML (KWIKPEN): 14.0000 [IU] | PEN_INJECTOR | Freq: Three times a day (TID) | SUBCUTANEOUS | 2 refills | Status: AC

## 2024-04-29 NOTE — Telephone Encounter (Signed)
 My eyes are swollen. I have a lump at the base of my skull that appeared about 5 days ago on the right side. I feel another lump on the left side of my spine not on it a few inches away. Kind of between my shoulder and spine. Are you able to assist me or do I need to go to the emergency room? I have an extremely high pain tolerance. I've had 6 babies natural but I am suffering. I can't sleep because of my arm and shoulder. It's a constant throbbing. I'm at a loss at this point.   Message from pt spoke with doc of the day  Roselie Mood suggested that pt go to ED pt states she will go in the morning to her urgent care doctor.

## 2024-04-30 ENCOUNTER — Telehealth: Payer: Self-pay

## 2024-04-30 NOTE — Telephone Encounter (Signed)
 SABRA

## 2024-05-06 ENCOUNTER — Ambulatory Visit: Payer: Self-pay | Admitting: General Surgery

## 2024-05-06 DIAGNOSIS — D242 Benign neoplasm of left breast: Secondary | ICD-10-CM

## 2024-05-10 ENCOUNTER — Telehealth (HOSPITAL_COMMUNITY): Payer: Self-pay

## 2024-05-10 NOTE — Telephone Encounter (Signed)
  ADVANCED HEART FAILURE CLINIC   Pre-operative Risk Assessment   HEARTCARE STAFF-IMPORTANT INSTRUCTIONS 1 Red and Blue Text will auto delete once note is signed or closed. 2 Press F2 to navigate through template.   3 On drop down lists, L click to select >> R click to activate next field 4 Reason for Visit format is IMPORTANT!!  See Directions on No. 2 below. 5 Please review chart to determine if there is already a clearance note open for this procedure!!  DO NOT duplicate if a note already exists!!    :1}      Request for Surgical Clearance    Procedure:  Lumpectomy  Date of Surgery:  Clearance TBD                                 Surgeon:  Deward Null Surgeon's Group or Practice Name:  [facility] Phone number:  [phone] Fax number:  [phone]   Type of Clearance Requested:   - Medical    Type of Anesthesia:  General    Additional requests/questions:  Please fax a copy of Clearance to the surgeon's office.  Signed, Lisa CHRISTELLA Sergeant   05/10/2024, 12:33 PM   [facility] Harlene Gainer, FNP [facility] [address] Heart and Vascular [place] [address] [phone] (office) [phone] (fax)

## 2024-05-13 NOTE — Progress Notes (Signed)
 ADVANCED HEART FAILURE CLINIC NOTE  Referring Physician: Billy Knee, FNP  Primary Care: Leslie Knee, FNP Primary Cardiologist: Dr. Gardenia  HPI: [name] is a 48 y.o. female with T2DM, Crohn's disease, hx of TB, tobacco use that presented to [facility] in 11/23 with N/V, elevated hs-troponin and TTE w/ newly reduced LVEF (25%); coronary angiography during admission w/o obstructive CAD and RHC w/ preserved CI. She was diuresed, started on low dose GDMT and discharged home. Shortly after starting GDMT, she had recovery of LVEF by TTE on 07/05/22.   Interval hx:  She has been doing very well from a heart failure standpoint.  Recovered EF by TTE last year.  Unfortunately recently diagnosed with SIBO leading to significant distress and GI symptoms.***  Today she returns for AHF follow up. Overall feeling ***. Denies palpitations, CP, dizziness, edema, or PND/Orthopnea. *** SOB. Appetite ok. No fever or chills. Weight at home *** pounds. Taking all medications. Denies ETOH, tobacco or drug use.   Activity level/exercise tolerance:  NYHA III but limited predominantly by underlying inflammatory disease and pain. Orthopnea:  Sleeps on 2 pillows Paroxysmal noctural dyspnea:  No Chest pain/pressure:  yes Orthostatic lightheadedness:  no Palpitations:  no Lower extremity edema:  no Presyncope/syncope:  no Cough:  no  Past Medical History:  Diagnosis Date   Anxiety    Crohn's disease (HCC)    Diabetes mellitus    DKA (diabetic ketoacidosis) (HCC)    Hypertension    Insomnia    Neuropathy     Current Outpatient Medications  Medication Sig Dispense Refill   ACCU-CHEK GUIDE test strip 1 each by Other route in the morning, at noon, in the evening, and at bedtime. Use as instructed 400 each 3   acetaminophen  (TYLENOL ) 325 MG tablet Take 650 mg by mouth every 6 (six) hours as needed for mild pain or moderate pain.     albuterol (PROVENTIL) (2.5 MG/3ML) 0.083% nebulizer solution  Inhale 2.5 mg into the lungs.     albuterol (VENTOLIN HFA) 108 (90 Base) MCG/ACT inhaler SMARTSIG:2 Puff(s) By Mouth 4 Times Daily PRN     atorvastatin  (LIPITOR ) 80 MG tablet TAKE 1 Tablet BY MOUTH ONCE DAILY at bedtime (bedtime) 90 tablet 3   Blood Glucose Monitoring Suppl (ACCU-CHEK GUIDE ME) w/Device KIT 1 Device by Does not apply route in the morning, at noon, in the evening, and at bedtime. 1 kit 0   carisoprodol (SOMA) 350 MG tablet Soma 350 mg tablet Take 1 tablet 3 times a day by oral route as needed for 7 days.     Continuous Glucose Sensor (DEXCOM G7 SENSOR) MISC 1 Device by Does not apply route as directed. 9 each 3   dapagliflozin  propanediol (FARXIGA ) 10 MG TABS tablet Take 1 tablet (10 mg total) by mouth daily. 90 tablet 3   dicyclomine (BENTYL) 10 MG capsule Take 10 mg by mouth 3 (three) times daily as needed.     ENTRESTO  24-26 MG TAKE 1 Tablet BY MOUTH TWICE DAILY (morning, bedtime) 60 tablet 11   furosemide  (LASIX ) 20 MG tablet TAKE 1 Tablet BY MOUTH ONCE DAILY (BOTTLE) 30 tablet 5   icosapent  Ethyl (VASCEPA ) 1 g capsule Take 2 capsules (2 g total) by mouth 2 (two) times daily. 360 capsule 3   Insulin  Disposable Pump (OMNIPOD 5 DEXG7G6 PODS GEN 5) MISC 1 Device by Other route every other day. 45 each 3   insulin  glargine (LANTUS  SOLOSTAR) 100 UNIT/ML Solostar Pen Inject 60  Units into the skin daily. 15 mL 3   insulin  lispro (HUMALOG  KWIKPEN) 100 UNIT/ML KwikPen Inject 14-24 Units into the skin 3 (three) times daily. Max daily 80 units per pump 80 mL 2   insulin  lispro (HUMALOG ) 100 UNIT/ML injection Max daily 100 units per pump 100 mL 11   Insulin  Pen Needle 32G X 4 MM MISC 1 Device by Does not apply route in the morning, at noon, in the evening, and at bedtime. 400 each 1   lidocaine  (LIDODERM ) 5 % Place 1 patch onto the skin daily. Remove & Discard patch within 12 hours or as directed by MD 30 patch 0   metFORMIN  (GLUCOPHAGE -XR) 500 MG 24 hr tablet Take 1 tablet (500 mg total) by  mouth 2 (two) times daily with a meal. 180 tablet 3   metoprolol  succinate (TOPROL -XL) 25 MG 24 hr tablet Take 1 tablet (25 mg total) by mouth daily. 90 tablet 3   nitroGLYCERIN  (NITROSTAT ) 0.4 MG SL tablet Place 1 tablet (0.4 mg total) under the tongue every 5 (five) minutes x 3 doses as needed for chest pain. 25 tablet 3   pantoprazole  (PROTONIX ) 40 MG tablet Take 1 tablet (40 mg total) by mouth 2 (two) times daily.     spironolactone  (ALDACTONE ) 25 MG tablet TAKE 1 Tablet BY MOUTH ONCE DAILY (MORNING) 30 tablet 5   tiZANidine  (ZANAFLEX ) 4 MG tablet tizanidine  4 mg tablet TAKE 1 TABLET BY MOUTH EVERY 8 HOURS     venlafaxine  XR (EFFEXOR  XR) 37.5 MG 24 hr capsule Take 1 capsule (37.5 mg total) by mouth daily. 90 capsule 0   XIFAXAN 550 MG TABS tablet Take 550 mg by mouth 3 (three) times daily.     zolpidem  (AMBIEN ) 10 MG tablet Take 10 mg by mouth at bedtime as needed for sleep.     No current facility-administered medications for this visit.    Allergies  Allergen Reactions   Ceclor [Cefaclor] Hives   Social History   Socioeconomic History   Marital status: Divorced    Spouse name: Not on file   Number of children: 6   Years of education: Not on file   Highest education level: Not on file  Occupational History   Occupation: Disable  Tobacco Use   Smoking status: Every Day    Current packs/day: 0.50    Types: Cigarettes   Smokeless tobacco: Never  Vaping Use   Vaping status: Former  Substance and Sexual Activity   Alcohol use: No   Drug use: Yes    Types: Marijuana    Comment: Occasional   Sexual activity: Not Currently  Other Topics Concern   Not on file  Social History Narrative   ** Merged History Encounter **       Social Drivers of Health   Financial Resource Strain: Not on file  Food Insecurity: No Food Insecurity (04/21/2022)   Hunger Vital Sign    Worried About Running Out of Food in the Last Year: Never true    Ran Out of Food in the Last Year: Never true   Transportation Needs: No Transportation Needs (04/21/2022)   PRAPARE - Administrator, Civil Service (Medical): No    Lack of Transportation (Non-Medical): No  Physical Activity: Not on file  Stress: Not on file  Social Connections: Unknown (10/19/2021)   Received from [facility]   Social Network    Social Network: Not on file  Intimate Partner Violence: Not At Risk (04/21/2022)  Humiliation, Afraid, Rape, and Kick questionnaire    Fear of Current or Ex-Partner: No    Emotionally Abused: No    Physically Abused: No    Sexually Abused: No    Family History  Problem Relation Age of Onset   Heart disease Father    Crohn's disease Father    Colon cancer Neg Hx    Rectal cancer Neg Hx    Esophageal cancer Neg Hx     PHYSICAL EXAM: There were no vitals filed for this visit. General:  *** appearing.  No respiratory difficulty Neck: JVD *** cm.  Cor: Regular rate & rhythm. No murmurs. Lungs: clear Extremities: no edema  Neuro: alert & oriented x 3. Affect pleasant.   Wt Readings from Last 3 Encounters:  04/22/24 111.6 kg (246 lb)  04/04/24 111.9 kg (246 lb 12.8 oz)  03/05/24 110.2 kg (243 lb)    DATA REVIEW  ECG: 05/02/22: NSR 5/25: ST 105  ECHO: 04/19/22: LVEF 25%-30%, G2DD, normal RV function 06/14/2022: Bedside ultrasound in clinic with normal LV function and preserved RV function. 07/05/22: normal LV function  CATH: RHC/LHC, 04/21/22:    Prox LAD lesion is 25% stenosed.   Mid LAD lesion is 30% stenosed.   Prox RCA lesion is 50% stenosed.   There is severe left ventricular systolic dysfunction.   LV end diastolic pressure is normal.   The left ventricular ejection fraction is less than 25% by visual estimate.  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 No significant FH of heart failure. Recovery of LVEF on TTE from 07/05/22.  NYHA  class / AHA Stage:III, I believe her underlying gastroparesis and Crohn's/inflammatory disease is a large component of her functional limitations. Volume status & Diuretics:  Euvolemic on exam; taking lasix  20mg  daily.  Vasodilators : Continue Entresto  24/26 mg twice daily Beta-Blocker: Continue toprol  25mg  daily.  MRA: Continue 25mg  daily.  Cardiometabolic:Continue farxiga  10mg  daily Devices therapies & Valvulopathies:not indicated Advanced therapies: Not indicated.   2. Crohn's Disease / SIBO - continues to struggle with fatigue   3. Hx of TB - Reports that she was briefly treated, however, could not complete treatment due to rise in LFTs and significant difficulty with associated symptoms?  - Reportedly no longer a candidate for biologics for Crohn's management due to underlying hx of untreated latent TB.  4. Obesity  - There is no height or weight on file to calculate BMI. - Now on ozempic ***  5. Hypertension - BP at goal today; repeat BMP/BNP, continue Entresto  ***  6. T2DM - Now on insulin  pump - A1C10 on 07/05/23 - A1c 8.8   [name] AGACNP-BC  05/13/24  [facility] [place] [address] Heart and Vascular [place] [address] [phone] (office)

## 2024-05-14 ENCOUNTER — Telehealth (HOSPITAL_COMMUNITY): Payer: Self-pay

## 2024-05-14 NOTE — Telephone Encounter (Signed)
 Called to confirm/remind patient of their appointment at the [facility] on 05/14/2024.   Appointment:   [x] Confirmed  [] Left mess   [] No answer/No voice mail  [] VM Full/unable to leave message  [] Phone not in service  Patient reminded to bring all medications and/or complete list.  Confirmed patient has transportation. Gave directions, instructed to utilize valet parking.

## 2024-05-15 ENCOUNTER — Ambulatory Visit (HOSPITAL_COMMUNITY): Admission: RE | Admit: 2024-05-15 | Discharge: 2024-05-15 | Attending: Internal Medicine

## 2024-05-15 ENCOUNTER — Encounter (HOSPITAL_COMMUNITY): Payer: Self-pay

## 2024-05-15 ENCOUNTER — Ambulatory Visit (HOSPITAL_COMMUNITY): Payer: Self-pay | Admitting: Internal Medicine

## 2024-05-15 VITALS — BP 128/78 | HR 111 | Ht 65.0 in | Wt 240.0 lb

## 2024-05-15 DIAGNOSIS — Z8615 Personal history of latent tuberculosis infection: Secondary | ICD-10-CM | POA: Insufficient documentation

## 2024-05-15 DIAGNOSIS — I428 Other cardiomyopathies: Secondary | ICD-10-CM | POA: Insufficient documentation

## 2024-05-15 DIAGNOSIS — I5022 Chronic systolic (congestive) heart failure: Secondary | ICD-10-CM | POA: Diagnosis not present

## 2024-05-15 DIAGNOSIS — Z7984 Long term (current) use of oral hypoglycemic drugs: Secondary | ICD-10-CM | POA: Insufficient documentation

## 2024-05-15 DIAGNOSIS — E669 Obesity, unspecified: Secondary | ICD-10-CM | POA: Insufficient documentation

## 2024-05-15 DIAGNOSIS — Z79899 Other long term (current) drug therapy: Secondary | ICD-10-CM | POA: Insufficient documentation

## 2024-05-15 DIAGNOSIS — E1121 Type 2 diabetes mellitus with diabetic nephropathy: Secondary | ICD-10-CM | POA: Diagnosis not present

## 2024-05-15 DIAGNOSIS — I10 Essential (primary) hypertension: Secondary | ICD-10-CM | POA: Diagnosis not present

## 2024-05-15 DIAGNOSIS — E1143 Type 2 diabetes mellitus with diabetic autonomic (poly)neuropathy: Secondary | ICD-10-CM | POA: Insufficient documentation

## 2024-05-15 DIAGNOSIS — Z9641 Presence of insulin pump (external) (internal): Secondary | ICD-10-CM | POA: Insufficient documentation

## 2024-05-15 DIAGNOSIS — R079 Chest pain, unspecified: Secondary | ICD-10-CM | POA: Diagnosis not present

## 2024-05-15 DIAGNOSIS — R0602 Shortness of breath: Secondary | ICD-10-CM | POA: Insufficient documentation

## 2024-05-15 DIAGNOSIS — I110 Hypertensive heart disease with heart failure: Secondary | ICD-10-CM | POA: Insufficient documentation

## 2024-05-15 DIAGNOSIS — F17210 Nicotine dependence, cigarettes, uncomplicated: Secondary | ICD-10-CM | POA: Insufficient documentation

## 2024-05-15 DIAGNOSIS — R5383 Other fatigue: Secondary | ICD-10-CM | POA: Insufficient documentation

## 2024-05-15 DIAGNOSIS — K3184 Gastroparesis: Secondary | ICD-10-CM | POA: Insufficient documentation

## 2024-05-15 DIAGNOSIS — Z6839 Body mass index (BMI) 39.0-39.9, adult: Secondary | ICD-10-CM | POA: Insufficient documentation

## 2024-05-15 DIAGNOSIS — Z01818 Encounter for other preprocedural examination: Secondary | ICD-10-CM | POA: Diagnosis not present

## 2024-05-15 DIAGNOSIS — K5090 Crohn's disease, unspecified, without complications: Secondary | ICD-10-CM | POA: Insufficient documentation

## 2024-05-15 DIAGNOSIS — K219 Gastro-esophageal reflux disease without esophagitis: Secondary | ICD-10-CM | POA: Insufficient documentation

## 2024-05-15 DIAGNOSIS — Z794 Long term (current) use of insulin: Secondary | ICD-10-CM | POA: Insufficient documentation

## 2024-05-15 LAB — BASIC METABOLIC PANEL WITH GFR
Anion gap: 13 (ref 5–15)
BUN: 11 mg/dL (ref 6–20)
CO2: 21 mmol/L — ABNORMAL LOW (ref 22–32)
Calcium: 9.3 mg/dL (ref 8.9–10.3)
Chloride: 99 mmol/L (ref 98–111)
Creatinine, Ser: 0.64 mg/dL (ref 0.44–1.00)
GFR, Estimated: >60 mL/min (ref >60)
Glucose, Bld: 418 mg/dL — ABNORMAL HIGH (ref 70–99)
Potassium: 4.5 mmol/L (ref 3.5–5.1)
Sodium: 133 mmol/L — ABNORMAL LOW (ref 135–145)

## 2024-05-15 LAB — BRAIN NATRIURETIC PEPTIDE: B Natriuretic Peptide: 5.3 pg/mL (ref 0.0–100.0)

## 2024-05-15 MED ORDER — METOPROLOL SUCCINATE ER 50 MG PO TB24: 50.0000 mg | ORAL_TABLET | Freq: Every day | ORAL | 3 refills | Status: AC

## 2024-05-15 MED ORDER — FUROSEMIDE 20 MG PO TABS: 20.0000 mg | ORAL_TABLET | Freq: Every day | ORAL | 5 refills | Status: AC | PRN

## 2024-05-15 MED ORDER — LOSARTAN POTASSIUM 25 MG PO TABS: 25.0000 mg | ORAL_TABLET | Freq: Every day | ORAL | 5 refills | Status: AC

## 2024-05-15 NOTE — Patient Instructions (Addendum)
 Medication Changes:  CHANGE LASIX  (FUROSEMIDE ) TO 20MG  ONCE DAILY AS NEEDED   STOP ENTRESTO    START LOSARTAN 25MG  ONCE DAILY   INCREASE METOPROLOL  SUCCINATE TO 50MG  ONCE DAILY   PLEASE CALL PHARMACY TO ENSURE CHANGES TO PILL PACK   Lab Work:  Labs done today, your results will be available in MyChart, we will contact you for abnormal readings.  Testing/Procedures:  ECHOCARDIOGRAM AS SCHEDULED   Follow-Up in: 4 WEEKS AS SCHEDULED WITH DR. ZENAIDA   At [facility], you and your health needs are our priority. We have a designated team specialized in the treatment of Heart Failure. This Care Team includes your primary Heart Failure Specialized Cardiologist (physician), Advanced Practice Providers (APPs- Physician Assistants and Nurse Practitioners), and Pharmacist who all work together to provide you with the care you need, when you need it.   You may see any of the following providers on your designated Care Team at your next follow up:  Dr. Toribio Fuel Dr. Ezra Shuck Dr. Odis Zenaida Greig Mosses, NP Caffie Shed, [place] [facility] [place], [place] Beckey Coe, NP Jordan Lee, NP Tinnie Redman, PharmD   Please be sure to bring in all your medications bottles to every appointment.   Need to Contact Us :  If you have any questions or concerns before your next appointment please send us  a message through [place] or call our office at [phone].    TO LEAVE A MESSAGE FOR THE NURSE SELECT OPTION 2, PLEASE LEAVE A MESSAGE INCLUDING: YOUR NAME DATE OF BIRTH CALL BACK NUMBER REASON FOR CALL**this is important as we prioritize the call backs  YOU WILL RECEIVE A CALL BACK THE SAME DAY AS LONG AS YOU CALL BEFORE 4:00 PM

## 2024-05-16 ENCOUNTER — Ambulatory Visit: Admitting: Internal Medicine

## 2024-05-24 ENCOUNTER — Encounter: Payer: Self-pay | Admitting: Internal Medicine

## 2024-05-24 ENCOUNTER — Telehealth: Payer: Self-pay

## 2024-05-24 MED ORDER — DEXCOM G7 SENSOR MISC: Status: DC

## 2024-05-24 NOTE — Telephone Encounter (Signed)
 Pt came into the office and pick up 2 dexcoms.

## 2024-05-24 NOTE — Telephone Encounter (Signed)
 Sample  Device/Supplies: Dexcom G7 Quantity:2 ONU:[id number]  [id number] EXP:07/06/2025     07/06/25

## 2024-06-05 LAB — OPHTHALMOLOGY REPORT-SCANNED

## 2024-06-07 ENCOUNTER — Ambulatory Visit (HOSPITAL_COMMUNITY): Admission: RE | Admit: 2024-06-07 | Source: Ambulatory Visit

## 2024-06-13 ENCOUNTER — Other Ambulatory Visit: Payer: Self-pay | Admitting: Internal Medicine

## 2024-06-26 NOTE — Progress Notes (Signed)
 Pt left before being seen by MD. OCT and FA testing completed and interpreted.  Redell JUDITHANN Hans, M.D., Ph.D. Diseases & Surgery of the Retina and Vitreous [facility] 06/28/2024

## 2024-06-27 ENCOUNTER — Ambulatory Visit (HOSPITAL_COMMUNITY)

## 2024-06-28 ENCOUNTER — Encounter (INDEPENDENT_AMBULATORY_CARE_PROVIDER_SITE_OTHER): Payer: Self-pay

## 2024-06-28 ENCOUNTER — Encounter (INDEPENDENT_AMBULATORY_CARE_PROVIDER_SITE_OTHER): Payer: Self-pay | Admitting: Ophthalmology

## 2024-06-28 ENCOUNTER — Ambulatory Visit (INDEPENDENT_AMBULATORY_CARE_PROVIDER_SITE_OTHER): Admitting: Ophthalmology

## 2024-06-28 VITALS — BP 123/76 | HR 100

## 2024-06-28 DIAGNOSIS — H3581 Retinal edema: Secondary | ICD-10-CM

## 2024-06-28 DIAGNOSIS — E113313 Type 2 diabetes mellitus with moderate nonproliferative diabetic retinopathy with macular edema, bilateral: Secondary | ICD-10-CM | POA: Diagnosis not present

## 2024-06-28 DIAGNOSIS — Z794 Long term (current) use of insulin: Secondary | ICD-10-CM

## 2024-06-28 DIAGNOSIS — H35033 Hypertensive retinopathy, bilateral: Secondary | ICD-10-CM | POA: Diagnosis not present

## 2024-06-28 DIAGNOSIS — Z7984 Long term (current) use of oral hypoglycemic drugs: Secondary | ICD-10-CM | POA: Diagnosis not present

## 2024-06-28 DIAGNOSIS — I10 Essential (primary) hypertension: Secondary | ICD-10-CM

## 2024-07-09 ENCOUNTER — Encounter: Payer: Self-pay | Admitting: Internal Medicine

## 2024-07-09 ENCOUNTER — Other Ambulatory Visit: Payer: Self-pay | Admitting: Internal Medicine

## 2024-07-09 DIAGNOSIS — F419 Anxiety disorder, unspecified: Secondary | ICD-10-CM

## 2024-07-09 DIAGNOSIS — F32A Depression, unspecified: Secondary | ICD-10-CM

## 2024-07-09 MED ORDER — OMNIPOD 5 DEXG7G6 PODS GEN 5 MISC: 1.0000 | 3 refills | Status: AC

## 2024-07-09 MED ORDER — DEXCOM G7 SENSOR MISC: 1.0000 | 3 refills | Status: AC

## 2024-07-10 NOTE — Telephone Encounter (Signed)
 Patient notified medication is ready via MyChart.

## 2024-07-12 ENCOUNTER — Ambulatory Visit (HOSPITAL_COMMUNITY): Admission: RE | Admit: 2024-07-12 | Source: Ambulatory Visit

## 2024-07-12 DIAGNOSIS — I5022 Chronic systolic (congestive) heart failure: Secondary | ICD-10-CM

## 2024-07-12 LAB — ECHOCARDIOGRAM COMPLETE
Area-P 1/2: 5.27 cm2
Calc EF: 58.9 %
S' Lateral: 3.4 cm
Single Plane A2C EF: 55 %
Single Plane A4C EF: 60.2 %

## 2024-07-12 NOTE — Progress Notes (Signed)
" °  Echocardiogram 2D Echocardiogram has been performed.  Koleen KANDICE Popper, RDCS 07/12/2024, 9:39 AM "

## 2024-07-12 NOTE — Telephone Encounter (Addendum)
 Pt aware, agreeable, and verbalized understanding   ----- Message from Beckey Coe, NP sent at 07/12/2024 11:49 AM EST ----- Echo normal. EF 55-60%.

## 2024-07-31 ENCOUNTER — Ambulatory Visit: Admitting: Physician Assistant

## 2024-08-20 ENCOUNTER — Ambulatory Visit: Admitting: Internal Medicine
